# Patient Record
Sex: Female | Born: 1995 | Race: Black or African American | Hispanic: No | Marital: Single | Smoking: Former smoker
Health system: Southern US, Community
[De-identification: ages and names within clinical notes are randomized; demographics above are authoritative.]

## PROBLEM LIST (undated history)

## (undated) ENCOUNTER — Emergency Department (HOSPITAL_COMMUNITY): Admission: EM | Payer: Self-pay

## (undated) ENCOUNTER — Inpatient Hospital Stay (HOSPITAL_COMMUNITY): Payer: Self-pay

## (undated) DIAGNOSIS — D219 Benign neoplasm of connective and other soft tissue, unspecified: Secondary | ICD-10-CM

## (undated) DIAGNOSIS — Z8679 Personal history of other diseases of the circulatory system: Secondary | ICD-10-CM

## (undated) DIAGNOSIS — I4711 Inappropriate sinus tachycardia, so stated: Secondary | ICD-10-CM

## (undated) DIAGNOSIS — E282 Polycystic ovarian syndrome: Secondary | ICD-10-CM

## (undated) DIAGNOSIS — N390 Urinary tract infection, site not specified: Secondary | ICD-10-CM

## (undated) DIAGNOSIS — Z8759 Personal history of other complications of pregnancy, childbirth and the puerperium: Secondary | ICD-10-CM

## (undated) DIAGNOSIS — F32A Depression, unspecified: Secondary | ICD-10-CM

## (undated) DIAGNOSIS — R000 Tachycardia, unspecified: Secondary | ICD-10-CM

## (undated) HISTORY — DX: Depression, unspecified: F32.A

## (undated) HISTORY — DX: Benign neoplasm of connective and other soft tissue, unspecified: D21.9

## (undated) HISTORY — DX: Personal history of other diseases of the circulatory system: Z86.79

## (undated) HISTORY — DX: Personal history of other diseases of the circulatory system: Z87.59

## (undated) HISTORY — DX: Polycystic ovarian syndrome: E28.2

## (undated) HISTORY — PX: WISDOM TOOTH EXTRACTION: SHX21

## (undated) HISTORY — PX: CHOLECYSTECTOMY: SHX55

---
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
## 2015-09-17 DIAGNOSIS — A599 Trichomoniasis, unspecified: Secondary | ICD-10-CM

## 2015-09-17 HISTORY — DX: Trichomoniasis, unspecified: A59.9

## 2015-12-14 DIAGNOSIS — F4310 Post-traumatic stress disorder, unspecified: Secondary | ICD-10-CM | POA: Insufficient documentation

## 2015-12-14 HISTORY — DX: Post-traumatic stress disorder, unspecified: F43.10

## 2018-01-11 DIAGNOSIS — F339 Major depressive disorder, recurrent, unspecified: Secondary | ICD-10-CM | POA: Insufficient documentation

## 2020-07-05 DIAGNOSIS — R87612 Low grade squamous intraepithelial lesion on cytologic smear of cervix (LGSIL): Secondary | ICD-10-CM | POA: Insufficient documentation

## 2020-07-05 DIAGNOSIS — E282 Polycystic ovarian syndrome: Secondary | ICD-10-CM | POA: Insufficient documentation

## 2021-09-16 NOTE — L&D Delivery Note (Signed)
Delivery Note After a nearly 2 hour 2nd stage, at 10:01 PM a viable female was delivered via Vaginal, Spontaneous (Presentation: OA). The shoulders were not forthcoming, so the posterior (right) axilla was grasped with my index finger, and the baby was rotated clockwise into the oblique diameter.  At this point, the (now) anterior shoulder was released, and the baby delivered.  At no time was any traction placed on the baby's head.  APGAR: 2, 6, & 7.  weight 8 # 13 oz . After about 30 seconds, d/t the baby appearing to need extra stimulation, the cord was clamped and cut. 20 units of pitocin diluted in 500cc LR was infused rapidly IV.  The placenta separated spontaneously and delivered via CCT and maternal pushing effort.  It was inspected and appears to be intact with a 3 VC.  An in and out catheter was used to drain the bladder of ~ 100cc urine.   Cord pH: 7.11  Anesthesia: Epidural Episiotomy:  none Lacerations:  none Suture Repair: n/a Est. Blood Loss (mL):  308  Mom to postpartum.  Baby to NICU.  Scarlette Calico Cresenzo-Dishmon 08/26/2022, 10:19 PM

## 2022-01-10 ENCOUNTER — Inpatient Hospital Stay (HOSPITAL_COMMUNITY): Payer: Medicaid Other

## 2022-01-10 ENCOUNTER — Encounter (HOSPITAL_COMMUNITY): Payer: Self-pay | Admitting: Obstetrics and Gynecology

## 2022-01-10 ENCOUNTER — Inpatient Hospital Stay (HOSPITAL_COMMUNITY)
Admission: AD | Admit: 2022-01-10 | Discharge: 2022-01-10 | Disposition: A | Discharge status: Home / Self Care | Payer: Medicaid Other | Attending: Obstetrics and Gynecology | Admitting: Obstetrics and Gynecology

## 2022-01-10 DIAGNOSIS — O26891 Other specified pregnancy related conditions, first trimester: Secondary | ICD-10-CM | POA: Diagnosis present

## 2022-01-10 DIAGNOSIS — Z3A01 Less than 8 weeks gestation of pregnancy: Secondary | ICD-10-CM | POA: Insufficient documentation

## 2022-01-10 DIAGNOSIS — O2341 Unspecified infection of urinary tract in pregnancy, first trimester: Secondary | ICD-10-CM | POA: Insufficient documentation

## 2022-01-10 DIAGNOSIS — Z3490 Encounter for supervision of normal pregnancy, unspecified, unspecified trimester: Secondary | ICD-10-CM

## 2022-01-10 DIAGNOSIS — Z87440 Personal history of urinary (tract) infections: Secondary | ICD-10-CM | POA: Diagnosis not present

## 2022-01-10 LAB — CBC
HCT: 39.6 % (ref 36.0–46.0)
Hemoglobin: 13.9 g/dL (ref 12.0–15.0)
MCH: 30.1 pg (ref 26.0–34.0)
MCHC: 35.1 g/dL (ref 30.0–36.0)
MCV: 85.7 fL (ref 80.0–100.0)
Platelets: 349 10*3/uL (ref 150–400)
RBC: 4.62 MIL/uL (ref 3.87–5.11)
RDW: 12.3 % (ref 11.5–15.5)
WBC: 8.2 10*3/uL (ref 4.0–10.5)
nRBC: 0 % (ref 0.0–0.2)

## 2022-01-10 LAB — HCG, QUANTITATIVE, PREGNANCY: hCG, Beta Chain, Quant, S: 11266 m[IU]/mL — ABNORMAL HIGH (ref <5)

## 2022-01-10 LAB — ABO/RH: ABO/RH(D): B POS

## 2022-01-10 NOTE — MAU Note (Signed)
Kimberly Lewis is a 26 y.o. here in MAU reporting: went to UC on 4/25.  Found out she was preg.  Was told she UTI. Was told the medication might not work.  Was told to f/u to see if meds are working and an [place] to make sure not an ectopic preg.  Not really feeling the cramping now since on antibiotics, urinary symptoms have also improved.  Denies vag bleeding. ?LMP: 3/16 ?Onset of complaint: 2 wks ago cramping, frequency/urgency of preg.  ?Pain score: more of an irritant than pain ?Vitals:  ? 01/10/22 1212  ?BP: 124/61  ?Pulse: 60  ?Resp: 17  ?Temp: 99 ?F (37.2 ?C)  ?SpO2: 99%  ?   ?Lab orders placed from triage:  urine obtained, not ordered ?

## 2022-01-10 NOTE — Discharge Instructions (Signed)

## 2022-01-10 NOTE — MAU Provider Note (Signed)
?History  ?  ? ?CSN: [id number] ? ?Arrival date and time: 01/10/22 1129 ? ? Event Date/Time  ? First Provider Initiated Contact with Patient 01/10/22 1420   ?  ? ?Chief Complaint  ?Patient presents with  ? UTI f/u  ? ?HPI ?Kimberly Lewis is a 26 y.o. G1P0 at [redacted]w[redacted]d who presents to MAU with chief complaint of abdominal pain 2/2 recent UTI diagnosis. Patient endorses intermittent "sharp" abdominal pain. Most recent episode of pain was yesterday. She started the abx prescribed for her UTI yesterday. She denies vaginal bleeding, back pain, fever. ? ?OB History   ? ? Gravida  ?1  ? Para  ?   ? Term  ?   ? Preterm  ?   ? AB  ?   ? Living  ?   ?  ? ? SAB  ?   ? IAB  ?   ? Ectopic  ?   ? Multiple  ?   ? Live Births  ?   ?   ?  ?  ? ? ?No past medical history on file. ? ?No past surgical history on file. ? ?No family history on file. ? ?  ? ?Allergies: No Known Allergies ? ?Medications Prior to Admission  ?Medication Sig Dispense Refill Last Dose  ? albuterol (PROVENTIL,VENTOLIN) 90 MCG/ACT inhaler Inhale 2 puffs into the lungs 2 (two) times daily as needed. For shortness of breath ?      ? diphenhydrAMINE (BENADRYL) 25 MG tablet Take 25 mg by mouth 2 (two) times daily as needed. For allergic reaction      ? ? ?Review of Systems  ?Gastrointestinal:  Positive for abdominal pain.  ?All other systems reviewed and are negative. ?Physical Exam  ? ?Blood pressure 124/61, pulse 60, temperature 99 ?F (37.2 ?C), temperature source Oral, resp. rate 17, height 5\' 1"  (1.549 m), weight 87.5 kg, last menstrual period 11/29/2021, SpO2 99 %. ? ?Physical Exam ?Vitals and nursing note reviewed. Exam conducted with a chaperone present.  ?Constitutional:   ?   Appearance: Normal appearance.  ?Cardiovascular:  ?   Rate and Rhythm: Normal rate.  ?   Pulses: Normal pulses.  ?Pulmonary:  ?   Effort: Pulmonary effort is normal.  ?   Breath sounds: Normal breath sounds.  ?Abdominal:  ?   Tenderness: There is no abdominal tenderness. There is no left  CVA tenderness.  ?Skin: ?   Capillary Refill: Capillary refill takes less than 2 seconds.  ?Neurological:  ?   Mental Status: She is alert and oriented to person, place, and time.  ?Psychiatric:     ?   Mood and Affect: Mood normal.     ?   Behavior: Behavior normal.     ?   Thought Content: Thought content normal.     ?   Judgment: Judgment normal.  ? ? ?MAU Course  ?Procedures ? ?MDM ?Orders Placed This Encounter  ?Procedures  ? 12/01/2021 OB LESS THAN 14 WEEKS WITH OB TRANSVAGINAL  ? CBC  ? hCG, quantitative, pregnancy  ? ABO/Rh  ? Discharge patient  ? ?Patient Vitals for the past 24 hrs: ? BP Temp Temp src Pulse Resp SpO2 Height Weight  ?01/10/22 1212 124/61 99 ?F (37.2 ?C) Oral 60 17 99 % 5\' 1"  (1.549 m) 87.5 kg  ? ?Results for orders placed or performed during the hospital encounter of 01/10/22 (from the past 24 hour(s))  ?CBC     Status: None  ? Collection Time: 01/10/22  12:43 PM  ?Result Value Ref Range  ? WBC 8.2 4.0 - 10.5 K/uL  ? RBC 4.62 3.87 - 5.11 MIL/uL  ? Hemoglobin 13.9 12.0 - 15.0 g/dL  ? HCT 39.6 36.0 - 46.0 %  ? MCV 85.7 80.0 - 100.0 fL  ? MCH 30.1 26.0 - 34.0 pg  ? MCHC 35.1 30.0 - 36.0 g/dL  ? RDW 12.3 11.5 - 15.5 %  ? Platelets 349 150 - 400 K/uL  ? nRBC 0.0 0.0 - 0.2 %  ?hCG, quantitative, pregnancy     Status: Abnormal  ? Collection Time: 01/10/22 12:43 PM  ?Result Value Ref Range  ? hCG, Beta Chain, Quant, S 11,266 (H) <5 mIU/mL  ?ABO/Rh     Status: None  ? Collection Time: 01/10/22 12:43 PM  ?Result Value Ref Range  ? ABO/RH(D) B POS   ? No rh immune globuloin    ?  NOT A RH IMMUNE GLOBULIN CANDIDATE, PT RH POSITIVE ?Performed at [facility], [address], [place], [place] [address] ?  ? ?US OB LESS THAN 14 WEEKS WITH OB TRANSVAGINAL ? ?Result Date: 01/10/2022 ?CLINICAL DATA:  Pregnancy.  Abdominal cramping EXAM: OBSTETRIC <14 WK [place] AND TRANSVAGINAL OB US TECHNIQUE: Both transabdominal and transvaginal ultrasound examinations were performed for complete evaluation of the gestation as well  as the maternal uterus, adnexal regions, and pelvic cul-de-sac. Transvaginal technique was performed to assess early pregnancy. COMPARISON:  None. FINDINGS: Intrauterine gestational sac: Single Yolk sac:  Visualized. Embryo:  Not Visualized. Cardiac Activity: Not Visualized. MSD: 8.4 mm   5 w   4 d Subchorionic hemorrhage:  None visualized. Maternal uterus/adnexae: Bilateral ovaries within normal limits. No adnexal abnormality. No free fluid within the pelvis. IMPRESSION: Early intrauterine gestational sac containing yolk sac. No embryo is visualized at this time. By mean sac diameter, pregnancy measures 5 weeks 4 days. Recommend follow-up quantitative B-HCG levels and follow-up US in 14 days to assess viability. This recommendation follows SRU consensus guidelines: Diagnostic Criteria for Nonviable Pregnancy Early in the First Trimester. Malva Limes Med 2013; 224:8250-03. Electronically Signed   By: Duanne Guess D.O.   On: 01/10/2022 14:14   ? ?Assessment and Plan  ?--26 y.o. G1P0 with confirmed IUP (+ GS, + YS) ?--PNV prescribed by outside provider ?--Finish abx for UTI as previously prescribed ?--Discharge home in stable condition ? ?Calvert Cantor, CNM ?01/10/2022, 3:19 PM  ?

## 2022-01-13 ENCOUNTER — Inpatient Hospital Stay (HOSPITAL_COMMUNITY)
Admission: AD | Admit: 2022-01-13 | Discharge: 2022-01-13 | Disposition: A | Discharge status: Home / Self Care | Payer: Medicaid Other | Attending: Obstetrics & Gynecology | Admitting: Obstetrics & Gynecology

## 2022-01-13 DIAGNOSIS — Z3401 Encounter for supervision of normal first pregnancy, first trimester: Secondary | ICD-10-CM | POA: Diagnosis not present

## 2022-01-13 DIAGNOSIS — Z3491 Encounter for supervision of normal pregnancy, unspecified, first trimester: Secondary | ICD-10-CM | POA: Insufficient documentation

## 2022-01-13 DIAGNOSIS — Z711 Person with feared health complaint in whom no diagnosis is made: Secondary | ICD-10-CM

## 2022-01-13 DIAGNOSIS — Z3A01 Less than 8 weeks gestation of pregnancy: Secondary | ICD-10-CM | POA: Diagnosis not present

## 2022-01-13 NOTE — MAU Note (Signed)
Kimberly Lewis is a 26 y.o. at [redacted]w[redacted]d here in MAU reporting: went to ER yesterday in [place].  For stomach cramping and irritation.  Was told to come in have another [place] , because they weren't sure but they thought she was having a miscarriage. cramping has now stopped, no bleeding.  ?Currently on meds for UTI, also found out she had trich- was treated.  ?Onset of complaint: ? Follow up ?Pain score: no ?Vitals:  ? 01/13/22 1539  ?BP: 119/78  ?Pulse: 76  ?Resp: 18  ?Temp: 99 ?F (37.2 ?C)  ?SpO2: 99%  ?   ? ?Lab orders placed from triage:  none ?

## 2022-01-13 NOTE — MAU Provider Note (Signed)
Event Date/Time  ? First Provider Initiated Contact with Patient 01/13/22 1545   ?  ? ?S ?[name] is a 26 y.o. G1P0 patient who presents to MAU today with complaint requesting follow up from her emergency room visit. She states she was seen in the ER in [place] yesterday and told to follow up with OB due to a pregnancy of uncertain viability. She denies any pain or bleeding today.  ? ?O ?BP 119/78 (BP Location: Right Arm)   Pulse 76   Temp 99 ?F (37.2 ?C) (Oral)   Resp 18   LMP 11/29/2021   SpO2 99%  ?Physical Exam ?Vitals and nursing note reviewed.  ?Constitutional:   ?   General: She is not in acute distress. ?   Appearance: She is well-developed.  ?HENT:  ?   Head: Normocephalic.  ?Eyes:  ?   Pupils: Pupils are equal, round, and reactive to light.  ?Cardiovascular:  ?   Rate and Rhythm: Normal rate and regular rhythm.  ?   Heart sounds: Normal heart sounds.  ?Pulmonary:  ?   Effort: Pulmonary effort is normal. No respiratory distress.  ?   Breath sounds: Normal breath sounds.  ?Abdominal:  ?   General: Bowel sounds are normal. There is no distension.  ?   Palpations: Abdomen is soft.  ?   Tenderness: There is no abdominal tenderness.  ?Skin: ?   General: Skin is warm and dry.  ?Neurological:  ?   Mental Status: She is alert and oriented to person, place, and time.  ?Psychiatric:     ?   Mood and Affect: Mood normal.     ?   Behavior: Behavior normal.     ?   Thought Content: Thought content normal.     ?   Judgment: Judgment normal.  ? ? ?A ?Medical screening exam complete ?1. Normal intrauterine pregnancy on prenatal ultrasound in first trimester   ?2. [redacted] weeks gestation of pregnancy   ?3. Physically well but worried   ? ?Review of records showed appropriate growth in pregnancy between ultrasound on 4/27 and 4/29. Reassurance provided to patient and appropriate follow up reviewed. Patient agreeable to plan of care ? ?P ?Discharge from MAU in stable condition ?Order placed for outpatient  ultrasound in 10 days. ?Warning signs for worsening condition that would warrant emergency follow-up discussed ?Patient may return to MAU as needed  ? ?Rolm Bookbinder, CNM ?01/13/2022 3:45 PM   ?

## 2022-01-13 NOTE — Discharge Instructions (Signed)

## 2022-01-24 ENCOUNTER — Ambulatory Visit (HOSPITAL_COMMUNITY)
Admission: RE | Admit: 2022-01-24 | Discharge: 2022-01-24 | Disposition: A | Discharge status: Home / Self Care | Payer: Medicaid Other | Source: Ambulatory Visit

## 2022-01-24 DIAGNOSIS — O3481 Maternal care for other abnormalities of pelvic organs, first trimester: Secondary | ICD-10-CM | POA: Insufficient documentation

## 2022-01-24 DIAGNOSIS — N8311 Corpus luteum cyst of right ovary: Secondary | ICD-10-CM | POA: Diagnosis not present

## 2022-01-24 DIAGNOSIS — Z3689 Encounter for other specified antenatal screening: Secondary | ICD-10-CM | POA: Diagnosis present

## 2022-01-24 DIAGNOSIS — Z3A01 Less than 8 weeks gestation of pregnancy: Secondary | ICD-10-CM | POA: Insufficient documentation

## 2022-01-24 DIAGNOSIS — Z3491 Encounter for supervision of normal pregnancy, unspecified, first trimester: Secondary | ICD-10-CM | POA: Insufficient documentation

## 2022-01-26 ENCOUNTER — Telehealth: Payer: Self-pay

## 2022-01-26 NOTE — Telephone Encounter (Signed)
Attempted to call patient to review ultrasound results with no answer. HIPPA compliant message left and will attempt to call patient back at another time. ? ?Wende Mott, CNM ?01/26/22 ?8:58 AM ? ?

## 2022-01-27 ENCOUNTER — Encounter (HOSPITAL_COMMUNITY): Payer: Self-pay | Admitting: Obstetrics & Gynecology

## 2022-01-27 ENCOUNTER — Inpatient Hospital Stay (HOSPITAL_COMMUNITY)
Admission: AD | Admit: 2022-01-27 | Discharge: 2022-01-28 | Disposition: A | Discharge status: Home / Self Care | Payer: Medicaid Other | Attending: Obstetrics & Gynecology | Admitting: Obstetrics & Gynecology

## 2022-01-27 DIAGNOSIS — R9431 Abnormal electrocardiogram [ECG] [EKG]: Secondary | ICD-10-CM | POA: Insufficient documentation

## 2022-01-27 DIAGNOSIS — R42 Dizziness and giddiness: Secondary | ICD-10-CM | POA: Diagnosis not present

## 2022-01-27 DIAGNOSIS — O26891 Other specified pregnancy related conditions, first trimester: Secondary | ICD-10-CM | POA: Diagnosis present

## 2022-01-27 DIAGNOSIS — O219 Vomiting of pregnancy, unspecified: Secondary | ICD-10-CM | POA: Diagnosis not present

## 2022-01-27 DIAGNOSIS — Z3A08 8 weeks gestation of pregnancy: Secondary | ICD-10-CM | POA: Diagnosis not present

## 2022-01-27 DIAGNOSIS — R519 Headache, unspecified: Secondary | ICD-10-CM | POA: Diagnosis not present

## 2022-01-27 HISTORY — DX: Tachycardia, unspecified: R00.0

## 2022-01-27 LAB — URINALYSIS, ROUTINE W REFLEX MICROSCOPIC
Bilirubin Urine: NEGATIVE
Glucose, UA: NEGATIVE mg/dL
Hgb urine dipstick: NEGATIVE
Ketones, ur: 20 mg/dL — AB
Leukocytes,Ua: NEGATIVE
Nitrite: NEGATIVE
Protein, ur: NEGATIVE mg/dL
Specific Gravity, Urine: 1.015 (ref 1.005–1.030)
pH: 6 (ref 5.0–8.0)

## 2022-01-27 LAB — CBC WITH DIFFERENTIAL/PLATELET
Abs Immature Granulocytes: 0.03 10*3/uL (ref 0.00–0.07)
Basophils Absolute: 0 10*3/uL (ref 0.0–0.1)
Basophils Relative: 1 %
Eosinophils Absolute: 0.1 10*3/uL (ref 0.0–0.5)
Eosinophils Relative: 1 %
HCT: 42.9 % (ref 36.0–46.0)
Hemoglobin: 15.3 g/dL — ABNORMAL HIGH (ref 12.0–15.0)
Immature Granulocytes: 0 %
Lymphocytes Relative: 30 %
Lymphs Abs: 2.6 10*3/uL (ref 0.7–4.0)
MCH: 29.8 pg (ref 26.0–34.0)
MCHC: 35.7 g/dL (ref 30.0–36.0)
MCV: 83.6 fL (ref 80.0–100.0)
Monocytes Absolute: 0.7 10*3/uL (ref 0.1–1.0)
Monocytes Relative: 8 %
Neutro Abs: 5.2 10*3/uL (ref 1.7–7.7)
Neutrophils Relative %: 60 %
Platelets: 384 10*3/uL (ref 150–400)
RBC: 5.13 MIL/uL — ABNORMAL HIGH (ref 3.87–5.11)
RDW: 11.9 % (ref 11.5–15.5)
WBC: 8.7 10*3/uL (ref 4.0–10.5)
nRBC: 0 % (ref 0.0–0.2)

## 2022-01-27 LAB — COMPREHENSIVE METABOLIC PANEL
ALT: 25 U/L (ref 0–44)
AST: 23 U/L (ref 15–41)
Albumin: 4.3 g/dL (ref 3.5–5.0)
Alkaline Phosphatase: 57 U/L (ref 38–126)
Anion gap: 9 (ref 5–15)
BUN: 5 mg/dL — ABNORMAL LOW (ref 6–20)
CO2: 22 mmol/L (ref 22–32)
Calcium: 10 mg/dL (ref 8.9–10.3)
Chloride: 105 mmol/L (ref 98–111)
Creatinine, Ser: 0.8 mg/dL (ref 0.44–1.00)
GFR, Estimated: >60 mL/min (ref >60)
Glucose, Bld: 81 mg/dL (ref 70–99)
Potassium: 3.6 mmol/L (ref 3.5–5.1)
Sodium: 136 mmol/L (ref 135–145)
Total Bilirubin: 0.8 mg/dL (ref 0.3–1.2)
Total Protein: 8.4 g/dL — ABNORMAL HIGH (ref 6.5–8.1)

## 2022-01-27 LAB — MAGNESIUM: Magnesium: 2 mg/dL (ref 1.7–2.4)

## 2022-01-27 MED ORDER — FAMOTIDINE IN NACL 20-0.9 MG/50ML-% IV SOLN
20.0000 mg | Freq: Once | INTRAVENOUS | Status: AC
  Administered 2022-01-27: 20 mg via INTRAVENOUS
  Filled 2022-01-27: qty 50

## 2022-01-27 MED ORDER — LACTATED RINGERS IV BOLUS
1000.0000 mL | Freq: Once | INTRAVENOUS | Status: AC
  Administered 2022-01-27: 1000 mL via INTRAVENOUS

## 2022-01-27 MED ORDER — SODIUM CHLORIDE 0.9 % IV SOLN
12.5000 mg | Freq: Once | INTRAVENOUS | Status: AC
  Administered 2022-01-27: 12.5 mg via INTRAVENOUS
  Filled 2022-01-27: qty 0.5

## 2022-01-27 NOTE — MAU Note (Signed)
Kimberly Lewis is a 26 y.o. at [redacted]w[redacted]d here in MAU reporting: C/O nausea and mid to right sided abdominal pain. No bleeding. U/S showed possible cyst on ovary.  ?LMP: 11/29/21 ?Onset of complaint: 4 days ago ?Pain score: 6/10 ?Vitals:  ? 01/27/22 1944 01/27/22 1945  ?BP:  132/65  ?Pulse:  75  ?Resp:  18  ?Temp:  99.1 ?F (37.3 ?C)  ?SpO2: 100% 99%  ?   ?FHT: ?Lab orders placed from triage:  ?Urinalysis ? ?

## 2022-01-27 NOTE — MAU Provider Note (Signed)
?History  ?  ? ?CSN: [id number] ? ?Arrival date and time: 01/27/22 1917 ? ? Event Date/Time  ? First Provider Initiated Contact with Patient 01/27/22 2039   ?  ? ?Chief Complaint  ?Patient presents with  ? Abdominal Pain  ? Nausea  ? ?[name] is a 26 y.o. G2P1001 at [redacted]w[redacted]d who presents to MAU for nausea. Patient reports she has been nauseous for weeks and states that she has been nauseous since she found out she was pregnant. Patient reports the nausea has worsened recently and reports she last vomited yesterday and reports vomiting twice daily. Patient reports she has tried ginger ale and Sprite for treatment, but no medications, and these did not work for her. Patient reports she is drinking water and orange juice, but can "barely eat" and is able to eat fruit, including banana, pineapples, tortilla chips. ? ?Patient reports she is "always dizzy" and that has been going on since she found out she was pregnant as well. Patient reports she is dizzy constantly, even when lying down, but states she does feel better when lying down. Patient reports it is worse when standing up. Patient reports she has experienced dizziness like this before at the start of her heart problems and when she had a magnesium deficiency. 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 Patient reports the dizziness with her heart problem was "way worse" than it is now. Patient reports she has not taken any medications for the dizziness. Patient reports she feels like she is dizzy because she is dehydrated because she has not been eating and drinking as much as normal. Patient reports the dizziness got worse as the nausea got worse. Patient reports she is experiencing the dizziness like she is rocking on a boat. Patient also endorses a HA at this time, which started 4 days ago. Patient reports she does get headaches a lot, but states  that when she gets her usual headaches they are a lot worse then they are now, and states this headache feels more like tension. Patient rates HA as 5/10 at this time, and patient has not taken anything for the headache. ? ?Patient's partner present for entire visit. ? ?Pt denies VB, vaginal discharge/odor/itching. ?Pt denies abdominal pain, constipation, diarrhea, or urinary problems. ?Pt denies fever, chills, fatigue, sweating or changes in appetite. ?Pt denies SOB or chest pain. ?Pt denies light-headedness, weakness. ? ?Problems this pregnancy include: pt has not yet been seen. ?Allergies? Aspirin (causes vomiting) ?Current medications/supplements? PNV ?Prenatal care provider? [facility], appt 01/30/2022 ? ? ?OB History   ? ? Gravida  ?2  ? Para  ?1  ? Term  ?1  ? Preterm  ?0  ? AB  ?0  ? Living  ?1  ?  ? ? SAB  ?0  ? IAB  ?0  ? Ectopic  ?0  ? Multiple  ?0  ? Live Births  ?1  ?   ?  ?  ? ? ?Past Medical History:  ?Diagnosis Date  ? Tachycardia   ? was taken off medication due to pregnancy-naldolol  ? Trichomonas infection 2017  ? ? ?Past Surgical History:  ?Procedure Laterality Date  ? WISDOM TOOTH EXTRACTION Bilateral   ? ? ?Family History  ?Problem Relation Age of Onset  ? Hypertension Maternal Grandmother   ? ? ?Social History  ? ?Tobacco Use  ? Smoking status: Former  ?  Types: Cigarettes  ?  Passive exposure: Past  ?Vaping Use  ? Vaping Use: Never used  ?Substance Use Topics  ? Alcohol use: Not Currently  ? Drug use: Not Currently  ? ? ?Allergies:  ?Allergies  ?Allergen Reactions  ? Aspirin Nausea And Vomiting  ? ? ?Medications Prior to Admission  ?Medication Sig Dispense Refill Last Dose  ? Prenatal Vit-Fe Fumarate-FA (PRENATAL VITAMIN PO) Take 1 tablet by mouth daily.   01/27/2022 at 1500  ? albuterol (PROVENTIL,VENTOLIN) 90 MCG/ACT inhaler Inhale 2 puffs into the lungs 2 (two) times daily as needed. For shortness of breath ?  (Patient not taking: Reported on 01/27/2022)   Not Taking  ? diphenhydrAMINE  (BENADRYL) 25 MG tablet Take 25 mg by mouth 2 (two) times daily as needed. For allergic reaction  (Patient not taking: Reported on 01/27/2022)   Not Taking  ? ? ?Review of Systems  ?Constitutional:  Negative for chills, diaphoresis, fatigue and fever.  ?Eyes:  Negative for visual disturbance.  ?Respiratory:  Negative for shortness of breath.   ?Cardiovascular:  Negative for chest pain.  ?Gastrointestinal:  Positive for nausea and vomiting. Negative for abdominal pain, constipation and diarrhea.  ?Genitourinary:  Negative for dysuria, flank pain, frequency, pelvic pain, urgency, vaginal bleeding and vaginal discharge.  ?Neurological:  Positive for dizziness and headaches. Negative for weakness and light-headedness.  ? ?Physical Exam  ? ?Blood pressure 132/65, pulse 75, temperature 99.1 ?F (37.3 ?C), temperature source Oral, resp. rate 18, height 5\' 7"  (1.702 m), weight 83.6 kg, last menstrual period 11/29/2021, SpO2 99 %. ? ?Patient Vitals for the past 24 hrs: ? BP Temp Temp src Pulse Resp SpO2 Height Weight  ?01/27/22 1945 132/65 99.1 ?F (37.3 ?C) Oral 75 18 99 % 5\' 7"  (1.702 m) 83.6 kg  ?01/27/22 1944 -- -- -- -- -- 100 % -- --  ? ?Physical Exam ?Vitals and nursing note reviewed.  ?Constitutional:   ?   General: She is not in acute distress. ?   Appearance: Normal appearance. She is not ill-appearing, toxic-appearing or diaphoretic.  ?HENT:  ?   Head: Normocephalic and atraumatic.  ?Pulmonary:  ?   Effort: Pulmonary effort is normal.  ?Neurological:  ?   Mental Status: She is alert and oriented to person, place, and time.  ?Psychiatric:     ?   Mood and Affect: Mood normal.     ?   Behavior: Behavior normal.     ?   Thought Content: Thought content normal.     ?   Judgment: Judgment normal.  ? ?Results for orders placed or performed during the hospital encounter of 01/27/22 (from the past 24 hour(s))  ?Urinalysis, Routine w reflex microscopic Urine, Clean Catch     Status: Abnormal  ? Collection Time: 01/27/22  8:31  PM  ?Result Value Ref Range  ? Color, Urine AMBER (A) YELLOW  ? APPearance CLOUDY (A) CLEAR  ? Specific Gravity, Urine 1.015 1.005 - 1.030  ? pH 6.0 5.0 - 8.0  ? Glucose, UA NEGATIVE NEGATIVE mg/dL  ? Hgb urine dipstick NEGATIVE NEGATIVE  ? Bilirubin Urine NEGATIVE NEGATIVE  ? Ketones, ur 20 (A) NEGATIVE mg/dL  ? Protein, ur NEGATIVE NEGATIVE mg/dL  ? Nitrite NEGATIVE NEGATIVE  ? Leukocytes,Ua NEGATIVE NEGATIVE  ?CBC with Differential/Platelet     Status: Abnormal  ? Collection Time: 01/27/22  9:33 PM  ?Result Value Ref Range  ? WBC 8.7 4.0 - 10.5 K/uL  ? RBC 5.13 (H) 3.87 -  5.11 MIL/uL  ? Hemoglobin 15.3 (H) 12.0 - 15.0 g/dL  ? HCT 42.9 36.0 - 46.0 %  ? MCV 83.6 80.0 - 100.0 fL  ? MCH 29.8 26.0 - 34.0 pg  ? MCHC 35.7 30.0 - 36.0 g/dL  ? RDW 11.9 11.5 - 15.5 %  ? Platelets 384 150 - 400 K/uL  ? nRBC 0.0 0.0 - 0.2 %  ? Neutrophils Relative % 60 %  ? Neutro Abs 5.2 1.7 - 7.7 K/uL  ? Lymphocytes Relative 30 %  ? Lymphs Abs 2.6 0.7 - 4.0 K/uL  ? Monocytes Relative 8 %  ? Monocytes Absolute 0.7 0.1 - 1.0 K/uL  ? Eosinophils Relative 1 %  ? Eosinophils Absolute 0.1 0.0 - 0.5 K/uL  ? Basophils Relative 1 %  ? Basophils Absolute 0.0 0.0 - 0.1 K/uL  ? Immature Granulocytes 0 %  ? Abs Immature Granulocytes 0.03 0.00 - 0.07 K/uL  ?Comprehensive metabolic panel     Status: Abnormal  ? Collection Time: 01/27/22  9:33 PM  ?Result Value Ref Range  ? Sodium 136 135 - 145 mmol/L  ? Potassium 3.6 3.5 - 5.1 mmol/L  ? Chloride 105 98 - 111 mmol/L  ? CO2 22 22 - 32 mmol/L  ? Glucose, Bld 81 70 - 99 mg/dL  ? BUN 5 (L) 6 - 20 mg/dL  ? Creatinine, Ser 0.80 0.44 - 1.00 mg/dL  ? Calcium 10.0 8.9 - 10.3 mg/dL  ? Total Protein 8.4 (H) 6.5 - 8.1 g/dL  ? Albumin 4.3 3.5 - 5.0 g/dL  ? AST 23 15 - 41 U/L  ? ALT 25 0 - 44 U/L  ? Alkaline Phosphatase 57 38 - 126 U/L  ? Total Bilirubin 0.8 0.3 - 1.2 mg/dL  ? GFR, Estimated >60 >60 mL/min  ? Anion gap 9 5 - 15  ?Magnesium     Status: None  ? Collection Time: 01/27/22  9:33 PM  ?Result Value Ref Range   ? Magnesium 2.0 1.7 - 2.4 mg/dL  ? ?US OB Transvaginal ? ?Result Date: 01/26/2022 ?CLINICAL DATA:  Initial evaluation for early pregnancy, assess viability. EXAM: TRANSVAGINAL OB ULTRASOUND TECHNIQUE: Armond Hang

## 2022-01-28 DIAGNOSIS — O26891 Other specified pregnancy related conditions, first trimester: Secondary | ICD-10-CM | POA: Diagnosis not present

## 2022-01-28 DIAGNOSIS — R9431 Abnormal electrocardiogram [ECG] [EKG]: Secondary | ICD-10-CM | POA: Diagnosis not present

## 2022-01-28 DIAGNOSIS — O219 Vomiting of pregnancy, unspecified: Secondary | ICD-10-CM

## 2022-01-28 DIAGNOSIS — Z3A08 8 weeks gestation of pregnancy: Secondary | ICD-10-CM | POA: Diagnosis not present

## 2022-01-28 DIAGNOSIS — R42 Dizziness and giddiness: Secondary | ICD-10-CM | POA: Diagnosis not present

## 2022-01-28 MED ORDER — FAMOTIDINE 20 MG PO TABS: 20.0000 mg | ORAL_TABLET | Freq: Every day | ORAL | 1 refills | Status: DC

## 2022-01-28 MED ORDER — PROMETHAZINE HCL 12.5 MG PO TABS: 12.5000 mg | ORAL_TABLET | Freq: Four times a day (QID) | ORAL | 1 refills | Status: DC | PRN

## 2022-01-28 NOTE — MAU Note (Signed)
EKG completed

## 2022-01-28 NOTE — MAU Note (Signed)
Pt reports headache and nausea has resolved asking for additional crackers and gingerale. Has tolerated gingerale and crackers. ?

## 2022-01-28 NOTE — Discharge Instructions (Signed)
Prenatal Care Providers ? ?         ?[facility] - accepts patients without insurance ? Phone: [phone] ? ?[facility] @ [facility]  ? Phone: [phone] ? ?[facility]      ? Phone: [phone]   ?         ?[facility] @ [place]    ? Phone: [phone] ?         ?[facility] @ [place]  ? Phone: [phone] ? ?[facility] @ [facility] - accepts patients without insurance ? Phone: [phone] ? ?[facility] @ [facility] ?Phone: [phone] ?    ?[facility] - accepts patients without insurance ?Phone: [phone] ? ?[facility]  ?Phone: [phone] ? ?[facility] ?Phone: [phone] ? ?[facility] ?Phone: [phone] ? ?[facility] ?Phone: [phone] ? ?[facility] ?Phone: [phone] ? ?[facility]  ?Phone: [phone] ? ? ? ? ? ? ?                 Safe Medications in Pregnancy  ? ? ?Acne: ?Benzoyl Peroxide ?Salicylic Acid ? ?Backache/Headache: ?Tylenol: 2 regular strength every 4 hours OR ?             2 Extra strength every 6 hours ? ?Colds/Coughs/Allergies: ?Benadryl (alcohol free) 25 mg every 6 hours as needed ?Breath right strips ?Claritin ?Cepacol throat lozenges ?Chloraseptic throat spray ?Cold-Eeze- up to three times per day ?Cough drops, alcohol free ?Flonase (by prescription only) ?Guaifenesin ?Mucinex ?Robitussin DM (plain only, alcohol free) ?Saline nasal spray/drops ?Sudafed (pseudoephedrine) & Actifed ** use only after [redacted] weeks gestation and if you do not have high blood pressure ?Tylenol ?Vicks Vaporub ?Zinc lozenges ?Zyrtec  ? ?Constipation: ?Colace ?Ducolax suppositories ?Fleet enema ?Glycerin suppositories ?Metamucil ?Milk of magnesia ?Miralax ?Senokot ?Smooth move tea ? ?Diarrhea: ?Kaopectate ?Imodium A-D ? ?*NO pepto  Bismol ? ?Hemorrhoids: ?Anusol ?Anusol HC ?Preparation H ?Tucks ? ?Indigestion: ?Tums ?Maalox ?Mylanta ?Zantac  ?Pepcid ? ?Insomnia: ?Benadryl (alcohol free) 25mg every 6 hours as needed ?Tylenol PM ?Unisom, no Gelcaps ? ?Leg Cramps: ?Tums ?MagGel ? ?Nausea/Vomiting:  ?Bonine ?Dramamine ?Emetrol ?Ginger extract ?Sea bands ?Meclizine  ?Nausea medication to take during pregnancy:  ?Unisom (doxylamine succinate 25 mg tablets) Take one tablet daily at bedtime. If symptoms are not adequately controlled, the dose can be increased to a maximum recommended dose of two tablets daily (1/2 tablet in the morning, 1/2 tablet mid-afternoon and one at bedtime). ?Vitamin B6 100mg tablets. Take one tablet twice a day (up to 200 mg per day). ? ?Skin Rashes: ?Aveeno products ?Benadryl cream or 25mg every 6 hours as needed ?Calamine Lotion ?1% cortisone cream ? ?Yeast infection: ?Gyne-lotrimin 7 ?Monistat 7 ? ? ?**If taking multiple medications, please check labels to avoid duplicating the same active ingredients ?**take medication as directed on the label ?** Do not exceed 4000 mg of tylenol in 24 hours ?**Do not take medications that contain aspirin or ibuprofen ? ? ? ? ? ? ? ? ? ?

## 2022-01-31 ENCOUNTER — Ambulatory Visit (INDEPENDENT_AMBULATORY_CARE_PROVIDER_SITE_OTHER): Payer: Medicaid Other | Admitting: Cardiology

## 2022-01-31 ENCOUNTER — Encounter: Payer: Self-pay | Admitting: Cardiology

## 2022-01-31 VITALS — BP 127/79 | HR 81 | Ht 61.0 in | Wt 185.2 lb

## 2022-01-31 DIAGNOSIS — Z3A00 Weeks of gestation of pregnancy not specified: Secondary | ICD-10-CM

## 2022-01-31 DIAGNOSIS — R000 Tachycardia, unspecified: Secondary | ICD-10-CM

## 2022-01-31 DIAGNOSIS — O99419 Diseases of the circulatory system complicating pregnancy, unspecified trimester: Secondary | ICD-10-CM

## 2022-01-31 DIAGNOSIS — Z3A08 8 weeks gestation of pregnancy: Secondary | ICD-10-CM

## 2022-01-31 MED ORDER — METOPROLOL SUCCINATE ER 25 MG PO TB24: 12.5000 mg | ORAL_TABLET | Freq: Every day | ORAL | 3 refills | Status: DC

## 2022-01-31 NOTE — Patient Instructions (Signed)
Medication Instructions:  Your physician has recommended you make the following change in your medication:  START: Toprol-XL 12.5 mg at night *If you need a refill on your cardiac medications before your next appointment, please call your pharmacy*   Lab Work: None If you have labs (blood work) drawn today and your tests are completely normal, you will receive your results only by: [place] (if you have MyChart) OR A paper copy in the mail If you have any lab test that is abnormal or we need to change your treatment, we will call you to review the results.   Testing/Procedures: None   Follow-Up: At [facility], you and your health needs are our priority.  As part of our continuing mission to provide you with exceptional heart care, we have created designated Provider Care Teams.  These Care Teams include your primary Cardiologist (physician) and Advanced Practice Providers (APPs -  Physician Assistants and Nurse Practitioners) who all work together to provide you with the care you need, when you need it.  We recommend signing up for the patient portal called "MyChart".  Sign up information is provided on this After Visit Summary.  MyChart is used to connect with patients for Virtual Visits (Telemedicine).  Patients are able to view lab/test results, encounter notes, upcoming appointments, etc.  Non-urgent messages can be sent to your provider as well.   To learn more about what you can do with MyChart, go to [email or web address].    Your next appointment:   8 week(s)  The format for your next appointment:   In Person  Provider:   Berniece Salines, DO    Other Instructions   Important Information About Sugar

## 2022-01-31 NOTE — Progress Notes (Signed)
Cardio-Obstetrics Clinic  New Evaluation  Date:  02/01/2022   ID:  [name], DOB [birth year], MRN [id number]  PCP:  Pcp, No   [facility] Providers Cardiologist:  None  Electrophysiologist:  None       Referring MD: Marylen Ponto, NP   Chief Complaint: " I am having lots of palpitations"  History of Present Illness:    Kimberly Lewis is a 26 y.o. female [G2P1001] who is being seen today for the evaluation of palpitations at the request of Nugent, Odie Sera, NP.   She has a history of inappropriate sinus tachycardia for evaluation of palpitations.  The patient tells me that she was diagnosed many S1 [place]ner [place] a sinus tachycardia and she had been managed on nadolol.  Since getting pregnant her nadolol had been stopped.  Since that recently she has been having increasing palpitations which has been very uncomfortable for the patient .  She has had several dizziness with the palpitations.  No syncope episode.   Prior CV Studies Reviewed: The following studies were reviewed today:   Past Medical History:  Diagnosis Date   Tachycardia    was taken off medication due to pregnancy-naldolol   Trichomonas infection 2017    Past Surgical History:  Procedure Laterality Date   WISDOM TOOTH EXTRACTION Bilateral       OB History     Gravida  2   Para  1   Term  1   Preterm  0   AB  0   Living  1      SAB  0   IAB  0   Ectopic  0   Multiple  0   Live Births  1               Current Medications: Current Meds  Medication Sig   famotidine (PEPCID) 20 MG tablet Take 1 tablet (20 mg total) by mouth daily.   metoprolol succinate (TOPROL XL) 25 MG 24 hr tablet Take 0.5 tablets (12.5 mg total) by mouth daily.   Prenatal Vit-Fe Fumarate-FA (PRENATAL VITAMIN PO) Take 1 tablet by mouth daily.   promethazine (PHENERGAN) 12.5 MG tablet Take 1 tablet (12.5 mg total) by mouth every 6 (six) hours as needed for nausea or vomiting.     Allergies:    Aspirin   Social History   Socioeconomic History   Marital status: Single    Spouse name: Not on file   Number of children: Not on file   Years of education: Not on file   Highest education level: Not on file  Occupational History   Not on file  Tobacco Use   Smoking status: Former    Types: Cigarettes    Passive exposure: Past   Smokeless tobacco: Not on file  Vaping Use   Vaping Use: Never used  Substance and Sexual Activity   Alcohol use: Not Currently   Drug use: Not Currently   Sexual activity: Yes    Birth control/protection: Other-see comments  Other Topics Concern   Not on file  Social History Narrative   Not on file   Social Determinants of Health   Financial Resource Strain: Not on file  Food Insecurity: Not on file  Transportation Needs: No Transportation Needs   Lack of Transportation (Medical): No   Lack of Transportation (Non-Medical): No  Physical Activity: Not on file  Stress: Not on file  Social Connections: Not on file      Family History  Problem  Relation Age of Onset   Hypertension Maternal Grandmother       ROS:   Review of Systems  Constitution: Negative for decreased appetite, fever and weight gain.  HENT: Negative for congestion, ear discharge, hoarse voice and sore throat.   Eyes: Negative for discharge, redness, vision loss in right eye and visual halos.  Cardiovascular: Reports palpitations.  Negative for chest pain, dyspnea on exertion, leg swelling, orthopnea. Respiratory: Negative for cough, hemoptysis, shortness of breath and snoring.   Endocrine: Negative for heat intolerance and polyphagia.  Hematologic/Lymphatic: Negative for bleeding problem. Does not bruise/bleed easily.  Skin: Negative for flushing, nail changes, rash and suspicious lesions.  Musculoskeletal: Negative for arthritis, joint pain, muscle cramps, myalgias, neck pain and stiffness.  Gastrointestinal: Negative for abdominal pain, bowel incontinence, diarrhea and  excessive appetite.  Genitourinary: Negative for decreased libido, genital sores and incomplete emptying.  Neurological: Negative for brief paralysis, focal weakness, headaches and loss of balance.  Psychiatric/Behavioral: Negative for altered mental status, depression and suicidal ideas.  Allergic/Immunologic: Negative for HIV exposure and persistent infections.     Labs/EKG Reviewed:    EKG:   EKG is was ordered today.  The ekg ordered today demonstrates sinus rhythm, heart rate 81 bpm.  Recent Labs: 01/27/2022: ALT 25; BUN 5; Creatinine, Ser 0.80; Hemoglobin 15.3; Magnesium 2.0; Platelets 384; Potassium 3.6; Sodium 136   Recent Lipid Panel No results found for: CHOL, TRIG, HDL, CHOLHDL, LDLCALC, LDLDIRECT  Physical Exam:    VS:  BP 127/79   Pulse 81   Ht 5\' 1"  (1.549 m)   Wt 185 lb 3.2 oz (84 kg)   LMP 11/29/2021   SpO2 99%   BMI 34.99 kg/m     Wt Readings from Last 3 Encounters:  01/31/22 185 lb 3.2 oz (84 kg)  01/27/22 184 lb 4.8 oz (83.6 kg)  01/10/22 192 lb 12.8 oz (87.5 kg)     GEN:  Well nourished, well developed in no acute distress HEENT: Normal NECK: No JVD; No carotid bruits LYMPHATICS: No lymphadenopathy CARDIAC: RRR, no murmurs, rubs, gallops RESPIRATORY:  Clear to auscultation without rales, wheezing or rhonchi  ABDOMEN: Soft, non-tender, non-distended MUSCULOSKELETAL:  No edema; No deformity  SKIN: Warm and dry NEUROLOGIC:  Alert and oriented x 3 PSYCHIATRIC:  Normal affect    Risk Assessment/Risk Calculators:     CARPREG II Risk Prediction Index Score:  1.  The patient's risk for a primary cardiac event is 5%.            ASSESSMENT & PLAN:    Inappropriate sinus tachycardia-I think her symptoms are related to the fact that she had been taking her beta-blocker.  Also started the patient on low-dose metoprolol succinate 12.5 mg daily.  Advised the patient to limit weight gain to 11 to 12 pounds.    Patient Instructions  Medication  Instructions:  Your physician has recommended you make the following change in your medication:  START: Toprol-XL 12.5 mg at night *If you need a refill on your cardiac medications before your next appointment, please call your pharmacy*   Lab Work: None If you have labs (blood work) drawn today and your tests are completely normal, you will receive your results only by: MyChart Message (if you have MyChart) OR A paper copy in the mail If you have any lab test that is abnormal or we need to change your treatment, we will call you to review the results.   Testing/Procedures: None   Follow-Up: At  [facility], you and your health needs are our priority.  As part of our continuing mission to provide you with exceptional heart care, we have created designated Provider Care Teams.  These Care Teams include your primary Cardiologist (physician) and Advanced Practice Providers (APPs -  Physician Assistants and Nurse Practitioners) who all work together to provide you with the care you need, when you need it.  We recommend signing up for the patient portal called "MyChart".  Sign up information is provided on this After Visit Summary.  MyChart is used to connect with patients for Virtual Visits (Telemedicine).  Patients are able to view lab/test results, encounter notes, upcoming appointments, etc.  Non-urgent messages can be sent to your provider as well.   To learn more about what you can do with MyChart, go to [email or web address].    Your next appointment:   8 week(s)  The format for your next appointment:   In Person  Provider:   Thomasene RippleKardie Marvin Grabill, DO    Other Instructions   Important Information About Sugar         Dispo:  Return in about 8 weeks (around 03/28/2022).   Medication Adjustments/Labs and Tests Ordered: Current medicines are reviewed at length with the patient today.  Concerns regarding medicines are outlined above.  Tests Ordered: Orders Placed This  Encounter  Procedures   Ambulatory referral to Obstetrics / Gynecology   EKG 12-Lead   Medication Changes: Meds ordered this encounter  Medications   metoprolol succinate (TOPROL XL) 25 MG 24 hr tablet    Sig: Take 0.5 tablets (12.5 mg total) by mouth daily.    Dispense:  45 tablet    Refill:  3

## 2022-02-04 ENCOUNTER — Encounter: Payer: Self-pay | Admitting: Women's Health

## 2022-02-04 DIAGNOSIS — R000 Tachycardia, unspecified: Secondary | ICD-10-CM | POA: Insufficient documentation

## 2022-02-04 DIAGNOSIS — I4711 Inappropriate sinus tachycardia, so stated: Secondary | ICD-10-CM | POA: Insufficient documentation

## 2022-02-09 ENCOUNTER — Inpatient Hospital Stay (HOSPITAL_COMMUNITY)
Admission: AD | Admit: 2022-02-09 | Discharge: 2022-02-09 | Disposition: A | Discharge status: Home / Self Care | Payer: Medicaid Other | Attending: Family Medicine | Admitting: Family Medicine

## 2022-02-09 ENCOUNTER — Encounter (HOSPITAL_COMMUNITY): Payer: Self-pay | Admitting: Family Medicine

## 2022-02-09 ENCOUNTER — Other Ambulatory Visit: Payer: Self-pay

## 2022-02-09 DIAGNOSIS — O26851 Spotting complicating pregnancy, first trimester: Secondary | ICD-10-CM | POA: Insufficient documentation

## 2022-02-09 DIAGNOSIS — N939 Abnormal uterine and vaginal bleeding, unspecified: Secondary | ICD-10-CM | POA: Diagnosis not present

## 2022-02-09 DIAGNOSIS — O23591 Infection of other part of genital tract in pregnancy, first trimester: Secondary | ICD-10-CM | POA: Diagnosis not present

## 2022-02-09 DIAGNOSIS — B3731 Acute candidiasis of vulva and vagina: Secondary | ICD-10-CM | POA: Diagnosis not present

## 2022-02-09 DIAGNOSIS — Z3491 Encounter for supervision of normal pregnancy, unspecified, first trimester: Secondary | ICD-10-CM

## 2022-02-09 DIAGNOSIS — Z3A10 10 weeks gestation of pregnancy: Secondary | ICD-10-CM | POA: Insufficient documentation

## 2022-02-09 HISTORY — DX: Inappropriate sinus tachycardia, so stated: I47.11

## 2022-02-09 HISTORY — DX: Tachycardia, unspecified: R00.0

## 2022-02-09 MED ORDER — TERCONAZOLE 0.4 % VA CREA
1.0000 | TOPICAL_CREAM | Freq: Every day | VAGINAL | 0 refills | Status: DC
Start: 2022-02-09 — End: 2022-02-14

## 2022-02-09 MED ORDER — PRENATAL VITAMIN 27-0.8 MG PO TABS: 1.0000 | ORAL_TABLET | Freq: Every day | ORAL | 4 refills | Status: DC

## 2022-02-09 NOTE — MAU Provider Note (Signed)
Chief Complaint: Vaginal Bleeding   Event Date/Time   First Provider Initiated Contact with Patient 02/09/22 1721      SUBJECTIVE HPI: Kimberly Lewis is a 26 y.o. G2P1001 at [redacted]w[redacted]d by early ultrasound who presents to maternity admissions reporting vaginal spotting when wiping today. She reports recent constipation, resolved with Miralax and did have a bowel movement before the bleeding started.  She also had symptoms of a yeast infection with vaginal itching and white discharge and started treatment with some Terazol she had left at home from previous treatments.  She reports mild itching today.    HPI  Past Medical History:  Diagnosis Date   Inappropriate sinus tachycardia    Tachycardia    was taken off medication due to pregnancy-naldolol   Trichomonas infection 2017   Past Surgical History:  Procedure Laterality Date   WISDOM TOOTH EXTRACTION Bilateral    Social History   Socioeconomic History   Marital status: Single    Spouse name: Not on file   Number of children: Not on file   Years of education: Not on file   Highest education level: Not on file  Occupational History   Not on file  Tobacco Use   Smoking status: Former    Types: Cigarettes    Passive exposure: Past   Smokeless tobacco: Not on file  Vaping Use   Vaping Use: Never used  Substance and Sexual Activity   Alcohol use: Not Currently   Drug use: Not Currently   Sexual activity: Yes  Other Topics Concern   Not on file  Social History Narrative   Not on file   Social Determinants of Health   Financial Resource Strain: Not on file  Food Insecurity: Not on file  Transportation Needs: No Transportation Needs   Lack of Transportation (Medical): No   Lack of Transportation (Non-Medical): No  Physical Activity: Not on file  Stress: Not on file  Social Connections: Not on file  Intimate Partner Violence: Not on file   No current facility-administered medications on file prior to encounter.    Current Outpatient Medications on File Prior to Encounter  Medication Sig Dispense Refill   famotidine (PEPCID) 20 MG tablet Take 1 tablet (20 mg total) by mouth daily. 30 tablet 1   promethazine (PHENERGAN) 12.5 MG tablet Take 1 tablet (12.5 mg total) by mouth every 6 (six) hours as needed for nausea or vomiting. 30 tablet 1   albuterol (PROVENTIL,VENTOLIN) 90 MCG/ACT inhaler Inhale 2 puffs into the lungs 2 (two) times daily as needed. For shortness of breath   (Patient not taking: Reported on 01/27/2022)     diphenhydrAMINE (BENADRYL) 25 MG tablet Take 25 mg by mouth 2 (two) times daily as needed. For allergic reaction  (Patient not taking: Reported on 01/27/2022)     metoprolol succinate (TOPROL XL) 25 MG 24 hr tablet Take 0.5 tablets (12.5 mg total) by mouth daily. 45 tablet 3   Prenatal Vit-Fe Fumarate-FA (PRENATAL VITAMIN PO) Take 1 tablet by mouth daily.     Allergies  Allergen Reactions   Aspirin Nausea And Vomiting    abd pain and vomiting abd pain and vomiting     ROS:  Review of Systems  Constitutional:  Negative for chills and fever.  Respiratory:  Negative for cough and shortness of breath.   Cardiovascular:  Negative for chest pain.  Gastrointestinal:  Negative for nausea and vomiting.  Genitourinary:  Positive for vaginal bleeding. Negative for dysuria, frequency and urgency.  Musculoskeletal:  Negative.   Neurological:  Negative for dizziness and headaches.    I have reviewed patient's Past Medical Hx, Surgical Hx, Family Hx, Social Hx, medications and allergies.   Physical Exam  Patient Vitals for the past 24 hrs:  BP Temp Temp src Pulse Resp SpO2 Height Weight  02/09/22 1806 125/65 -- -- 78 -- -- -- --  02/09/22 1434 120/66 98.1 F (36.7 C) Oral 67 16 98 % -- --  02/09/22 1430 -- -- -- -- -- -- 5\' 1"  (1.549 m) 82.6 kg   Constitutional: Well-developed, well-nourished female in no acute distress.  Cardiovascular: normal rate Respiratory: normal effort GI:  Abd soft, non-tender. Pos BS x 4 MS: Extremities nontender, no edema, normal ROM Neurologic: Alert and oriented x 4.  GU: Neg CVAT.  PELVIC EXAM: Deferred   LAB RESULTS No results found for this or any previous visit (from the past 24 hour(s)).  --/--/B POS (04/27 1243)  IMAGING  Bedside US with active fetus at ~ [redacted] weeks ga with normal FHR, subjectively normal amniotic fluid. No abnormalities noted.  US OB Transvaginal  Result Date: 01/26/2022 CLINICAL DATA:  Initial evaluation for early pregnancy, assess viability. EXAM: TRANSVAGINAL OB ULTRASOUND TECHNIQUE: Transvaginal ultrasound was performed for complete evaluation of the gestation as well as the maternal uterus, adnexal regions, and pelvic cul-de-sac. COMPARISON:  Prior ultrasound from 01/10/2022. FINDINGS: Intrauterine gestational sac: Single Yolk sac:  Present Embryo:  Present Cardiac Activity: Present Heart Rate: 160 bpm CRL:   12.6 mm   7 w 3 d                  [place] EDC: 09/09/2022 Subchorionic hemorrhage:  None visualized. Maternal uterus/adnexae: Left ovary within normal limits. 1.6 x 0.8 x 1.1 cm hemorrhagic corpus luteal cyst noted within the right ovary. No other adnexal mass or free fluid. IMPRESSION: 1. Single viable IUP, estimated gestational age [redacted] weeks and 3 days by crown-rump length, with ultrasound EDC of 09/09/2022. No complication. 2. 1.6 cm hemorrhagic right ovarian corpus luteal cyst. Electronically Signed   By: Jeannine Boga M.D.   On: 01/26/2022 04:51    MAU Management/MDM: Orders Placed This Encounter  Procedures   Discharge patient    Meds ordered this encounter  Medications   terconazole (TERAZOL 7) 0.4 % vaginal cream    Sig: Place 1 applicator vaginally at bedtime.    Dispense:  45 g    Refill:  0    Order Specific Question:   Supervising Provider    Answer:   Donnamae Jude [id number]    Discussed US findings with pt, reassurance provided.  Pt to continue treatment for yeast, and will send Rx for  Terazol 7 so pt can complete 7 days of treatment.  Continue Miralax as prescribed.  F/U with NOB visit as scheduled, return to MAU as needed for emergencies.   ASSESSMENT 1. Vaginal spotting   2. Normal IUP (intrauterine pregnancy) on prenatal ultrasound, first trimester   3. Vaginal candidiasis     PLAN Discharge home Allergies as of 02/09/2022       Reactions   Aspirin Nausea And Vomiting   abd pain and vomiting abd pain and vomiting        Medication List     TAKE these medications    albuterol 90 MCG/ACT inhaler Commonly known as: PROVENTIL,VENTOLIN Inhale 2 puffs into the lungs 2 (two) times daily as needed. For shortness of breath   diphenhydrAMINE 25 MG tablet Commonly  known as: BENADRYL Take 25 mg by mouth 2 (two) times daily as needed. For allergic reaction   famotidine 20 MG tablet Commonly known as: Pepcid Take 1 tablet (20 mg total) by mouth daily.   metoprolol succinate 25 MG 24 hr tablet Commonly known as: Toprol XL Take 0.5 tablets (12.5 mg total) by mouth daily.   PRENATAL VITAMIN PO Take 1 tablet by mouth daily.   promethazine 12.5 MG tablet Commonly known as: PHENERGAN Take 1 tablet (12.5 mg total) by mouth every 6 (six) hours as needed for nausea or vomiting.   terconazole 0.4 % vaginal cream Commonly known as: TERAZOL 7 Place 1 applicator vaginally at bedtime.        Follow-up [place] for [facility] [place] Follow up.   Specialty: Obstetrics and Gynecology Why: As scheduled Contact information: [place] [facility] Follow up.   Specialty: Obstetrics and Gynecology Why: As needed for emergencies Contact information: [address] [id number] [place] Certified Nurse-Midwife 02/09/2022  6:06 PM

## 2022-02-09 NOTE — MAU Note (Signed)
Kimberly Lewis is a 26 y.o. at [redacted]w[redacted]d here in MAU reporting: today when she wiped after using the bathroom she saw some pink. Also has been constipated but states the spotting was after she had a bowel movement. Has been using monistat for yeast- states she's feeling better.   Onset of complaint: today  Pain score: 0/10  Vitals:   02/09/22 1434  BP: 120/66  Pulse: 67  Resp: 16  Temp: 98.1 F (36.7 C)  SpO2: 98%     Lab orders placed from triage: none

## 2022-02-14 DIAGNOSIS — E669 Obesity, unspecified: Secondary | ICD-10-CM | POA: Insufficient documentation

## 2022-02-18 ENCOUNTER — Ambulatory Visit (INDEPENDENT_AMBULATORY_CARE_PROVIDER_SITE_OTHER): Payer: Medicaid Other | Admitting: Obstetrics and Gynecology

## 2022-02-18 ENCOUNTER — Encounter: Payer: Self-pay | Admitting: Obstetrics and Gynecology

## 2022-02-18 ENCOUNTER — Other Ambulatory Visit (HOSPITAL_COMMUNITY)
Admission: RE | Admit: 2022-02-18 | Discharge: 2022-02-18 | Disposition: A | Discharge status: Home / Self Care | Payer: Medicaid Other | Source: Ambulatory Visit | Attending: Obstetrics and Gynecology | Admitting: Obstetrics and Gynecology

## 2022-02-18 VITALS — BP 121/78 | HR 72 | Wt 180.0 lb

## 2022-02-18 DIAGNOSIS — N898 Other specified noninflammatory disorders of vagina: Secondary | ICD-10-CM

## 2022-02-18 DIAGNOSIS — Z3A11 11 weeks gestation of pregnancy: Secondary | ICD-10-CM | POA: Diagnosis not present

## 2022-02-18 DIAGNOSIS — Z3491 Encounter for supervision of normal pregnancy, unspecified, first trimester: Secondary | ICD-10-CM | POA: Insufficient documentation

## 2022-02-18 DIAGNOSIS — O2341 Unspecified infection of urinary tract in pregnancy, first trimester: Secondary | ICD-10-CM

## 2022-02-18 DIAGNOSIS — O26891 Other specified pregnancy related conditions, first trimester: Secondary | ICD-10-CM | POA: Insufficient documentation

## 2022-02-18 DIAGNOSIS — Z3480 Encounter for supervision of other normal pregnancy, unspecified trimester: Secondary | ICD-10-CM | POA: Insufficient documentation

## 2022-02-18 DIAGNOSIS — B3731 Acute candidiasis of vulva and vagina: Secondary | ICD-10-CM | POA: Diagnosis not present

## 2022-02-18 DIAGNOSIS — O26899 Other specified pregnancy related conditions, unspecified trimester: Secondary | ICD-10-CM | POA: Insufficient documentation

## 2022-02-18 DIAGNOSIS — Z3401 Encounter for supervision of normal first pregnancy, first trimester: Secondary | ICD-10-CM

## 2022-02-18 NOTE — Progress Notes (Addendum)
History:   Kimberly Lewis is a 26 y.o. G2P1001 at [redacted]w[redacted]d by LMP being seen today for her first obstetrical visit.   Patient does intend to breast feed.   Pregnancy history fully reviewed. Obstetrical history is significant for normal FT SVD  Patient reports vaginal irritation.      HISTORY: OB History  Gravida Para Term Preterm AB Living  2 1 1  0 0 1  SAB IAB Ectopic Multiple Live Births  0 0 0 0 1    # Outcome Date GA Lbr Len/2nd Weight Sex Delivery Anes PTL Lv  2 Current           1 Term 03/26/12   7 lb 1 oz (3.204 kg) M Vag-Spont   LIV     Last pap smear was about one year ago and was LSIL. Recheck pap with HPV today.    Past Medical History:  Diagnosis Date   Inappropriate sinus tachycardia    Tachycardia    was taken off medication due to pregnancy-naldolol   Trichomonas infection 2017   Past Surgical History:  Procedure Laterality Date   WISDOM TOOTH EXTRACTION Bilateral    Family History  Problem Relation Age of Onset   Hypertension Maternal Grandmother    Social History   Tobacco Use   Smoking status: Former    Types: Cigarettes    Passive exposure: Past  Vaping Use   Vaping Use: Never used  Substance Use Topics   Alcohol use: Not Currently   Drug use: Not Currently   Allergies  Allergen Reactions   Aspirin Nausea And Vomiting    abd pain and vomiting abd pain and vomiting    Current Outpatient Medications on File Prior to Visit  Medication Sig Dispense Refill   Prenatal Vit-Fe Fumarate-FA (PRENATAL VITAMIN) 27-0.8 MG TABS Take 1 tablet by mouth daily. 90 tablet 4   famotidine (PEPCID) 20 MG tablet Take 1 tablet (20 mg total) by mouth daily. (Patient not taking: Reported on 02/18/2022) 30 tablet 1   metoprolol succinate (TOPROL XL) 25 MG 24 hr tablet Take 0.5 tablets (12.5 mg total) by mouth daily. (Patient not taking: Reported on 02/18/2022) 45 tablet 3   promethazine (PHENERGAN) 12.5 MG tablet Take 1 tablet (12.5 mg total) by mouth every 6  (six) hours as needed for nausea or vomiting. (Patient not taking: Reported on 02/18/2022) 30 tablet 1   No current facility-administered medications on file prior to visit.    Review of Systems Pertinent items noted in HPI and remainder of comprehensive ROS otherwise negative.  Physical Exam:   Vitals:   02/18/22 0813  BP: 121/78  Pulse: 72  Weight: 180 lb (81.6 kg)   Fetal Heart Rate (bpm): 167 Bedside Ultrasound for FHR check: Viable intrauterine pregnancy with positive cardiac activity noted, fetal heart rate 167 bpm  Patient informed that the ultrasound is considered a limited obstetric ultrasound and is not intended to be a complete ultrasound exam.  Patient also informed that the ultrasound is not being completed with the intent of assessing for fetal or placental anomalies or any pelvic abnormalities.  Explained that the purpose of today's ultrasound is to assess for fetal heart rate.  Patient acknowledges the purpose of the exam and the limitations of the study.  General: well-developed, well-nourished female in no acute distress  Breasts:  normal appearance, no masses or tenderness bilaterally  Skin: normal coloration and turgor, no rashes  Neurologic: oriented, normal, negative, normal mood  Extremities: normal strength,  tone, and muscle mass, ROM of all joints is normal  HEENT PERRLA, extraocular movement intact and sclera clear, anicteric  Neck supple and no masses  Cardiovascular: regular rate and rhythm  Respiratory:  no respiratory distress, normal breath sounds  Abdomen: soft, non-tender; bowel sounds normal; no masses,  no organomegaly  Pelvic: normal external genitalia, no lesions, normal vaginal mucosa, normal vaginal discharge, normal cervix, pap smear done. Uterine size:  aga    Assessment:    Pregnancy: G2P1001 Patient Active Problem List   Diagnosis Date Noted   Obesity (BMI 35.0-39.9 without comorbidity) 02/14/2022   Inappropriate sinus tachycardia  02/04/2022   Papanicolaou smear of cervix with low grade squamous intraepithelial lesion (LGSIL) 07/05/2020   PCOS (polycystic ovarian syndrome) 07/05/2020   MDD (major depressive disorder), recurrent episode (HCC) 01/11/2018   Posttraumatic stress disorder 12/14/2015     Plan:    1. Normal IUP (intrauterine pregnancy) on prenatal ultrasound, first trimester Initial labs drawn. Pap/HPV and Cx done.  Continue prenatal vitamins. Problem list reviewed and updated. Genetic Screening discussed, First trimester screen, Quad screen, and NIPS: ordered NIPS and Horizon. Ultrasound discussed; fetal anatomic survey: will be scheduled. Anticipatory guidance about prenatal visits given including labs, ultrasounds, and testing. Discussed usage of Babyscripts and virtual visits Information given on WB.   - Cytology - PAP( [place]) - Obstetric panel - HIV antibody (with reflex) - Hepatitis C Antibody - [place] bedside; Future - Urine Culture  2. Vaginal discharge during pregnancy, antepartum - Cervicovaginal ancillary only( [place])  Depression score was elevated - clarified with patient. She denies she feels depressed and reports she didn't actually read the questions. She denies SI/HI. Acknowledges she has irritibility while pregnant but otherwise feels well and happy. Has h/o underlying depression, but declines IBH referral at this time.   The nature of [facility] with multiple MDs and Advanced Practice Providers was explained to patient; also emphasized that residents, students are part of our team. Routine obstetric precautions reviewed. Encouraged to seek out care at office or emergency room [facility] MAU preferred) for urgent and/or emergent concerns. Return in about 4 weeks (around 03/18/2022) for OB visit with CNM please.    Milas Hock, MD, FACOG Obstetrician & Gynecologist, [facility] for [facility], [facility]

## 2022-02-18 NOTE — Progress Notes (Signed)
Bedside U/S shows single IUP with FHT of 167 BPM .  CRL measuring 46.30mm  Consistent with LMP and EDC [redacted]w[redacted]d

## 2022-02-18 NOTE — Patient Instructions (Signed)

## 2022-02-18 NOTE — Progress Notes (Signed)
PHQ 9: Score 25 GAD 7 Score: 20

## 2022-02-19 LAB — CERVICOVAGINAL ANCILLARY ONLY
Bacterial Vaginitis (gardnerella): NEGATIVE
Candida Glabrata: NEGATIVE
Candida Vaginitis: POSITIVE — AB
Comment: NEGATIVE
Comment: NEGATIVE
Comment: NEGATIVE

## 2022-02-19 LAB — CYTOLOGY - PAP
Chlamydia: NEGATIVE
Comment: NEGATIVE
Comment: NORMAL
Diagnosis: NEGATIVE
Neisseria Gonorrhea: NEGATIVE

## 2022-02-20 LAB — OBSTETRIC PANEL
Absolute Monocytes: 476 cells/uL (ref 200–950)
Antibody Screen: NOT DETECTED
Basophils Absolute: 20 cells/uL (ref 0–200)
Basophils Relative: 0.3 %
Eosinophils Absolute: 82 cells/uL (ref 15–500)
Eosinophils Relative: 1.2 %
HCT: 40.2 % (ref 35.0–45.0)
Hemoglobin: 13.5 g/dL (ref 11.7–15.5)
Hepatitis B Surface Ag: NONREACTIVE
Lymphs Abs: 1523 cells/uL (ref 850–3900)
MCH: 29.7 pg (ref 27.0–33.0)
MCHC: 33.6 g/dL (ref 32.0–36.0)
MCV: 88.5 fL (ref 80.0–100.0)
MPV: 9.7 fL (ref 7.5–12.5)
Monocytes Relative: 7 %
Neutro Abs: 4699 cells/uL (ref 1500–7800)
Neutrophils Relative %: 69.1 %
Platelets: 356 10*3/uL (ref 140–400)
RBC: 4.54 10*6/uL (ref 3.80–5.10)
RDW: 13 % (ref 11.0–15.0)
RPR Ser Ql: NONREACTIVE
Rubella: 8.14 Index
Total Lymphocyte: 22.4 %
WBC: 6.8 10*3/uL (ref 3.8–10.8)

## 2022-02-20 LAB — URINE CULTURE
MICRO NUMBER:: 13484669
SPECIMEN QUALITY:: ADEQUATE

## 2022-02-20 LAB — HIV ANTIBODY (ROUTINE TESTING W REFLEX): HIV 1&2 Ab, 4th Generation: NONREACTIVE

## 2022-02-20 LAB — HEPATITIS C ANTIBODY
Hepatitis C Ab: NONREACTIVE
SIGNAL TO CUT-OFF: 0.13 (ref <1.00)

## 2022-02-25 MED ORDER — CEFADROXIL 500 MG PO CAPS: 500.0000 mg | ORAL_CAPSULE | Freq: Two times a day (BID) | ORAL | 0 refills | Status: DC

## 2022-02-25 NOTE — Addendum Note (Signed)
Addended by: Milas Hock A on: 02/25/2022 10:06 AM   Modules accepted: Orders

## 2022-03-18 ENCOUNTER — Encounter (HOSPITAL_COMMUNITY): Payer: Self-pay | Admitting: Obstetrics and Gynecology

## 2022-03-18 ENCOUNTER — Inpatient Hospital Stay (HOSPITAL_COMMUNITY)
Admission: AD | Admit: 2022-03-18 | Discharge: 2022-03-19 | Disposition: A | Discharge status: Home / Self Care | Payer: Medicaid Other | Attending: Obstetrics and Gynecology | Admitting: Obstetrics and Gynecology

## 2022-03-18 DIAGNOSIS — Z3A15 15 weeks gestation of pregnancy: Secondary | ICD-10-CM | POA: Insufficient documentation

## 2022-03-18 DIAGNOSIS — O26899 Other specified pregnancy related conditions, unspecified trimester: Secondary | ICD-10-CM | POA: Diagnosis not present

## 2022-03-18 DIAGNOSIS — O98819 Other maternal infectious and parasitic diseases complicating pregnancy, unspecified trimester: Secondary | ICD-10-CM | POA: Insufficient documentation

## 2022-03-18 DIAGNOSIS — N760 Acute vaginitis: Secondary | ICD-10-CM

## 2022-03-18 DIAGNOSIS — B3731 Acute candidiasis of vulva and vagina: Secondary | ICD-10-CM

## 2022-03-18 DIAGNOSIS — Z792 Long term (current) use of antibiotics: Secondary | ICD-10-CM | POA: Insufficient documentation

## 2022-03-18 DIAGNOSIS — R1030 Lower abdominal pain, unspecified: Secondary | ICD-10-CM | POA: Diagnosis not present

## 2022-03-18 DIAGNOSIS — B9689 Other specified bacterial agents as the cause of diseases classified elsewhere: Secondary | ICD-10-CM | POA: Insufficient documentation

## 2022-03-18 NOTE — MAU Note (Signed)
Kimberly Lewis is a 26 y.o. at [redacted]w[redacted]d here in MAU reporting: Lower abdominal pain that started this morning. At first felt like possible bladder infection but now feeling like cramping more on the left. No bleeding. No LOF LMP: 11/29/21 Onset of complaint: today Pain score: 8/10 Vitals:   03/18/22 2332  BP: 121/61  Pulse: 81  Resp: 18  Temp: 98.7 F (37.1 C)  SpO2: 100%     FHT:166 Lab orders placed from triage: UA

## 2022-03-19 ENCOUNTER — Inpatient Hospital Stay (HOSPITAL_BASED_OUTPATIENT_CLINIC_OR_DEPARTMENT_OTHER): Payer: Medicaid Other

## 2022-03-19 DIAGNOSIS — O26899 Other specified pregnancy related conditions, unspecified trimester: Secondary | ICD-10-CM

## 2022-03-19 DIAGNOSIS — R102 Pelvic and perineal pain: Secondary | ICD-10-CM | POA: Diagnosis not present

## 2022-03-19 DIAGNOSIS — R1030 Lower abdominal pain, unspecified: Secondary | ICD-10-CM

## 2022-03-19 DIAGNOSIS — Z3A15 15 weeks gestation of pregnancy: Secondary | ICD-10-CM

## 2022-03-19 DIAGNOSIS — O26892 Other specified pregnancy related conditions, second trimester: Secondary | ICD-10-CM

## 2022-03-19 LAB — URINALYSIS, ROUTINE W REFLEX MICROSCOPIC
Bilirubin Urine: NEGATIVE
Glucose, UA: NEGATIVE mg/dL
Ketones, ur: NEGATIVE mg/dL
Nitrite: NEGATIVE
Protein, ur: 100 mg/dL — AB
RBC / HPF: >50 RBC/hpf — ABNORMAL HIGH (ref 0–5)
Specific Gravity, Urine: 1.012 (ref 1.005–1.030)
WBC, UA: >50 WBC/hpf — ABNORMAL HIGH (ref 0–5)
pH: 6 (ref 5.0–8.0)

## 2022-03-19 LAB — WET PREP, GENITAL
Sperm: NONE SEEN
Trich, Wet Prep: NONE SEEN
WBC, Wet Prep HPF POC: <10 (ref <10)

## 2022-03-19 MED ORDER — METRONIDAZOLE 500 MG PO TABS: 500.0000 mg | ORAL_TABLET | Freq: Two times a day (BID) | ORAL | 0 refills | Status: DC

## 2022-03-19 MED ORDER — CYCLOBENZAPRINE HCL 5 MG PO TABS
10.0000 mg | ORAL_TABLET | Freq: Once | ORAL | Status: AC
  Administered 2022-03-19: 10 mg via ORAL
  Filled 2022-03-19: qty 2

## 2022-03-19 MED ORDER — TERCONAZOLE 0.4 % VA CREA: 1.0000 | TOPICAL_CREAM | Freq: Every day | VAGINAL | 0 refills | Status: DC

## 2022-03-19 NOTE — MAU Provider Note (Signed)
History     CSN: [id number]  Arrival date and time: 03/18/22 2321   Event Date/Time   First Provider Initiated Contact with Patient 03/19/22 0045      Chief Complaint  Patient presents with   Abdominal Pain   Kimberly Lewis is a 26 y.o. G2P1001 at [redacted]w[redacted]d who receives care at [facility].  She presents today for Abdominal Pain.  She states the pain started today at around 4pm and has gotten progressively worse.  She describes the pain as "cramping" and feels it is constant.  She states the cramping is located on her lower left side.  She denies aggravating or relieving factors for the pain. She rates the pain a 8/10. No recent sexual activity and no pain or discomfort with urination.     OB History     Gravida  2   Para  1   Term  1   Preterm  0   AB  0   Living  1      SAB  0   IAB  0   Ectopic  0   Multiple  0   Live Births  1           Past Medical History:  Diagnosis Date   Inappropriate sinus tachycardia    Tachycardia    was taken off medication due to pregnancy-naldolol   Trichomonas infection 2017    Past Surgical History:  Procedure Laterality Date   WISDOM TOOTH EXTRACTION Bilateral     Family History  Problem Relation Age of Onset   Hypertension Maternal Grandmother     Social History   Tobacco Use   Smoking status: Former    Types: Cigarettes    Passive exposure: Past  Vaping Use   Vaping Use: Never used  Substance Use Topics   Alcohol use: Not Currently   Drug use: Not Currently    Allergies:  Allergies  Allergen Reactions   Aspirin Nausea And Vomiting    abd pain and vomiting abd pain and vomiting     Medications Prior to Admission  Medication Sig Dispense Refill Last Dose   cefadroxil (DURICEF) 500 MG capsule Take 1 capsule (500 mg total) by mouth 2 (two) times daily. 14 capsule 0 Past Month   Prenatal Vit-Fe Fumarate-FA (PRENATAL VITAMIN) 27-0.8 MG TABS Take 1 tablet by mouth daily. 90 tablet 4 03/18/2022    famotidine (PEPCID) 20 MG tablet Take 1 tablet (20 mg total) by mouth daily. (Patient not taking: Reported on 02/18/2022) 30 tablet 1    metoprolol succinate (TOPROL XL) 25 MG 24 hr tablet Take 0.5 tablets (12.5 mg total) by mouth daily. (Patient not taking: Reported on 02/18/2022) 45 tablet 3    promethazine (PHENERGAN) 12.5 MG tablet Take 1 tablet (12.5 mg total) by mouth every 6 (six) hours as needed for nausea or vomiting. (Patient not taking: Reported on 02/18/2022) 30 tablet 1     Review of Systems  Gastrointestinal:  Positive for abdominal pain (Cramping). Negative for constipation, diarrhea, nausea and vomiting.  Genitourinary:  Negative for difficulty urinating, dysuria, vaginal bleeding and vaginal discharge.  Neurological:  Negative for dizziness, light-headedness and headaches.   Physical Exam   Blood pressure 125/60, pulse 86, temperature 98.7 F (37.1 C), temperature source Oral, resp. rate 17, height 5\' 1"  (1.549 m), weight 84.9 kg, last menstrual period 11/29/2021, SpO2 100 %.  Physical Exam Vitals reviewed. Exam conducted with a chaperone present.  Constitutional:      General: She  is not in acute distress.    Appearance: She is well-developed.  HENT:     Head: Normocephalic and atraumatic.  Eyes:     Conjunctiva/sclera: Conjunctivae normal.  Cardiovascular:     Rate and Rhythm: Normal rate.  Pulmonary:     Effort: Pulmonary effort is normal. No respiratory distress.  Abdominal:     General: Abdomen is flat.     Palpations: Abdomen is soft.     Tenderness: There is no abdominal tenderness.     Comments: Reports pressure during palpation, but no apparent or reported tenderness.  Genitourinary:    Comments: Swabs collected blindly Cervix closed No tenderness in cul-de-sac Musculoskeletal:     Cervical back: Normal range of motion.  Skin:    General: Skin is warm and dry.  Neurological:     Mental Status: She is alert and oriented to person, place, and time.   Psychiatric:        Mood and Affect: Mood normal.        Behavior: Behavior normal.        Thought Content: Thought content normal.     MAU Course  Procedures Results for orders placed or performed during the hospital encounter of 03/18/22 (from the past 24 hour(s))  Urinalysis, Routine w reflex microscopic Urine, Clean Catch     Status: Abnormal   Collection Time: 03/18/22 11:57 PM  Result Value Ref Range   Color, Urine YELLOW YELLOW   APPearance CLOUDY (A) CLEAR   Specific Gravity, Urine 1.012 1.005 - 1.030   pH 6.0 5.0 - 8.0   Glucose, UA NEGATIVE NEGATIVE mg/dL   Hgb urine dipstick MODERATE (A) NEGATIVE   Bilirubin Urine NEGATIVE NEGATIVE   Ketones, ur NEGATIVE NEGATIVE mg/dL   Protein, ur 737 (A) NEGATIVE mg/dL   Nitrite NEGATIVE NEGATIVE   Leukocytes,Ua LARGE (A) NEGATIVE   RBC / HPF >50 (H) 0 - 5 RBC/hpf   WBC, UA >50 (H) 0 - 5 WBC/hpf   Bacteria, UA FEW (A) NONE SEEN   Squamous Epithelial / LPF 0-5 0 - 5   Mucus PRESENT   Wet prep, genital     Status: Abnormal   Collection Time: 03/19/22 12:55 AM   Specimen: PATH Cytology Cervicovaginal Ancillary Only  Result Value Ref Range   Yeast Wet Prep HPF POC PRESENT (A) NONE SEEN   Trich, Wet Prep NONE SEEN NONE SEEN   Clue Cells Wet Prep HPF POC PRESENT (A) NONE SEEN   WBC, Wet Prep HPF POC <10 <10   Sperm NONE SEEN       MDM Labs: UA Pain Medication Prescriptions Assessment and Plan  26 year old, G2P1001  SIUP at 15.5 weeks Left Lower Quadrant Pain  -Reviewed POC with patient. -Exam performed and findings discussed.  -Cultures collected and pending.  -Patient offered and accepts medication. Declines tylenol. -Flexeril ordered. -Discussed obtaining US to view ovaries considering h/o hemorrhagic cysts.  Patient agreeable. -Await results.    Cherre Robins 03/19/2022, 12:45 AM   Reassessment (1:56 AM)  -Wet prep returns if again for BV and yeast. -Preliminary ultrasound shows normal right and left  ovaries. -Provider to bedside to review results and treatment. -Patient agreeable to yeast vaginal cream and metronidazole tablets.  Instructed to take both concurrently.  -Verbalizes understanding has no questions or concerns. -Encouraged to call primary office or return to MAU if symptoms worsen or with the onset of new symptoms. -Discharged to home in stable condition.  Cherre Robins  MSN, CNM Systems developer, Center for [employer]

## 2022-03-20 ENCOUNTER — Encounter: Payer: Self-pay | Admitting: *Deleted

## 2022-03-20 DIAGNOSIS — Z3401 Encounter for supervision of normal first pregnancy, first trimester: Secondary | ICD-10-CM

## 2022-03-20 LAB — CULTURE, OB URINE

## 2022-03-20 LAB — GC/CHLAMYDIA PROBE AMP (~~LOC~~) NOT AT ARMC
Chlamydia: NEGATIVE
Comment: NEGATIVE
Comment: NORMAL
Neisseria Gonorrhea: NEGATIVE

## 2022-03-22 ENCOUNTER — Telehealth: Payer: Self-pay | Admitting: *Deleted

## 2022-03-22 ENCOUNTER — Ambulatory Visit (INDEPENDENT_AMBULATORY_CARE_PROVIDER_SITE_OTHER): Payer: Medicaid Other

## 2022-03-22 VITALS — BP 109/68 | HR 92 | Wt 188.0 lb

## 2022-03-22 DIAGNOSIS — Z3402 Encounter for supervision of normal first pregnancy, second trimester: Secondary | ICD-10-CM | POA: Diagnosis not present

## 2022-03-22 DIAGNOSIS — Z3A16 16 weeks gestation of pregnancy: Secondary | ICD-10-CM

## 2022-03-22 DIAGNOSIS — Z3401 Encounter for supervision of normal first pregnancy, first trimester: Secondary | ICD-10-CM

## 2022-03-22 LAB — GLUCOSE, POCT (MANUAL RESULT ENTRY): POC Glucose: 131 mg/dl — AB (ref 70–99)

## 2022-03-22 NOTE — Progress Notes (Signed)
   PRENATAL VISIT NOTE  Subjective:  Kimberly Lewis is a 26 y.o. G2P1001 at [redacted]w[redacted]d being seen today for ongoing prenatal care.  She is currently monitored for the following issues for this low-risk pregnancy and has Inappropriate sinus tachycardia; Papanicolaou smear of cervix with low grade squamous intraepithelial lesion (LGSIL); Obesity (BMI 35.0-39.9 without comorbidity); MDD (major depressive disorder), recurrent episode (HCC); PCOS (polycystic ovarian syndrome); Posttraumatic stress disorder; and Encounter for supervision of normal first pregnancy in first trimester on their problem list.  Patient reports headaches, fatigue, feeling shaky. She reports a history of migraines, but head aches do not feel like previous migraines. She does not take anything for headaches as they resolve without intervention. Reports she eats 5-6 meals per day with snacks and drinks 5 bottles of water per day. Reports she often feels shaky especially when she does not eat. Contractions: Not present. Vag. Bleeding: None.  Movement: Absent. Denies leaking of fluid.   The following portions of the patient's history were reviewed and updated as appropriate: allergies, current medications, past family history, past medical history, past social history, past surgical history and problem list.   Objective:   Vitals:   03/22/22 0953  BP: 109/68  Pulse: 92  Weight: 188 lb (85.3 kg)    Fetal Status: Fetal Heart Rate (bpm): 156   Movement: Absent     General:  Alert, oriented and cooperative. Patient is in no acute distress.  Skin: Skin is warm and dry. No rash noted.   Cardiovascular: Normal heart rate noted  Respiratory: Normal respiratory effort, no problems with respiration noted  Abdomen: Soft, gravid, appropriate for gestational age.  Pain/Pressure: Absent     Pelvic: Cervical exam deferred        Extremities: Normal range of motion.  Edema: None  Mental Status: Normal mood and affect. Normal behavior. Normal  judgment and thought content.   Assessment and Plan:  Pregnancy: G2P1001 at [redacted]w[redacted]d 1. Encounter for supervision of normal first pregnancy in first trimester - Routine OB. - Patient did not eat breakfast today. CBG 131. Reports she drank fruit punch on way to appt and feels better. Patient to go eat breakfast after appointment. Shakiness likely related to hypoglycemia.  - Increase water intake to help with headaches. Tylenol prn.  - AFP today - Anatomy ultrasound scheduled on 7/27  - Alpha fetoprotein, maternal - POCT Glucose (CBG)  2. [redacted] weeks gestation of pregnancy   Preterm labor symptoms and general obstetric precautions including but not limited to vaginal bleeding, contractions, leaking of fluid and fetal movement were reviewed in detail with the patient. Please refer to After Visit Summary for other counseling recommendations.   Return in about 4 weeks (around 04/19/2022).  Future Appointments  Date Time Provider Department Center  04/10/2022  8:00 AM Thomasene Ripple, DO [facility]  04/11/2022 10:15 AM [facility] NURSE [facility]  04/11/2022 10:30 AM [facility] US3 [facility]  04/18/2022  4:10 PM Milas Hock, MD [facility]    Brand Males, CNM

## 2022-03-22 NOTE — Telephone Encounter (Signed)
Left patient a message that next appointment is with Dr. Para March due to not having any midwife in the office the week of 04/15/22.

## 2022-03-25 LAB — ALPHA FETOPROTEIN, MATERNAL
AFP MoM: 1.51
AFP, Serum: 50.8 ng/mL
Calc'd Gestational Age: 16.1 weeks
Maternal Wt: 188 [lb_av]
Risk for ONTD: <1
Twins-AFP: 1

## 2022-04-10 ENCOUNTER — Encounter: Payer: Self-pay | Admitting: Cardiology

## 2022-04-10 ENCOUNTER — Ambulatory Visit (INDEPENDENT_AMBULATORY_CARE_PROVIDER_SITE_OTHER): Payer: Medicaid Other | Admitting: Cardiology

## 2022-04-10 VITALS — BP 140/74 | HR 73 | Ht 61.0 in | Wt 197.0 lb

## 2022-04-10 DIAGNOSIS — R000 Tachycardia, unspecified: Secondary | ICD-10-CM | POA: Diagnosis not present

## 2022-04-10 NOTE — Progress Notes (Signed)
Cardiology Office Note:    Date:  04/10/2022   ID:  Kimberly Lewis, DOB 07/13/1996, MRN [id number]  PCP:  Pcp, No  Cardiologist:  Thomasene Ripple, DO  Electrophysiologist:  None   Referring MD: No ref. provider found   " I am doing well"   History of Present Illness:    Kimberly Lewis is a 25 y.o. female with a hx of inappropriate sinus tachycardia here today for follow-up. I saw the patient on May 18,2023 at that time she was experiencing palpitations.  When she first got pregnant her nadolol was stopped.  But she started to experience significant palpitations.  She presented at that time.  Given the symptoms I started patient on low-dose metoprolol succinate.  Interim she tells me that her family nurse practitioner told her not to take this medication due to concern for low birthweight.  She tells me she rather not take the medicine today.  She said the symptoms are not persistent as prior.  She is here today with her significant other.   Past Medical History:  Diagnosis Date   Inappropriate sinus tachycardia    Tachycardia    was taken off medication due to pregnancy-naldolol   Trichomonas infection 2017    Past Surgical History:  Procedure Laterality Date   WISDOM TOOTH EXTRACTION Bilateral     Current Medications: Current Meds  Medication Sig   Prenatal Vit-Fe Fumarate-FA (PRENATAL VITAMIN) 27-0.8 MG TABS Take 1 tablet by mouth daily.   [DISCONTINUED] cefadroxil (DURICEF) 500 MG capsule Take 1 capsule (500 mg total) by mouth 2 (two) times daily.   [DISCONTINUED] famotidine (PEPCID) 20 MG tablet Take 1 tablet (20 mg total) by mouth daily.   [DISCONTINUED] metoprolol succinate (TOPROL XL) 25 MG 24 hr tablet Take 0.5 tablets (12.5 mg total) by mouth daily.   [DISCONTINUED] metroNIDAZOLE (FLAGYL) 500 MG tablet Take 1 tablet (500 mg total) by mouth 2 (two) times daily.   [DISCONTINUED] promethazine (PHENERGAN) 12.5 MG tablet Take 1 tablet (12.5 mg total) by mouth every 6  (six) hours as needed for nausea or vomiting.   [DISCONTINUED] terconazole (TERAZOL 7) 0.4 % vaginal cream Place 1 applicator vaginally at bedtime. Use for seven days     Allergies:   Aspirin   Social History   Socioeconomic History   Marital status: Single    Spouse name: Not on file   Number of children: Not on file   Years of education: Not on file   Highest education level: Not on file  Occupational History   Not on file  Tobacco Use   Smoking status: Former    Types: Cigarettes    Passive exposure: Past   Smokeless tobacco: Not on file  Vaping Use   Vaping Use: Never used  Substance and Sexual Activity   Alcohol use: Not Currently   Drug use: Not Currently   Sexual activity: Yes  Other Topics Concern   Not on file  Social History Narrative   Not on file   Social Determinants of Health   Financial Resource Strain: Not on file  Food Insecurity: Not on file  Transportation Needs: No Transportation Needs (02/01/2022)   PRAPARE - Administrator, Civil Service (Medical): No    Lack of Transportation (Non-Medical): No  Physical Activity: Not on file  Stress: Not on file  Social Connections: Not on file     Family History: The patient's family history includes Hypertension in her maternal grandmother.  ROS:   Review  of Systems  Constitution: Negative for decreased appetite, fever and weight gain.  HENT: Negative for congestion, ear discharge, hoarse voice and sore throat.   Eyes: Negative for discharge, redness, vision loss in right eye and visual halos.  Cardiovascular: Negative for chest pain, dyspnea on exertion, leg swelling, orthopnea and palpitations.  Respiratory: Negative for cough, hemoptysis, shortness of breath and snoring.   Endocrine: Negative for heat intolerance and polyphagia.  Hematologic/Lymphatic: Negative for bleeding problem. Does not bruise/bleed easily.  Skin: Negative for flushing, nail changes, rash and suspicious lesions.   Musculoskeletal: Negative for arthritis, joint pain, muscle cramps, myalgias, neck pain and stiffness.  Gastrointestinal: Negative for abdominal pain, bowel incontinence, diarrhea and excessive appetite.  Genitourinary: Negative for decreased libido, genital sores and incomplete emptying.  Neurological: Negative for brief paralysis, focal weakness, headaches and loss of balance.  Psychiatric/Behavioral: Negative for altered mental status, depression and suicidal ideas.  Allergic/Immunologic: Negative for HIV exposure and persistent infections.    EKGs/Labs/Other Studies Reviewed:    The following studies were reviewed today:   EKG:  None today   Recent Labs: 01/27/2022: ALT 25; BUN 5; Creatinine, Ser 0.80; Magnesium 2.0; Potassium 3.6; Sodium 136 02/18/2022: Hemoglobin 13.5; Platelets 356  Recent Lipid Panel No results found for: "CHOL", "TRIG", "HDL", "CHOLHDL", "VLDL", "LDLCALC", "LDLDIRECT"  Physical Exam:    VS:  BP 140/74   Pulse 73   Ht 5\' 1"  (1.549 m)   Wt 197 lb (89.4 kg)   LMP 11/29/2021   SpO2 99%   BMI 37.22 kg/m     Wt Readings from Last 3 Encounters:  04/10/22 197 lb (89.4 kg)  03/22/22 188 lb (85.3 kg)  03/18/22 187 lb 1.6 oz (84.9 kg)     GEN: Well nourished, well developed in no acute distress HEENT: Normal NECK: No JVD; No carotid bruits LYMPHATICS: No lymphadenopathy CARDIAC: S1S2 noted,RRR, no murmurs, rubs, gallops RESPIRATORY:  Clear to auscultation without rales, wheezing or rhonchi  ABDOMEN: Soft, non-tender, non-distended, +bowel sounds, no guarding. EXTREMITIES: No edema, No cyanosis, no clubbing MUSCULOSKELETAL:  No deformity  SKIN: Warm and dry NEUROLOGIC:  Alert and oriented x 3, non-focal PSYCHIATRIC:  Normal affect, good insight  ASSESSMENT:    1. Inappropriate sinus tachycardia    PLAN:    She has had she is not having significant symptoms.  She prefers not to take the beta-blocker at this time.  We talked about self efficacy and  discussing with the patient to try to understand from prescribing provider use of medications with risk and benefits.  She is planned for vaginal delivery and is unsure if she wants to have her baby at [facility] or in [place] [facility].  The patient is in agreement with the above plan. The patient left the office in stable condition.  The patient will follow up in 16 weeks.   Medication Adjustments/Labs and Tests Ordered: Current medicines are reviewed at length with the patient today.  Concerns regarding medicines are outlined above.  No orders of the defined types were placed in this encounter.  No orders of the defined types were placed in this encounter.   Patient Instructions  Medication Instructions:  Your physician recommends that you continue on your current medications as directed. Please refer to the Current Medication list given to you today.  *If you need a refill on your cardiac medications before your next appointment, please call your pharmacy*   Lab Work: NONE If you have labs (blood work) drawn today  and your tests are completely normal, you will receive your results only by: MyChart Message (if you have MyChart) OR A paper copy in the mail If you have any lab test that is abnormal or we need to change your treatment, we will call you to review the results.   Testing/Procedures: NONE   Follow-Up: At [facility], you and your health needs are our priority.  As part of our continuing mission to provide you with exceptional heart care, we have created designated Provider Care Teams.  These Care Teams include your primary Cardiologist (physician) and Advanced Practice Providers (APPs -  Physician Assistants and Nurse Practitioners) who all work together to provide you with the care you need, when you need it.  We recommend signing up for the patient portal called "MyChart".  Sign up information is provided on this After Visit Summary.   MyChart is used to connect with patients for Virtual Visits (Telemedicine).  Patients are able to view lab/test results, encounter notes, upcoming appointments, etc.  Non-urgent messages can be sent to your provider as well.   To learn more about what you can do with MyChart, go to [email or web address].    Your next appointment:   16 week(s)  The format for your next appointment:   In Person  Provider:   Thomasene Ripple, DO     Adopting a Healthy Lifestyle.  Know what a healthy weight is for you (roughly BMI <25) and aim to maintain this   Aim for 7+ servings of fruits and vegetables daily   65-80+ fluid ounces of water or unsweet tea for healthy kidneys   Limit to max 1 drink of alcohol per day; avoid smoking/tobacco   Limit animal fats in diet for cholesterol and heart health - choose grass fed whenever available   Avoid highly processed foods, and foods high in saturated/trans fats   Aim for low stress - take time to unwind and care for your mental health   Aim for 150 min of moderate intensity exercise weekly for heart health, and weights twice weekly for bone health   Aim for 7-9 hours of sleep daily   When it comes to diets, agreement about the perfect plan isnt easy to find, even among the experts. Experts at the [facility] [employer] developed an idea known as the Healthy Eating Plate. Just imagine a plate divided into logical, healthy portions.   The emphasis is on diet quality:   Load up on vegetables and fruits - one-half of your plate: Aim for color and variety, and remember that potatoes dont count.   Go for whole grains - one-quarter of your plate: Whole wheat, barley, wheat berries, quinoa, oats, brown rice, and foods made with them. If you want pasta, go with whole wheat pasta.   Protein power - one-quarter of your plate: Fish, chicken, beans, and nuts are all healthy, versatile protein sources. Limit red meat.   The diet, however, does go beyond  the plate, offering a few other suggestions.   Use healthy plant oils, such as olive, canola, soy, corn, sunflower and peanut. Check the labels, and avoid partially hydrogenated oil, which have unhealthy trans fats.   If youre thirsty, drink water. Coffee and tea are good in moderation, but skip sugary drinks and limit milk and dairy products to one or two daily servings.   The type of carbohydrate in the diet is more important than the amount. Some sources of carbohydrates, such as vegetables, fruits, whole  grains, and beans-are healthier than others.   Finally, stay active  Signed, Thomasene Ripple, DO  04/10/2022 8:14 AM     [facility]

## 2022-04-10 NOTE — Patient Instructions (Signed)
Medication Instructions:  Your physician recommends that you continue on your current medications as directed. Please refer to the Current Medication list given to you today.  *If you need a refill on your cardiac medications before your next appointment, please call your pharmacy*   Lab Work: NONE If you have labs (blood work) drawn today and your tests are completely normal, you will receive your results only by: MyChart Message (if you have MyChart) OR A paper copy in the mail If you have any lab test that is abnormal or we need to change your treatment, we will call you to review the results.   Testing/Procedures: NONE   Follow-Up: At [facility], you and your health needs are our priority.  As part of our continuing mission to provide you with exceptional heart care, we have created designated Provider Care Teams.  These Care Teams include your primary Cardiologist (physician) and Advanced Practice Providers (APPs -  Physician Assistants and Nurse Practitioners) who all work together to provide you with the care you need, when you need it.  We recommend signing up for the patient portal called "MyChart".  Sign up information is provided on this After Visit Summary.  MyChart is used to connect with patients for Virtual Visits (Telemedicine).  Patients are able to view lab/test results, encounter notes, upcoming appointments, etc.  Non-urgent messages can be sent to your provider as well.   To learn more about what you can do with MyChart, go to [email or web address].    Your next appointment:   16 week(s)  The format for your next appointment:   In Person  Provider:   Thomasene Ripple, DO

## 2022-04-11 ENCOUNTER — Other Ambulatory Visit: Payer: Self-pay | Admitting: *Deleted

## 2022-04-11 ENCOUNTER — Ambulatory Visit: Payer: Medicaid Other | Attending: Obstetrics and Gynecology

## 2022-04-11 ENCOUNTER — Ambulatory Visit: Payer: Medicaid Other | Admitting: *Deleted

## 2022-04-11 ENCOUNTER — Encounter: Payer: Self-pay | Admitting: *Deleted

## 2022-04-11 VITALS — BP 126/64 | HR 99

## 2022-04-11 DIAGNOSIS — O99212 Obesity complicating pregnancy, second trimester: Secondary | ICD-10-CM

## 2022-04-11 DIAGNOSIS — Z3A18 18 weeks gestation of pregnancy: Secondary | ICD-10-CM | POA: Insufficient documentation

## 2022-04-11 DIAGNOSIS — Z3491 Encounter for supervision of normal pregnancy, unspecified, first trimester: Secondary | ICD-10-CM | POA: Insufficient documentation

## 2022-04-11 DIAGNOSIS — Z362 Encounter for other antenatal screening follow-up: Secondary | ICD-10-CM

## 2022-04-11 DIAGNOSIS — O4442 Low lying placenta NOS or without hemorrhage, second trimester: Secondary | ICD-10-CM | POA: Insufficient documentation

## 2022-04-11 DIAGNOSIS — Z3689 Encounter for other specified antenatal screening: Secondary | ICD-10-CM

## 2022-04-11 DIAGNOSIS — O321XX0 Maternal care for breech presentation, not applicable or unspecified: Secondary | ICD-10-CM | POA: Diagnosis not present

## 2022-04-12 DIAGNOSIS — O4440 Low lying placenta NOS or without hemorrhage, unspecified trimester: Secondary | ICD-10-CM | POA: Insufficient documentation

## 2022-04-12 DIAGNOSIS — O2340 Unspecified infection of urinary tract in pregnancy, unspecified trimester: Secondary | ICD-10-CM | POA: Insufficient documentation

## 2022-04-12 NOTE — Progress Notes (Unsigned)
   PRENATAL VISIT NOTE  Subjective:  Kimberly Lewis is a 26 y.o. G2P1001 at [redacted]w[redacted]d being seen today for ongoing prenatal care.  She is currently monitored for the following issues for this low-risk pregnancy and has Inappropriate sinus tachycardia; Papanicolaou smear of cervix with low grade squamous intraepithelial lesion (LGSIL); Obesity (BMI 35.0-39.9 without comorbidity); MDD (major depressive disorder), recurrent episode (HCC); PCOS (polycystic ovarian syndrome); Posttraumatic stress disorder; Encounter for supervision of normal first pregnancy in first trimester; UTI in pregnancy; and Low-lying placenta on their problem list.  Patient reports no complaints.  Contractions: Not present. Vag. Bleeding: None.  Movement: Present. Denies leaking of fluid.   The following portions of the patient's history were reviewed and updated as appropriate: allergies, current medications, past family history, past medical history, past social history, past surgical history and problem list.   Objective:   Vitals:   04/18/22 1551  BP: 124/72  Pulse: 80  Weight: 200 lb (90.7 kg)    Fetal Status: Fetal Heart Rate (bpm): 153 Fundal Height: 20 cm Movement: Present     General:  Alert, oriented and cooperative. Patient is in no acute distress.  Skin: Skin is warm and dry. No rash noted.   Cardiovascular: Normal heart rate noted  Respiratory: Normal respiratory effort, no problems with respiration noted  Abdomen: Soft, gravid, appropriate for gestational age.  Pain/Pressure: Absent     Pelvic: Cervical exam deferred        Extremities: Normal range of motion.  Edema: None  Mental Status: Normal mood and affect. Normal behavior. Normal judgment and thought content.   Assessment and Plan:  Pregnancy: G2P1001 at [redacted]w[redacted]d 1. Encounter for supervision of normal first pregnancy in first trimester [facility] wnl Interested in waterbirth - she will do the class and we will have her meet with CNM.   2. Urinary  tract infection in mother during second trimester of pregnancy TOC today   3. Low-lying placenta Resolved - f/u anatomy on 8/24.   Preterm labor symptoms and general obstetric precautions including but not limited to vaginal bleeding, contractions, leaking of fluid and fetal movement were reviewed in detail with the patient. Please refer to After Visit Summary for other counseling recommendations.   Return in about 4 weeks (around 05/16/2022) for with CNM.  Future Appointments  Date Time Provider Department Center  05/09/2022  2:45 PM [facility] NURSE [facility]  05/09/2022  3:00 PM [facility] US1 [facility]  07/31/2022  9:00 AM Tobb, Lavona Mound, DO [facility]    Milas Hock, MD

## 2022-04-18 ENCOUNTER — Other Ambulatory Visit: Payer: Self-pay | Admitting: Obstetrics and Gynecology

## 2022-04-18 ENCOUNTER — Ambulatory Visit (INDEPENDENT_AMBULATORY_CARE_PROVIDER_SITE_OTHER): Payer: Medicaid Other | Admitting: Obstetrics and Gynecology

## 2022-04-18 VITALS — BP 124/72 | HR 80 | Wt 200.0 lb

## 2022-04-18 DIAGNOSIS — Z3402 Encounter for supervision of normal first pregnancy, second trimester: Secondary | ICD-10-CM

## 2022-04-18 DIAGNOSIS — Z3A20 20 weeks gestation of pregnancy: Secondary | ICD-10-CM

## 2022-04-18 DIAGNOSIS — Z3401 Encounter for supervision of normal first pregnancy, first trimester: Secondary | ICD-10-CM

## 2022-04-18 DIAGNOSIS — O2342 Unspecified infection of urinary tract in pregnancy, second trimester: Secondary | ICD-10-CM

## 2022-04-18 DIAGNOSIS — O4440 Low lying placenta NOS or without hemorrhage, unspecified trimester: Secondary | ICD-10-CM

## 2022-04-18 DIAGNOSIS — O4442 Low lying placenta NOS or without hemorrhage, second trimester: Secondary | ICD-10-CM

## 2022-04-18 NOTE — Patient Instructions (Signed)

## 2022-04-18 NOTE — Addendum Note (Signed)
Addended by: Leola Brazil on: 04/18/2022 04:24 PM   Modules accepted: Orders

## 2022-04-19 ENCOUNTER — Encounter: Payer: Self-pay | Admitting: Obstetrics and Gynecology

## 2022-05-01 ENCOUNTER — Ambulatory Visit (INDEPENDENT_AMBULATORY_CARE_PROVIDER_SITE_OTHER): Payer: Medicaid Other

## 2022-05-01 ENCOUNTER — Other Ambulatory Visit (HOSPITAL_COMMUNITY)
Admission: RE | Admit: 2022-05-01 | Discharge: 2022-05-01 | Disposition: A | Discharge status: Home / Self Care | Payer: Medicaid Other | Source: Ambulatory Visit | Attending: Obstetrics and Gynecology | Admitting: Obstetrics and Gynecology

## 2022-05-01 DIAGNOSIS — Z3401 Encounter for supervision of normal first pregnancy, first trimester: Secondary | ICD-10-CM

## 2022-05-01 DIAGNOSIS — N3000 Acute cystitis without hematuria: Secondary | ICD-10-CM

## 2022-05-01 DIAGNOSIS — B3731 Acute candidiasis of vulva and vagina: Secondary | ICD-10-CM

## 2022-05-01 NOTE — Progress Notes (Signed)
Pt here for self swab and urine culture. Urine culture needed collection from 8/3 appt. Pt requested Aptima self swab. Pt declines STI testing. Pt is aware she will be contacted with results.

## 2022-05-03 LAB — CERVICOVAGINAL ANCILLARY ONLY
Bacterial Vaginitis (gardnerella): NEGATIVE
Candida Glabrata: NEGATIVE
Candida Vaginitis: POSITIVE — AB
Comment: NEGATIVE
Comment: NEGATIVE
Comment: NEGATIVE

## 2022-05-04 LAB — URINE CULTURE, OB REFLEX

## 2022-05-04 LAB — CULTURE, OB URINE

## 2022-05-05 ENCOUNTER — Inpatient Hospital Stay (HOSPITAL_COMMUNITY)
Admission: AD | Admit: 2022-05-05 | Discharge: 2022-05-05 | Disposition: A | Discharge status: Home / Self Care | Payer: Medicaid Other | Attending: Family Medicine | Admitting: Family Medicine

## 2022-05-05 DIAGNOSIS — B3731 Acute candidiasis of vulva and vagina: Secondary | ICD-10-CM

## 2022-05-05 DIAGNOSIS — O2342 Unspecified infection of urinary tract in pregnancy, second trimester: Secondary | ICD-10-CM | POA: Diagnosis present

## 2022-05-05 DIAGNOSIS — O98812 Other maternal infectious and parasitic diseases complicating pregnancy, second trimester: Secondary | ICD-10-CM | POA: Diagnosis not present

## 2022-05-05 DIAGNOSIS — Z3A22 22 weeks gestation of pregnancy: Secondary | ICD-10-CM | POA: Diagnosis not present

## 2022-05-05 DIAGNOSIS — B379 Candidiasis, unspecified: Secondary | ICD-10-CM | POA: Insufficient documentation

## 2022-05-05 DIAGNOSIS — N390 Urinary tract infection, site not specified: Secondary | ICD-10-CM | POA: Insufficient documentation

## 2022-05-05 MED ORDER — CEFADROXIL 500 MG PO CAPS: 500.0000 mg | ORAL_CAPSULE | Freq: Two times a day (BID) | ORAL | 0 refills | Status: AC

## 2022-05-05 MED ORDER — TERCONAZOLE 0.4 % VA CREA: 1.0000 | TOPICAL_CREAM | Freq: Every day | VAGINAL | 0 refills | Status: DC

## 2022-05-05 NOTE — MAU Note (Signed)
Kimberly Lewis is a 26 y.o. at [redacted]w[redacted]d here in MAU reporting: got test results back on her 'My chart", looked it up, saw it can cause septic shock. Wasn't called, no meds were called on.  Called the after hour line and was told to come up here. Has been having back pain, lower back esp left side. No fever, hasn't checked her temp. Sometimes crampy in pelvis and feels heavy- is peeing more than usual.  No bleeding or LOF.  Baby is moving more than usual  Onset of complaint: wk ago Pain score: back: 5 Vitals:   05/05/22 1330  BP: 121/66  Pulse: 98  Resp: 20  Temp: 98.5 F (36.9 C)  SpO2: 99%     FHT:158 Lab orders placed from triage:  none  No CVA tenderness

## 2022-05-05 NOTE — MAU Provider Note (Signed)
None    S [name] is a 26 y.o. G2P1001 patient who presents to MAU today regarding urine culture results. She reports she saw urine culture results and called an on-call nurse line and was told to come in as she was concerned about septic shock. Patient reports increase in frequency with urination, but denies burning/pain with urination. She also has some low back pain x1 week that is intermittent and aggravated by sitting in certain positions, movement and walking. She has not taken anything to relieve back pain. She denies vaginal bleeding, leaking fluid, fever, or chills. No flank pain. Endorses active fetal movement.  Patient receives [facility], next appointment scheduled on 9/5.  O BP 121/66 (BP Location: Right Arm)   Pulse 98   Temp 98.5 F (36.9 C) (Oral)   Resp 20   Ht 5\' 1"  (1.549 m)   Wt 92.4 kg   LMP 11/29/2021   SpO2 99%   BMI 38.51 kg/m  Physical Exam Vitals and nursing note reviewed.  Constitutional:      General: She is not in acute distress.    Appearance: She is obese.  Eyes:     Extraocular Movements: Extraocular movements intact.  Cardiovascular:     Rate and Rhythm: Normal rate.  Pulmonary:     Effort: Pulmonary effort is normal.  Abdominal:     Palpations: Abdomen is soft.     Tenderness: There is no abdominal tenderness. There is no right CVA tenderness or left CVA tenderness.  Musculoskeletal:        General: Normal range of motion.     Cervical back: Normal range of motion.  Skin:    General: Skin is warm and dry.  Neurological:     General: No focal deficit present.     Mental Status: She is alert and oriented to person, place, and time.  Psychiatric:        Mood and Affect: Mood normal.        Behavior: Behavior normal.        Judgment: Judgment normal.    FHR: 158 bpm via doppler  A Medical screening exam complete UTI in pregnancy Yeast infection   P Patient offered Tylenol for back pain- declines Discharge from MAU  in stable condition Rx for UTI and yeast infection sent to pharmacy Recommend support belt for back pain, encouraged hydration, may use heat/ice Patient may return to MAU as needed for new/worsening symptoms   12/01/2021, CNM 05/05/2022 2:02 PM

## 2022-05-06 MED ORDER — SULFAMETHOXAZOLE-TRIMETHOPRIM 800-160 MG PO TABS: 1.0000 | ORAL_TABLET | Freq: Two times a day (BID) | ORAL | 1 refills | Status: DC

## 2022-05-06 NOTE — Addendum Note (Signed)
Addended by: Milas Hock A on: 05/06/2022 09:59 AM   Modules accepted: Orders

## 2022-05-09 ENCOUNTER — Ambulatory Visit: Payer: Medicaid Other | Attending: Obstetrics and Gynecology

## 2022-05-09 ENCOUNTER — Other Ambulatory Visit: Payer: Self-pay | Admitting: *Deleted

## 2022-05-09 ENCOUNTER — Encounter: Payer: Self-pay | Admitting: *Deleted

## 2022-05-09 ENCOUNTER — Ambulatory Visit: Payer: Medicaid Other | Admitting: *Deleted

## 2022-05-09 VITALS — BP 112/59 | HR 98

## 2022-05-09 DIAGNOSIS — O99212 Obesity complicating pregnancy, second trimester: Secondary | ICD-10-CM | POA: Diagnosis present

## 2022-05-09 DIAGNOSIS — Z362 Encounter for other antenatal screening follow-up: Secondary | ICD-10-CM | POA: Diagnosis not present

## 2022-05-09 DIAGNOSIS — O99210 Obesity complicating pregnancy, unspecified trimester: Secondary | ICD-10-CM

## 2022-05-21 ENCOUNTER — Ambulatory Visit (INDEPENDENT_AMBULATORY_CARE_PROVIDER_SITE_OTHER): Payer: Medicaid Other

## 2022-05-21 VITALS — BP 122/75 | HR 96 | Wt 214.0 lb

## 2022-05-21 DIAGNOSIS — E669 Obesity, unspecified: Secondary | ICD-10-CM

## 2022-05-21 DIAGNOSIS — O2342 Unspecified infection of urinary tract in pregnancy, second trimester: Secondary | ICD-10-CM

## 2022-05-21 DIAGNOSIS — Z3480 Encounter for supervision of other normal pregnancy, unspecified trimester: Secondary | ICD-10-CM

## 2022-05-21 DIAGNOSIS — Z3A24 24 weeks gestation of pregnancy: Secondary | ICD-10-CM

## 2022-05-21 NOTE — Progress Notes (Signed)
   PRENATAL VISIT NOTE  Subjective:  Kimberly Lewis is a 26 y.o. G2P1001 at [redacted]w[redacted]d being seen today for ongoing prenatal care.  She is currently monitored for the following issues for this low-risk pregnancy and has Inappropriate sinus tachycardia; Papanicolaou smear of cervix with low grade squamous intraepithelial lesion (LGSIL); Obesity (BMI 35.0-39.9 without comorbidity); MDD (major depressive disorder), recurrent episode (HCC); PCOS (polycystic ovarian syndrome); Posttraumatic stress disorder; Encounter for supervision of normal first pregnancy in first trimester; UTI in pregnancy; and Low-lying placenta on their problem list.  Patient reports no complaints.  Contractions: Not present. Vag. Bleeding: None.  Movement: Present. Denies leaking of fluid.   The following portions of the patient's history were reviewed and updated as appropriate: allergies, current medications, past family history, past medical history, past social history, past surgical history and problem list.   Objective:   Vitals:   05/21/22 1046  BP: 122/75  Pulse: 96  Weight: 214 lb (97.1 kg)    Fetal Status: Fetal Heart Rate (bpm): 150   Movement: Present     General:  Alert, oriented and cooperative. Patient is in no acute distress.  Skin: Skin is warm and dry. No rash noted.   Cardiovascular: Normal heart rate noted  Respiratory: Normal respiratory effort, no problems with respiration noted  Abdomen: Soft, gravid, appropriate for gestational age.  Pain/Pressure: Absent     Pelvic: Cervical exam deferred        Extremities: Normal range of motion.  Edema: None  Mental Status: Normal mood and affect. Normal behavior. Normal judgment and thought content.   Assessment and Plan:  Pregnancy: G2P1001 at [redacted]w[redacted]d 1. Supervision of other normal pregnancy, antepartum - Routine OB. Doing well, no concerns - Interested in waterbirth, questions answered. Pt to enroll in class, see midwives for most visits. Discussed  waterbirth as option for low risk pregnancy. Pregnancy is hard to predict and things may arise that prevent immersion in water but labor can be supported with freedom of movement, birthing ball, shower and birth can be supported in position of pt choosing, etc, even if waterbirth becomes unavailable. Pt states understanding. - GTT and 28 week labs next visit  2. [redacted] weeks gestation of pregnancy - Endorses normal fetal movement  3. Urinary tract infection in mother during second trimester of pregnancy - Patient seen in MAU and given antibiotic, which she finished last week. Reports she had another antibiotic rx that was sent to her house. She did not take that antibiotic. Tried to call office but never got an answer.  - Asymptomatic - TOC at next appointment as it is too soon to retest today  4. Obesity (BMI 35.0-39.9 without comorbidity) - MFM follow up on 9/21  Preterm labor symptoms and general obstetric precautions including but not limited to vaginal bleeding, contractions, leaking of fluid and fetal movement were reviewed in detail with the patient. Please refer to After Visit Summary for other counseling recommendations.   Return in about 4 weeks (around 06/18/2022) for LOB and GTT/labs.  Future Appointments  Date Time Provider Department Center  06/06/2022  3:15 PM [facility] NURSE [facility]  06/06/2022  3:30 PM [facility] US2 [facility]  06/18/2022  8:30 AM Brand Males, CNM [facility]  07/31/2022  9:00 AM Thomasene Ripple, DO [facility] None    Brand Males, CNM

## 2022-05-21 NOTE — Patient Instructions (Signed)
Considering Waterbirth? Guide for patients at [facility] ([facility]) Why consider waterbirth? Gentle birth for babies  Less pain medicine used in labor  May allow for passive descent/less pushing  May reduce perineal tears  More mobility and instinctive maternal position changes  Increased maternal relaxation   Is waterbirth safe? What are the risks of infection, drowning or other complications? Infection:  Very low risk (3.7 % for tub vs 4.8% for bed)  7 in 8000 waterbirths with documented infection  Poorly cleaned equipment most common cause  Slightly lower group B strep transmission rate  Drowning  Maternal:  Very low risk  Related to seizures or fainting  Newborn:  Very low risk. No evidence of increased risk of respiratory problems in multiple large studies  Physiological protection from breathing under water  Avoid underwater birth if there are any fetal complications  Once baby's head is out of the water, keep it out.  Birth complication  Some reports of cord trauma, but risk decreased by bringing baby to surface gradually  No evidence of increased risk of shoulder dystocia. Mothers can usually change positions faster in water than in a bed, possibly aiding the maneuvers to free the shoulder.   There are 2 things you MUST do to have a waterbirth with [facility]: Attend a waterbirth class at [facility]   3rd Wednesday of every month from 7-9 pm (virtual during COVID) Free Register online at [email or web address] or [email or web address] or by calling [phone] Bring us the certificate from the class to your prenatal appointment or send via MyChart Meet with a midwife at 36 weeks* to see if you can still plan a waterbirth and to sign the consent.   *We also recommend that you schedule as many of your prenatal visits with a midwife as possible.    Helpful information: You may want to bring a bathing suit top to the hospital  to wear during labor but this is optional.  All other supplies are provided by the hospital. Please arrive at the hospital with signs of active labor, and do not wait at home until late in labor. It takes 45 min- 1 hour for fetal monitoring, and check in to your room to take place, plus transport and filling of the waterbirth tub.    Things that would prevent you from having a waterbirth: Premature, <37wks  Previous cesarean birth  Presence of thick meconium-stained fluid  Multiple gestation (Twins, triplets, etc.)  Uncontrolled diabetes or gestational diabetes requiring medication  Hypertension diagnosed in pregnancy or preexisting hypertension (gestational hypertension, preeclampsia, or chronic hypertension) Fetal growth restriction (your baby measures less than 10th percentile on ultrasound) Heavy vaginal bleeding  Non-reassuring fetal heart rate  Active infection (MRSA, etc.). Group B Strep is NOT a contraindication for waterbirth.  If your labor has to be induced and induction method requires continuous monitoring of the baby's heart rate  Other risks/issues identified by your obstetrical provider   Please remember that birth is unpredictable. Under certain unforeseeable circumstances your provider may advise against giving birth in the tub. These decisions will be made on a case-by-case basis and with the safety of you and your baby as our highest priority.    Updated 12/19/21   Safe Medications in Pregnancy   Acne:  Benzoyl Peroxide  Salicylic Acid   Backache/Headache:  Tylenol: 2 regular strength every 4 hours OR               2 Extra   strength every 6 hours   Colds/Coughs/Allergies:  Benadryl (alcohol free) 25 mg every 6 hours as needed  Breath right strips  Claritin  Cepacol throat lozenges  Chloraseptic throat spray  Cold-Eeze- up to three times per day  Cough drops, alcohol free  Flonase (by prescription only)  Guaifenesin  Mucinex  Robitussin DM (plain only,  alcohol free)  Saline nasal spray/drops  Sudafed (pseudoephedrine) & Actifed * use only after [redacted] weeks gestation and if you do not have high blood pressure  Tylenol  Vicks Vaporub  Zinc lozenges  Zyrtec   Constipation:  Colace  Ducolax suppositories  Fleet enema  Glycerin suppositories  Metamucil  Milk of magnesia  Miralax  Senokot  Smooth move tea   Diarrhea:  Kaopectate  Imodium A-D   *NO pepto Bismol   Hemorrhoids:  Anusol  Anusol HC  Preparation H  Tucks   Indigestion:  Tums  Maalox  Mylanta  Zantac  Pepcid   Insomnia:  Benadryl (alcohol free) 25mg every 6 hours as needed  Tylenol PM  Unisom, no Gelcaps   Leg Cramps:  Tums  MagGel   Nausea/Vomiting:  Bonine  Dramamine  Emetrol  Ginger extract  Sea bands  Meclizine  Nausea medication to take during pregnancy:  Unisom (doxylamine succinate 25 mg tablets) Take one tablet daily at bedtime. If symptoms are not adequately controlled, the dose can be increased to a maximum recommended dose of two tablets daily (1/2 tablet in the morning, 1/2 tablet mid-afternoon and one at bedtime).  Vitamin B6 100mg tablets. Take one tablet twice a day (up to 200 mg per day).   Skin Rashes:  Aveeno products  Benadryl cream or 25mg every 6 hours as needed  Calamine Lotion  1% cortisone cream   Yeast infection:  Gyne-lotrimin 7  Monistat 7    **If taking multiple medications, please check labels to avoid duplicating the same active ingredients  **take medication as directed on the label  ** Do not exceed 4000 mg of tylenol in 24 hours  **Do not take medications that contain aspirin or ibuprofen           

## 2022-06-04 LAB — CULTURE, OB URINE

## 2022-06-04 LAB — URINE CULTURE, OB REFLEX

## 2022-06-06 ENCOUNTER — Ambulatory Visit: Payer: Medicaid Other | Admitting: *Deleted

## 2022-06-06 ENCOUNTER — Ambulatory Visit: Payer: Medicaid Other | Attending: Maternal & Fetal Medicine

## 2022-06-06 VITALS — BP 116/69 | HR 91

## 2022-06-06 DIAGNOSIS — Z362 Encounter for other antenatal screening follow-up: Secondary | ICD-10-CM | POA: Diagnosis not present

## 2022-06-06 DIAGNOSIS — O99210 Obesity complicating pregnancy, unspecified trimester: Secondary | ICD-10-CM | POA: Diagnosis present

## 2022-06-06 DIAGNOSIS — O4442 Low lying placenta NOS or without hemorrhage, second trimester: Secondary | ICD-10-CM

## 2022-06-06 DIAGNOSIS — O99212 Obesity complicating pregnancy, second trimester: Secondary | ICD-10-CM | POA: Diagnosis not present

## 2022-06-06 DIAGNOSIS — Z3A26 26 weeks gestation of pregnancy: Secondary | ICD-10-CM

## 2022-06-06 DIAGNOSIS — E669 Obesity, unspecified: Secondary | ICD-10-CM

## 2022-06-07 ENCOUNTER — Other Ambulatory Visit: Payer: Self-pay | Admitting: *Deleted

## 2022-06-07 DIAGNOSIS — O99212 Obesity complicating pregnancy, second trimester: Secondary | ICD-10-CM

## 2022-06-11 ENCOUNTER — Encounter (HOSPITAL_COMMUNITY): Payer: Self-pay | Admitting: Obstetrics and Gynecology

## 2022-06-11 ENCOUNTER — Inpatient Hospital Stay (HOSPITAL_COMMUNITY)
Admission: AD | Admit: 2022-06-11 | Discharge: 2022-06-11 | Disposition: A | Discharge status: Home / Self Care | Payer: Medicaid Other | Attending: Obstetrics and Gynecology | Admitting: Obstetrics and Gynecology

## 2022-06-11 ENCOUNTER — Other Ambulatory Visit: Payer: Self-pay

## 2022-06-11 DIAGNOSIS — J069 Acute upper respiratory infection, unspecified: Secondary | ICD-10-CM

## 2022-06-11 DIAGNOSIS — Z3A27 27 weeks gestation of pregnancy: Secondary | ICD-10-CM

## 2022-06-11 DIAGNOSIS — O98512 Other viral diseases complicating pregnancy, second trimester: Secondary | ICD-10-CM | POA: Diagnosis not present

## 2022-06-11 DIAGNOSIS — J029 Acute pharyngitis, unspecified: Secondary | ICD-10-CM | POA: Insufficient documentation

## 2022-06-11 DIAGNOSIS — Z20822 Contact with and (suspected) exposure to covid-19: Secondary | ICD-10-CM | POA: Insufficient documentation

## 2022-06-11 LAB — URINALYSIS, ROUTINE W REFLEX MICROSCOPIC
Bilirubin Urine: NEGATIVE
Glucose, UA: NEGATIVE mg/dL
Hgb urine dipstick: NEGATIVE
Ketones, ur: NEGATIVE mg/dL
Nitrite: NEGATIVE
Protein, ur: NEGATIVE mg/dL
Specific Gravity, Urine: 1.015 (ref 1.005–1.030)
pH: 6 (ref 5.0–8.0)

## 2022-06-11 LAB — URINALYSIS, MICROSCOPIC (REFLEX)

## 2022-06-11 LAB — RESP PANEL BY RT-PCR (FLU A&B, COVID) ARPGX2
Influenza A by PCR: NEGATIVE
Influenza B by PCR: NEGATIVE
SARS Coronavirus 2 by RT PCR: NEGATIVE

## 2022-06-11 LAB — GROUP A STREP BY PCR: Group A Strep by PCR: NOT DETECTED

## 2022-06-11 NOTE — MAU Provider Note (Signed)
History     CSN: [id number]  Arrival date and time: 06/11/22 1627   Event Date/Time   First Provider Initiated Contact with Patient 06/11/22 1741      Chief Complaint  Patient presents with   Sore Throat   Cough   Congestion   HPI [name] is a 26 y.o. year old G33P1001 female at [redacted]w[redacted]d weeks gestation who presents to MAU reporting cough and congestion for 4 weeks and sore throat x 1 week. Her cough is productive with thick, sticky, green and white sputum. She reports her sx's have progressively gotten worse; pain rated 7/10. She reports the only OTC medications she has taken for her sx's is Vick's Vaporub and garlic tea. She reports everyone at home (a total of 7 people) are sick at home; no one has been tested for COVID or be seen for their sx's. She reports (+) FM. She receives [facility] with [facility]; next appt is 06/18/2022.    OB History     Gravida  2   Para  1   Term  1   Preterm  0   AB  0   Living  1      SAB  0   IAB  0   Ectopic  0   Multiple  0   Live Births  1           Past Medical History:  Diagnosis Date   Inappropriate sinus tachycardia    Tachycardia    was taken off medication due to pregnancy-naldolol   Trichomonas infection 2017    Past Surgical History:  Procedure Laterality Date   WISDOM TOOTH EXTRACTION Bilateral     Family History  Problem Relation Age of Onset   Hypertension Maternal Grandmother    Cancer Maternal Grandfather    Cancer Paternal Grandmother    Diabetes Other    Asthma Neg Hx    Heart disease Neg Hx    Stroke Neg Hx     Social History   Tobacco Use   Smoking status: Former    Types: Cigarettes    Passive exposure: Past  Vaping Use   Vaping Use: Never used  Substance Use Topics   Alcohol use: Not Currently   Drug use: Not Currently    Allergies:  Allergies  Allergen Reactions   Aspirin Nausea And Vomiting    abd pain and vomiting abd pain and vomiting     Medications  Prior to Admission  Medication Sig Dispense Refill Last Dose   Prenatal Vit-Fe Fumarate-FA (PRENATAL VITAMIN) 27-0.8 MG TABS Take 1 tablet by mouth daily. 90 tablet 4    Probiotic Product (PROBIOTIC DAILY PO) Take by mouth.       Review of Systems  Constitutional: Negative.   HENT:  Positive for congestion.   Eyes: Negative.   Respiratory:  Positive for cough (thick, sticky sputum) and shortness of breath.   Cardiovascular: Negative.   Gastrointestinal: Negative.   Endocrine: Negative.   Genitourinary: Negative.   Musculoskeletal: Negative.   Skin: Negative.   Allergic/Immunologic: Negative.   Neurological: Negative.   Hematological: Negative.   Psychiatric/Behavioral: Negative.     Physical Exam   Blood pressure 124/66, pulse (!) 103, temperature 98.1 F (36.7 C), temperature source Oral, height 5\' 1"  (1.549 m), weight 101.4 kg, last menstrual period 11/29/2021, SpO2 100 %.  Physical Exam Vitals and nursing note reviewed.  Constitutional:      Appearance: Normal appearance. She is obese.  HENT:  Nose: Congestion present.     Mouth/Throat:     Mouth: Mucous membranes are moist.     Pharynx: Posterior oropharyngeal erythema present.  Cardiovascular:     Rate and Rhythm: Normal rate.  Pulmonary:     Effort: Pulmonary effort is normal.     Breath sounds: Normal breath sounds.  Abdominal:     Palpations: Abdomen is soft.  Genitourinary:    Comments: Not indicated Musculoskeletal:        General: Normal range of motion.  Skin:    General: Skin is warm and dry.  Neurological:     Mental Status: She is alert and oriented to person, place, and time.  Psychiatric:        Mood and Affect: Mood normal.        Behavior: Behavior normal.        Thought Content: Thought content normal.        Judgment: Judgment normal.    REACTIVE NST - FHR: 150 bpm / moderate variability / accels present / decels absent / TOCO: none MAU Course  Procedures  MDM [facility] Respiratory  swab Group A Strep  Results for orders placed or performed during the hospital encounter of 06/11/22 (from the past 24 hour(s))  Group A Strep by PCR     Status: None   Collection Time: 06/11/22  5:20 PM   Specimen: Anterior Nasal Swab; Sterile Swab  Result Value Ref Range   Group A Strep by PCR NOT DETECTED NOT DETECTED  Resp Panel by RT-PCR (Flu A&B, Covid) Anterior Nasal Swab     Status: None   Collection Time: 06/11/22  5:23 PM   Specimen: Anterior Nasal Swab  Result Value Ref Range   SARS Coronavirus 2 by RT PCR NEGATIVE NEGATIVE   Influenza A by PCR NEGATIVE NEGATIVE   Influenza B by PCR NEGATIVE NEGATIVE  Urinalysis, Routine w reflex microscopic Urine, Clean Catch     Status: Abnormal   Collection Time: 06/11/22  5:37 PM  Result Value Ref Range   Color, Urine YELLOW YELLOW   APPearance CLEAR CLEAR   Specific Gravity, Urine 1.015 1.005 - 1.030   pH 6.0 5.0 - 8.0   Glucose, UA NEGATIVE NEGATIVE mg/dL   Hgb urine dipstick NEGATIVE NEGATIVE   Bilirubin Urine NEGATIVE NEGATIVE   Ketones, ur NEGATIVE NEGATIVE mg/dL   Protein, ur NEGATIVE NEGATIVE mg/dL   Nitrite NEGATIVE NEGATIVE   Leukocytes,Ua TRACE (A) NEGATIVE  Urinalysis, Microscopic (reflex)     Status: Abnormal   Collection Time: 06/11/22  5:37 PM  Result Value Ref Range   RBC / HPF 0-5 0 - 5 RBC/hpf   WBC, UA 6-10 0 - 5 WBC/hpf   Bacteria, UA FEW (A) NONE SEEN   Squamous Epithelial / LPF 0-5 0 - 5   Mucus PRESENT    Amorphous Crystal PRESENT     Assessment and Plan  1. Upper respiratory tract infection, unspecified type - Information provided on safe medications in pregnancy list given - Advised to increase water intake, rest and try to isolate at home to lessen the chances of continuing to spread the virus she may have.   2. [redacted] weeks gestation of pregnancy  - Discharge patient - Keep scheduled appt with [facility] on 06/18/2022 - Patient verbalized an understanding of the plan of care and agrees.   Raelyn Mora, CNM 06/11/2022, 5:41 PM

## 2022-06-11 NOTE — MAU Note (Signed)
Kimberly Lewis is a 26 y.o. at [redacted]w[redacted]d here in MAU reporting: she's had congestion and cough for the past 4 weeks and a sore throat for 1 week.  Reports symptoms having progressively gotten worse.  States hasn't taken any OTC meds to treat symptoms. Denies VB or LOF.  Endorses +FM. LMP: N/A Onset of complaint: 1-4 weeks Pain score: 7 Vitals:   06/11/22 1716  BP: 124/66  Pulse: (!) 103  Temp: 98.1 F (36.7 C)  SpO2: 100%     JOI:TGPQDIYM d/t maternal apparel Lab orders placed from triage:   UA

## 2022-06-11 NOTE — Discharge Instructions (Signed)

## 2022-06-11 NOTE — MAU Provider Note (Signed)
History     CSN: [id number]  Arrival date and time: 06/11/22 1627   Event Date/Time   First Provider Initiated Contact with Patient 06/11/22 1807      Chief Complaint  Patient presents with   Sore Throat   Cough   Congestion   HPI  Shantae Vantol is a pleasant 26yo female G2P1001 at [redacted]w[redacted]d who presents to MAU with cough and congestion x3 weeks and ST for x1 week. Patient states she had 3 days of symptoms in the beginning that began to resolve over a couple of days. She states that the past two weeks her symptoms have been worsening. Everyone in her house (6 people) has been sick with similar symptoms. No one in the house has been tested or treated. She has not tried anything OTC except Vick's and garlic tea which helped only temporarily.   Past Medical History:  Diagnosis Date   Inappropriate sinus tachycardia    Tachycardia    was taken off medication due to pregnancy-naldolol   Trichomonas infection 2017    Past Surgical History:  Procedure Laterality Date   WISDOM TOOTH EXTRACTION Bilateral     Family History  Problem Relation Age of Onset   Hypertension Maternal Grandmother    Cancer Maternal Grandfather    Cancer Paternal Grandmother    Diabetes Other    Asthma Neg Hx    Heart disease Neg Hx    Stroke Neg Hx     Social History   Tobacco Use   Smoking status: Former    Types: Cigarettes    Passive exposure: Past  Vaping Use   Vaping Use: Never used  Substance Use Topics   Alcohol use: Not Currently   Drug use: Not Currently    Allergies:  Allergies  Allergen Reactions   Aspirin Nausea And Vomiting    abd pain and vomiting abd pain and vomiting     Medications Prior to Admission  Medication Sig Dispense Refill Last Dose   Prenatal Vit-Fe Fumarate-FA (PRENATAL VITAMIN) 27-0.8 MG TABS Take 1 tablet by mouth daily. 90 tablet 4 06/11/2022   Probiotic Product (PROBIOTIC DAILY PO) Take by mouth.   06/10/2022    Review of Systems  Constitutional:   Negative for fatigue and fever.  HENT:  Positive for congestion, ear pain, postnasal drip and sore throat. Negative for rhinorrhea, sinus pressure, sinus pain, sneezing and trouble swallowing.   Respiratory:  Positive for cough and shortness of breath. Negative for wheezing.   Cardiovascular:  Negative for chest pain.  Gastrointestinal:  Negative for abdominal pain, diarrhea, nausea and vomiting.   Physical Exam   Blood pressure 132/71, pulse 99, temperature 98.1 F (36.7 C), temperature source Oral, height 5\' 1"  (1.549 m), weight 101.4 kg, last menstrual period 11/29/2021, SpO2 100 %.  Physical Exam Constitutional:      Appearance: Normal appearance.  HENT:     Head: Normocephalic and atraumatic.     Mouth/Throat:     Mouth: Mucous membranes are moist.     Pharynx: Posterior oropharyngeal erythema present.  Eyes:     Extraocular Movements: Extraocular movements intact.  Cardiovascular:     Rate and Rhythm: Normal rate and regular rhythm.  Pulmonary:     Effort: Pulmonary effort is normal. No respiratory distress.     Breath sounds: Normal breath sounds. No wheezing, rhonchi or rales.  Musculoskeletal:     Cervical back: Neck supple.  Skin:    General: Skin is warm and dry.  Neurological:  General: No focal deficit present.     Mental Status: She is alert and oriented to person, place, and time.  Psychiatric:        Mood and Affect: Mood normal.        Behavior: Behavior normal.     MAU Course  Procedures  Results for orders placed or performed during the hospital encounter of 06/11/22 (from the past 24 hour(s))  Group A Strep by PCR     Status: None   Collection Time: 06/11/22  5:20 PM   Specimen: Anterior Nasal Swab; Sterile Swab  Result Value Ref Range   Group A Strep by PCR NOT DETECTED NOT DETECTED  Resp Panel by RT-PCR (Flu A&B, Covid) Anterior Nasal Swab     Status: None   Collection Time: 06/11/22  5:23 PM   Specimen: Anterior Nasal Swab  Result Value Ref  Range   SARS Coronavirus 2 by RT PCR NEGATIVE NEGATIVE   Influenza A by PCR NEGATIVE NEGATIVE   Influenza B by PCR NEGATIVE NEGATIVE  Urinalysis, Routine w reflex microscopic Urine, Clean Catch     Status: Abnormal   Collection Time: 06/11/22  5:37 PM  Result Value Ref Range   Color, Urine YELLOW YELLOW   APPearance CLEAR CLEAR   Specific Gravity, Urine 1.015 1.005 - 1.030   pH 6.0 5.0 - 8.0   Glucose, UA NEGATIVE NEGATIVE mg/dL   Hgb urine dipstick NEGATIVE NEGATIVE   Bilirubin Urine NEGATIVE NEGATIVE   Ketones, ur NEGATIVE NEGATIVE mg/dL   Protein, ur NEGATIVE NEGATIVE mg/dL   Nitrite NEGATIVE NEGATIVE   Leukocytes,Ua TRACE (A) NEGATIVE  Urinalysis, Microscopic (reflex)     Status: Abnormal   Collection Time: 06/11/22  5:37 PM  Result Value Ref Range   RBC / HPF 0-5 0 - 5 RBC/hpf   WBC, UA 6-10 0 - 5 WBC/hpf   Bacteria, UA FEW (A) NONE SEEN   Squamous Epithelial / LPF 0-5 0 - 5   Mucus PRESENT    Amorphous Crystal PRESENT      MDM COVID, Flu, and Strep tests were negative. Discussed results with patient. Explained to patient that expectant management includes: pushing fluids, bed rest, and symptom management. Patient was given list of medications okay to take during pregnancy.  Assessment and Plan  Assessment: Viral URI  Plan: - Stay home, push fluids, rest, take pregnancy-approved medications as needed for symptomatic relief - RTC if symptoms worsen or persist - Follow up with prenatal care in 1 week - Disposition: discharge home in stable condition  Aurea Graff 06/11/2022, 6:20 PM

## 2022-06-18 ENCOUNTER — Ambulatory Visit (INDEPENDENT_AMBULATORY_CARE_PROVIDER_SITE_OTHER): Payer: Medicaid Other

## 2022-06-18 VITALS — BP 115/73 | HR 74 | Wt 220.0 lb

## 2022-06-18 DIAGNOSIS — E669 Obesity, unspecified: Secondary | ICD-10-CM

## 2022-06-18 DIAGNOSIS — Z3A28 28 weeks gestation of pregnancy: Secondary | ICD-10-CM | POA: Diagnosis not present

## 2022-06-18 DIAGNOSIS — Z3402 Encounter for supervision of normal first pregnancy, second trimester: Secondary | ICD-10-CM

## 2022-06-18 DIAGNOSIS — Z3401 Encounter for supervision of normal first pregnancy, first trimester: Secondary | ICD-10-CM

## 2022-06-18 DIAGNOSIS — O2342 Unspecified infection of urinary tract in pregnancy, second trimester: Secondary | ICD-10-CM

## 2022-06-18 NOTE — Progress Notes (Signed)
   PRENATAL VISIT NOTE  Subjective:  Kimberly Lewis is a 26 y.o. G2P1001 at [redacted]w[redacted]d being seen today for ongoing prenatal care.  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  Patient reports no complaints.  Contractions: Not present. Vag. Bleeding: None.  Movement: Present. Denies leaking of fluid.   The following portions of the patient's history were reviewed and updated as appropriate: allergies, current medications, past family history, past medical history, past social history, past surgical history and problem list.   Objective:   Vitals:   06/18/22 0824  BP: 115/73  Pulse: 74  Weight: 220 lb (99.8 kg)    Fetal Status: Fetal Heart Rate (bpm): 144   Movement: Present     General:  Alert, oriented and cooperative. Patient is in no acute distress.  Skin: Skin is warm and dry. No rash noted.   Cardiovascular: Normal heart rate noted  Respiratory: Normal respiratory effort, no problems with respiration noted  Abdomen: Soft, gravid, appropriate for gestational age.  Pain/Pressure: Present     Pelvic: Cervical exam deferred        Extremities: Normal range of motion.  Edema: None  Mental Status: Normal mood and affect. Normal behavior. Normal judgment and thought content.   Assessment and Plan:  Pregnancy: G2P1001 at [redacted]w[redacted]d 1. Encounter for supervision of normal first pregnancy in first trimester - Routine OB. Doing well, no concerns - GTT and labs today - Anticipatory guidance for upcoming appointments provided  - 2Hr GTT  w/ 1 Hr Carpenter 75 g - CBC - HIV antibody (with reflex) - RPR  2. [redacted] weeks gestation of pregnancy - Endorses active fetal movement   3. UTI (urinary tract infection) during pregnancy, second trimester - Asymptomatic - TOC today  4. Obesity (BMI 35.0-39.9 without comorbidity)   Preterm labor symptoms and general obstetric precautions including but not limited to vaginal bleeding, contractions, leaking of fluid and fetal movement were reviewed in detail with the patient. Please refer to After Visit Summary for other counseling recommendations.   Return in about 2 weeks (around 07/02/2022) for LOB.  Future Appointments  Date Time Provider [place]  07/02/2022  8:30 AM Amelie Caracci, [place] [facility]  07/05/2022  9:15 AM [facility] NURSE [facility]  07/05/2022  9:30 AM [facility] US2 [facility]  07/31/2022  9:00 AM Tobb, Godfrey Pick, DO [facility] None    Renee Harder, CNM

## 2022-06-19 LAB — CBC
HCT: 36.2 % (ref 35.0–45.0)
Hemoglobin: 12.2 g/dL (ref 11.7–15.5)
MCH: 29.4 pg (ref 27.0–33.0)
MCHC: 33.7 g/dL (ref 32.0–36.0)
MCV: 87.2 fL (ref 80.0–100.0)
MPV: 9.5 fL (ref 7.5–12.5)
Platelets: 258 10*3/uL (ref 140–400)
RBC: 4.15 10*6/uL (ref 3.80–5.10)
RDW: 12.5 % (ref 11.0–15.0)
WBC: 10.7 10*3/uL (ref 3.8–10.8)

## 2022-06-19 LAB — 2HR GTT W 1 HR, CARPENTER, 75 G
Glucose, 1 Hr, Gest: 162 mg/dL (ref 65–179)
Glucose, 2 Hr, Gest: 129 mg/dL (ref 65–152)
Glucose, Fasting, Gest: 77 mg/dL (ref 65–91)

## 2022-06-19 LAB — HIV ANTIBODY (ROUTINE TESTING W REFLEX): HIV 1&2 Ab, 4th Generation: NONREACTIVE

## 2022-06-19 LAB — RPR: RPR Ser Ql: NONREACTIVE

## 2022-06-22 LAB — URINE CULTURE, OB REFLEX

## 2022-06-22 LAB — CULTURE, OB URINE

## 2022-07-02 ENCOUNTER — Ambulatory Visit (INDEPENDENT_AMBULATORY_CARE_PROVIDER_SITE_OTHER): Payer: Medicaid Other

## 2022-07-02 VITALS — BP 102/68 | HR 102 | Wt 230.0 lb

## 2022-07-02 DIAGNOSIS — Z3480 Encounter for supervision of other normal pregnancy, unspecified trimester: Secondary | ICD-10-CM

## 2022-07-02 DIAGNOSIS — Z3A30 30 weeks gestation of pregnancy: Secondary | ICD-10-CM

## 2022-07-02 DIAGNOSIS — O2342 Unspecified infection of urinary tract in pregnancy, second trimester: Secondary | ICD-10-CM

## 2022-07-02 DIAGNOSIS — I4711 Inappropriate sinus tachycardia, so stated: Secondary | ICD-10-CM

## 2022-07-02 DIAGNOSIS — E669 Obesity, unspecified: Secondary | ICD-10-CM

## 2022-07-02 NOTE — Progress Notes (Signed)
   PRENATAL VISIT NOTE  Subjective:  Kimberly Lewis is a 26 y.o. G2P1001 at [redacted]w[redacted]d being seen today for ongoing prenatal care.  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  Patient reports no complaints.  Contractions: Not present. Vag. Bleeding: None.  Movement: Present. Denies leaking of fluid.   The following portions of the patient's history were reviewed and updated as appropriate: allergies, current medications, past family history, past medical history, past social history, past surgical history and problem list.   Objective:   Vitals:   07/02/22 0833  BP: 102/68  Pulse: (!) 102  Weight: 230 lb (104.3 kg)    Fetal Status: Fetal Heart Rate (bpm): 162   Movement: Present     General:  Alert, oriented and cooperative. Patient is in no acute distress.  Skin: Skin is warm and dry. No rash noted.   Cardiovascular: Normal heart rate noted  Respiratory: Normal respiratory effort, no problems with respiration noted  Abdomen: Soft, gravid, appropriate for gestational age.  Pain/Pressure: Present     Pelvic: Cervical exam deferred        Extremities: Normal range of motion.  Edema: None  Mental Status: Normal mood and affect. Normal behavior. Normal judgment and thought content.   Assessment and Plan:  Pregnancy: G2P1001 at [redacted]w[redacted]d 1. Supervision of other normal pregnancy, antepartum - Routine OB. Doing well, no concerns - Reviewed birth plan. Patient desires low intervention. Previously desired water birth but reviewed with  patient that given history of sinus tach requiring medications, not an appropriate candidate. She is okay with not having water birth. She will continue to work on birth plan - Anticipatory guidance for upcoming appointments provided  2. [redacted] weeks gestation of pregnancy - Endorses active fetal movement  3. Urinary tract infection in mother during second trimester of pregnancy - TOC next appointment. If culture still positive, may need suppression therapy  4. Inappropriate sinus tachycardia - Not taking medications as prescribed - Asymptomatic - Has cardiology appt next month  5. Obesity (BMI 35.0-39.9 without comorbidity) - Growth [place] on 10/20  Preterm labor symptoms and general obstetric precautions including but not limited to vaginal bleeding, contractions, leaking of fluid and fetal movement were reviewed in detail with the patient. Please refer to After Visit Summary for other counseling recommendations.   Return in about 2 weeks (around 07/16/2022) for LOB.  Future Appointments  Date Time Provider [place]  07/05/2022  9:15 AM [facility] NURSE [facility]  07/05/2022  9:30 AM [facility] US2 [facility]  07/16/2022 11:10 AM Rasch, Artist Pais, NP [facility]  07/30/2022  9:30 AM Rasch, Artist Pais, NP [facility]  07/31/2022  9:00 AM Berniece Salines, DO [facility] None  08/13/2022  9:30 AM Rasch, Artist Pais, NP [facility]    Renee Harder, CNM

## 2022-07-05 ENCOUNTER — Other Ambulatory Visit: Payer: Self-pay | Admitting: *Deleted

## 2022-07-05 ENCOUNTER — Ambulatory Visit: Payer: Medicaid Other | Attending: Maternal & Fetal Medicine

## 2022-07-05 ENCOUNTER — Other Ambulatory Visit: Payer: Self-pay | Admitting: Maternal & Fetal Medicine

## 2022-07-05 ENCOUNTER — Ambulatory Visit: Payer: Medicaid Other | Admitting: *Deleted

## 2022-07-05 VITALS — BP 118/59 | HR 97

## 2022-07-05 DIAGNOSIS — Z3A30 30 weeks gestation of pregnancy: Secondary | ICD-10-CM | POA: Diagnosis not present

## 2022-07-05 DIAGNOSIS — O99213 Obesity complicating pregnancy, third trimester: Secondary | ICD-10-CM | POA: Diagnosis not present

## 2022-07-05 DIAGNOSIS — O4443 Low lying placenta NOS or without hemorrhage, third trimester: Secondary | ICD-10-CM | POA: Diagnosis not present

## 2022-07-05 DIAGNOSIS — O99212 Obesity complicating pregnancy, second trimester: Secondary | ICD-10-CM

## 2022-07-05 DIAGNOSIS — Z3689 Encounter for other specified antenatal screening: Secondary | ICD-10-CM | POA: Diagnosis present

## 2022-07-05 DIAGNOSIS — E669 Obesity, unspecified: Secondary | ICD-10-CM | POA: Diagnosis not present

## 2022-07-16 ENCOUNTER — Ambulatory Visit (INDEPENDENT_AMBULATORY_CARE_PROVIDER_SITE_OTHER): Payer: Medicaid Other | Admitting: Obstetrics and Gynecology

## 2022-07-16 VITALS — BP 121/75 | HR 101 | Wt 233.0 lb

## 2022-07-16 DIAGNOSIS — O4440 Low lying placenta NOS or without hemorrhage, unspecified trimester: Secondary | ICD-10-CM

## 2022-07-16 DIAGNOSIS — O4443 Low lying placenta NOS or without hemorrhage, third trimester: Secondary | ICD-10-CM

## 2022-07-16 DIAGNOSIS — I4711 Inappropriate sinus tachycardia, so stated: Secondary | ICD-10-CM

## 2022-07-16 DIAGNOSIS — Z3401 Encounter for supervision of normal first pregnancy, first trimester: Secondary | ICD-10-CM

## 2022-07-16 DIAGNOSIS — Z3403 Encounter for supervision of normal first pregnancy, third trimester: Secondary | ICD-10-CM

## 2022-07-16 NOTE — Progress Notes (Signed)
   PRENATAL VISIT NOTE  Subjective:  Kimberly Lewis is a 26 y.o. G2P1001 at [redacted]w[redacted]d being seen today for ongoing prenatal care.  She is currently monitored for the following issues for this low-risk pregnancy and has Inappropriate sinus tachycardia; Papanicolaou smear of cervix with low grade squamous intraepithelial lesion (LGSIL); Obesity (BMI 35.0-39.9 without comorbidity); MDD (major depressive disorder), recurrent episode ([place]); PCOS (polycystic ovarian syndrome); Posttraumatic stress disorder; Encounter for supervision of normal first pregnancy in first trimester; UTI in pregnancy; and Low-lying placenta on their problem list.  Patient reports no complaints.  Contractions: Not present. Vag. Bleeding: None.  Movement: Present. Denies leaking of fluid.   The following portions of the patient's history were reviewed and updated as appropriate: allergies, current medications, past family history, past medical history, past social history, past surgical history and problem list.   Objective:   Vitals:   07/16/22 1106  BP: 121/75  Pulse: (!) 101  Weight: 233 lb (105.7 kg)    Fetal Status: Fetal Heart Rate (bpm): 140 Fundal Height: 33 cm Movement: Present     General:  Alert, oriented and cooperative. Patient is in no acute distress.  Skin: Skin is warm and dry. No rash noted.   Cardiovascular: Normal heart rate noted  Respiratory: Normal respiratory effort, no problems with respiration noted  Abdomen: Soft, gravid, appropriate for gestational age.  Pain/Pressure: Present     Pelvic: Cervical exam deferred        Extremities: Normal range of motion.  Edema: Trace  Mental Status: Normal mood and affect. Normal behavior. Normal judgment and thought content.   Assessment and Plan:  Pregnancy: G2P1001 at [redacted]w[redacted]d   1. Encounter for supervision of normal first pregnancy in first trimester  Doing well Would like to think about TDAP Allergic to BASA   2. Inappropriate sinus  tachycardia  Not interested in Seeing Dr. Harriet Masson for f/u.  Hr is stable here Plans to call [facility] cards for appointment   3. Low-lying placenta  3.27 cm away from the internal os. Has f/u MFM appt.  Preterm labor symptoms and general obstetric precautions including but not limited to vaginal bleeding, contractions, leaking of fluid and fetal movement were reviewed in detail with the patient. Please refer to After Visit Summary for other counseling recommendations.   No follow-ups on file.  Future Appointments  Date Time Provider [place]  07/30/2022  9:30 AM Deisi Salonga, Artist Pais, NP [facility]  07/31/2022  9:00 AM Berniece Salines, DO [facility] None  07/31/2022  3:30 PM [facility] NURSE [facility]  07/31/2022  3:45 PM [facility] US6 [facility]  08/13/2022  9:30 AM Hailley Byers, Artist Pais, NP [facility]  08/30/2022  9:15 AM [facility] NURSE [facility]  08/30/2022  9:30 AM [facility] US3 [facility] [place], NP

## 2022-07-29 ENCOUNTER — Encounter (HOSPITAL_COMMUNITY): Payer: Self-pay | Admitting: Obstetrics and Gynecology

## 2022-07-29 ENCOUNTER — Inpatient Hospital Stay (HOSPITAL_COMMUNITY)
Admission: AD | Admit: 2022-07-29 | Discharge: 2022-07-29 | Disposition: A | Discharge status: Home / Self Care | Payer: Medicaid Other | Attending: Family Medicine | Admitting: Family Medicine

## 2022-07-29 DIAGNOSIS — N3000 Acute cystitis without hematuria: Secondary | ICD-10-CM | POA: Diagnosis not present

## 2022-07-29 DIAGNOSIS — R102 Pelvic and perineal pain: Secondary | ICD-10-CM | POA: Insufficient documentation

## 2022-07-29 DIAGNOSIS — Z3A34 34 weeks gestation of pregnancy: Secondary | ICD-10-CM | POA: Diagnosis not present

## 2022-07-29 DIAGNOSIS — B3731 Acute candidiasis of vulva and vagina: Secondary | ICD-10-CM | POA: Insufficient documentation

## 2022-07-29 DIAGNOSIS — O26893 Other specified pregnancy related conditions, third trimester: Secondary | ICD-10-CM | POA: Diagnosis not present

## 2022-07-29 DIAGNOSIS — O2313 Infections of bladder in pregnancy, third trimester: Secondary | ICD-10-CM | POA: Diagnosis not present

## 2022-07-29 DIAGNOSIS — O98813 Other maternal infectious and parasitic diseases complicating pregnancy, third trimester: Secondary | ICD-10-CM | POA: Insufficient documentation

## 2022-07-29 DIAGNOSIS — O23593 Infection of other part of genital tract in pregnancy, third trimester: Secondary | ICD-10-CM | POA: Insufficient documentation

## 2022-07-29 LAB — URINALYSIS, ROUTINE W REFLEX MICROSCOPIC
Bilirubin Urine: NEGATIVE
Glucose, UA: NEGATIVE mg/dL
Hgb urine dipstick: NEGATIVE
Ketones, ur: NEGATIVE mg/dL
Leukocytes,Ua: NEGATIVE
Nitrite: POSITIVE — AB
Protein, ur: 30 mg/dL — AB
Specific Gravity, Urine: 1.012 (ref 1.005–1.030)
pH: 6 (ref 5.0–8.0)

## 2022-07-29 LAB — WET PREP, GENITAL
Clue Cells Wet Prep HPF POC: NONE SEEN
Sperm: NONE SEEN
Trich, Wet Prep: NONE SEEN
WBC, Wet Prep HPF POC: >=10 — AB (ref <10)

## 2022-07-29 MED ORDER — FLUCONAZOLE 150 MG PO TABS: 150.0000 mg | ORAL_TABLET | Freq: Once | ORAL | 0 refills | Status: AC

## 2022-07-29 MED ORDER — FOSFOMYCIN TROMETHAMINE 3 G PO PACK: 3.0000 g | PACK | Freq: Once | ORAL | 0 refills | Status: AC

## 2022-07-29 MED ORDER — CEFADROXIL 500 MG PO CAPS: 500.0000 mg | ORAL_CAPSULE | Freq: Two times a day (BID) | ORAL | 0 refills | Status: DC

## 2022-07-29 NOTE — MAU Provider Note (Signed)
History     CSN: [id number]  Arrival date and time: 07/29/22 1506   None     Chief Complaint  Patient presents with   Pelvic Pain   Pelvic Pain The patient's primary symptoms include pelvic pain. The patient's pertinent negatives include no vaginal discharge. Associated symptoms include nausea. Pertinent negatives include no abdominal pain, chills, dysuria, fever or vomiting. There has been no bleeding.   Patient is a female G2P1001 at [redacted]w[redacted]d presenting to MAU with pelvic pain and pressure. Patient reports constant dull pressure localized to the pelvis for the last 3 days. Pain worsens with movement. She denies experiencing this type of pressure with her past pregnancy. She has not tried any remedies to relieve her pain.  OB History     Gravida  2   Para  1   Term  1   Preterm  0   AB  0   Living  1      SAB  0   IAB  0   Ectopic  0   Multiple  0   Live Births  1           Past Medical History:  Diagnosis Date   Inappropriate sinus tachycardia    Tachycardia    was taken off medication due to pregnancy-naldolol   Trichomonas infection 2017    Past Surgical History:  Procedure Laterality Date   WISDOM TOOTH EXTRACTION Bilateral     Family History  Problem Relation Age of Onset   Hypertension Maternal Grandmother    Cancer Maternal Grandfather    Cancer Paternal Grandmother    Diabetes Other    Asthma Neg Hx    Heart disease Neg Hx    Stroke Neg Hx     Social History   Tobacco Use   Smoking status: Former    Types: Cigarettes    Passive exposure: Past  Vaping Use   Vaping Use: Never used  Substance Use Topics   Alcohol use: Not Currently   Drug use: Not Currently    Allergies:  Allergies  Allergen Reactions   Aspirin Nausea And Vomiting    abd pain and vomiting abd pain and vomiting     Medications Prior to Admission  Medication Sig Dispense Refill Last Dose   Prenatal Vit-Fe Fumarate-FA (PRENATAL VITAMIN) 27-0.8 MG TABS  Take 1 tablet by mouth daily. 90 tablet 4 07/28/2022   Probiotic Product (PROBIOTIC DAILY PO) Take by mouth.       Review of Systems  Constitutional:  Negative for chills and fever.  Respiratory:  Negative for cough and shortness of breath.   Cardiovascular:  Negative for chest pain and palpitations.  Gastrointestinal:  Positive for nausea. Negative for abdominal pain and vomiting.  Genitourinary:  Positive for pelvic pain. Negative for dysuria, vaginal bleeding, vaginal discharge and vaginal pain.   Physical Exam   Blood pressure 121/66, pulse 94, temperature 98.3 F (36.8 C), resp. rate 18, height 5\' 1"  (1.549 m), weight 107 kg, last menstrual period 11/29/2021, SpO2 99 %.  Physical Exam Vitals reviewed.  HENT:     Head: Normocephalic and atraumatic.  Pulmonary:     Effort: Pulmonary effort is normal. No respiratory distress.  Abdominal:     Tenderness: There is abdominal tenderness in the suprapubic area.  Skin:    General: Skin is warm and dry.  Neurological:     General: No focal deficit present.     Mental Status: She is alert.  Psychiatric:  Attention and Perception: Attention normal.        Speech: Speech normal.        Behavior: Behavior is cooperative.     MAU Course  Procedures  Results for orders placed or performed during the hospital encounter of 07/29/22 (from the past 24 hour(s))  Urinalysis, Routine w reflex microscopic Urine, Clean Catch     Status: Abnormal   Collection Time: 07/29/22  3:51 PM  Result Value Ref Range   Color, Urine YELLOW YELLOW   APPearance HAZY (A) CLEAR   Specific Gravity, Urine 1.012 1.005 - 1.030   pH 6.0 5.0 - 8.0   Glucose, UA NEGATIVE NEGATIVE mg/dL   Hgb urine dipstick NEGATIVE NEGATIVE   Bilirubin Urine NEGATIVE NEGATIVE   Ketones, ur NEGATIVE NEGATIVE mg/dL   Protein, ur 30 (A) NEGATIVE mg/dL   Nitrite POSITIVE (A) NEGATIVE   Leukocytes,Ua NEGATIVE NEGATIVE   RBC / HPF 6-10 0 - 5 RBC/hpf   WBC, UA 11-20 0 - 5  WBC/hpf   Bacteria, UA MANY (A) NONE SEEN   Squamous Epithelial / LPF 0-5 0 - 5   Mucus PRESENT   Wet prep, genital     Status: Abnormal   Collection Time: 07/29/22  4:14 PM  Result Value Ref Range   Yeast Wet Prep HPF POC PRESENT (A) NONE SEEN   Trich, Wet Prep NONE SEEN NONE SEEN   Clue Cells Wet Prep HPF POC NONE SEEN NONE SEEN   WBC, Wet Prep HPF POC >=10 (A) <10   Sperm NONE SEEN      MDM  FHT stable at 145bpm with moderate variability. No contractions at this time. UA shows evidence of UTI. Yeast present on wet prep. Results were discussed with patient. Patient is agreeable to medical treatment with cephadroxil and fluconazole.     Assessment and Plan  [redacted] weeks gestation  - maintain scheduled prenatal appointment on 07/29/2022   2. Acute cystitis  - start cephadroxil  500mg  BID PO x 7 days  - maintain adequate hydration with 60-80 oz water daily.  - return to clinic should symptoms worsen or patient develops flank pain  3. Candidiasis vaginitis  - start fluconazole 150 mg PO one time.   Disposition: discharge home in stable condition.   07/29/2022, 5:32 PM

## 2022-07-29 NOTE — MAU Note (Signed)
.  Kimberly Lewis is a 26 y.o. at [redacted]w[redacted]d here in MAU reporting: pelvic pain and pressure x 3 days. Thinks it might be a UTI.  Reports decreased fetal movement today. Deneis any vag bleeding or discharge.   Onset of complaint: 3 days Pain score: 8 Vitals:   07/29/22 1522  BP: 129/67  Pulse: 96  Resp: 18  Temp: 98.3 F (36.8 C)     FHT:148 Lab orders placed from triage:  u/a

## 2022-07-30 ENCOUNTER — Ambulatory Visit (INDEPENDENT_AMBULATORY_CARE_PROVIDER_SITE_OTHER): Payer: Medicaid Other | Admitting: Obstetrics and Gynecology

## 2022-07-30 VITALS — BP 113/74 | HR 96 | Wt 235.0 lb

## 2022-07-30 DIAGNOSIS — Z3A34 34 weeks gestation of pregnancy: Secondary | ICD-10-CM | POA: Diagnosis not present

## 2022-07-30 DIAGNOSIS — Z3401 Encounter for supervision of normal first pregnancy, first trimester: Secondary | ICD-10-CM

## 2022-07-30 DIAGNOSIS — Z23 Encounter for immunization: Secondary | ICD-10-CM

## 2022-07-30 DIAGNOSIS — I4711 Inappropriate sinus tachycardia, so stated: Secondary | ICD-10-CM

## 2022-07-30 DIAGNOSIS — Z3403 Encounter for supervision of normal first pregnancy, third trimester: Secondary | ICD-10-CM

## 2022-07-30 DIAGNOSIS — N390 Urinary tract infection, site not specified: Secondary | ICD-10-CM | POA: Insufficient documentation

## 2022-07-30 LAB — GC/CHLAMYDIA PROBE AMP (~~LOC~~) NOT AT ARMC
Chlamydia: NEGATIVE
Comment: NEGATIVE
Comment: NORMAL
Neisseria Gonorrhea: NEGATIVE

## 2022-07-30 MED ORDER — CEPHALEXIN 500 MG PO CAPS: 500.0000 mg | ORAL_CAPSULE | Freq: Once | ORAL | 0 refills | Status: AC

## 2022-07-30 NOTE — Progress Notes (Signed)
   PRENATAL VISIT NOTE  Subjective:  Kimberly Lewis is a 26 y.o. G2P1001 at [redacted]w[redacted]d being seen today for ongoing prenatal care.  She is currently monitored for the following issues for this low-risk pregnancy and has Inappropriate sinus tachycardia; Papanicolaou smear of cervix with low grade squamous intraepithelial lesion (LGSIL); Obesity (BMI 35.0-39.9 without comorbidity); MDD (major depressive disorder), recurrent episode ([facility]); PCOS (polycystic ovarian syndrome); Posttraumatic stress disorder; Encounter for supervision of normal first pregnancy in first trimester; UTI in pregnancy; Low-lying placenta; and Recurrent UTI on their problem list.  Patient reports  pelvic pain/round ligament pain .  Contractions: Not present. Vag. Bleeding: None.  Movement: Present. Denies leaking of fluid.   The following portions of the patient's history were reviewed and updated as appropriate: allergies, current medications, past family history, past medical history, past social history, past surgical history and problem list.   Objective:   Vitals:   07/30/22 0943  BP: 113/74  Pulse: 96  Weight: 235 lb (106.6 kg)    Fetal Status: Fetal Heart Rate (bpm): 140 Fundal Height: 36 cm Movement: Present     General:  Alert, oriented and cooperative. Patient is in no acute distress.  Skin: Skin is warm and dry. No rash noted.   Cardiovascular: Normal heart rate noted  Respiratory: Normal respiratory effort, no problems with respiration noted  Abdomen: Soft, gravid, appropriate for gestational age.  Pain/Pressure: Present     Pelvic: Cervical exam deferred        Extremities: Normal range of motion.  Edema: None  Mental Status: Normal mood and affect. Normal behavior. Normal judgment and thought content.   Assessment and Plan:  Pregnancy: G2P1001 at [redacted]w[redacted]d  1. Encounter for supervision of normal first pregnancy in first trimester  Doing well!  2. Recurrent UTI  She was seen in the MAU recently and  was given fosfomycin. Culture is pending. UA was positive for nitrites. She has some pelvic pain/suprapubic  She was also treated for a yeast infection. She reports this is 3/4 UTI this pregnancy.  Rx: Keflex to remain on for remainder of pregnancy.   3. Inappropriate sinus tachycardia  She has an appointment with Dr. Servando Salina tomorrow. She is considering canceling this.  She is asymptomatic.    Preterm labor symptoms and general obstetric precautions including but not limited to vaginal bleeding, contractions, leaking of fluid and fetal movement were reviewed in detail with the patient. Please refer to After Visit Summary for other counseling recommendations.   No follow-ups on file.  Future Appointments  Date Time Provider Department Center  07/31/2022  9:00 AM Thomasene Ripple, DO [facility] None  07/31/2022  3:30 PM [facility] NURSE [facility]  07/31/2022  3:45 PM [facility] US6 [facility]  08/13/2022  9:30 AM Haeleigh Streiff, Harolyn Rutherford, NP [facility]  08/30/2022  9:15 AM [facility] NURSE [facility]  08/30/2022  9:30 AM [facility] US3 [facility]    Venia Carbon, NP

## 2022-07-31 ENCOUNTER — Ambulatory Visit: Payer: Medicaid Other | Admitting: *Deleted

## 2022-07-31 ENCOUNTER — Ambulatory Visit: Payer: Medicaid Other | Admitting: Cardiology

## 2022-07-31 ENCOUNTER — Ambulatory Visit: Payer: Medicaid Other | Attending: Cardiology

## 2022-07-31 ENCOUNTER — Telehealth: Payer: Self-pay | Admitting: Cardiology

## 2022-07-31 VITALS — BP 112/62 | HR 101

## 2022-07-31 DIAGNOSIS — O99213 Obesity complicating pregnancy, third trimester: Secondary | ICD-10-CM

## 2022-07-31 DIAGNOSIS — O4443 Low lying placenta NOS or without hemorrhage, third trimester: Secondary | ICD-10-CM | POA: Diagnosis not present

## 2022-07-31 DIAGNOSIS — Z3A34 34 weeks gestation of pregnancy: Secondary | ICD-10-CM

## 2022-07-31 DIAGNOSIS — E669 Obesity, unspecified: Secondary | ICD-10-CM | POA: Diagnosis not present

## 2022-07-31 LAB — CULTURE, OB URINE: Culture: >=100000 — AB

## 2022-07-31 NOTE — Telephone Encounter (Signed)
Patient is requesting to switch from Dr. Servando Salina to Dr. Shari Prows if possible.   Please advise when able, Thank you both

## 2022-08-01 ENCOUNTER — Other Ambulatory Visit: Payer: Self-pay | Admitting: Pharmacist

## 2022-08-01 ENCOUNTER — Encounter (HOSPITAL_COMMUNITY): Payer: Self-pay | Admitting: Obstetrics and Gynecology

## 2022-08-01 ENCOUNTER — Telehealth: Payer: Self-pay | Admitting: Family Medicine

## 2022-08-01 ENCOUNTER — Inpatient Hospital Stay (HOSPITAL_COMMUNITY)
Admission: AD | Admit: 2022-08-01 | Discharge: 2022-08-01 | Disposition: A | Discharge status: Home / Self Care | Payer: Medicaid Other | Attending: Obstetrics and Gynecology | Admitting: Obstetrics and Gynecology

## 2022-08-01 DIAGNOSIS — M549 Dorsalgia, unspecified: Secondary | ICD-10-CM | POA: Diagnosis not present

## 2022-08-01 DIAGNOSIS — R109 Unspecified abdominal pain: Secondary | ICD-10-CM

## 2022-08-01 DIAGNOSIS — O26893 Other specified pregnancy related conditions, third trimester: Secondary | ICD-10-CM | POA: Diagnosis present

## 2022-08-01 DIAGNOSIS — Z3A35 35 weeks gestation of pregnancy: Secondary | ICD-10-CM

## 2022-08-01 DIAGNOSIS — O09213 Supervision of pregnancy with history of pre-term labor, third trimester: Secondary | ICD-10-CM | POA: Diagnosis not present

## 2022-08-01 DIAGNOSIS — O09893 Supervision of other high risk pregnancies, third trimester: Secondary | ICD-10-CM

## 2022-08-01 DIAGNOSIS — O99891 Other specified diseases and conditions complicating pregnancy: Secondary | ICD-10-CM | POA: Diagnosis not present

## 2022-08-01 DIAGNOSIS — O2343 Unspecified infection of urinary tract in pregnancy, third trimester: Secondary | ICD-10-CM

## 2022-08-01 LAB — URINALYSIS, ROUTINE W REFLEX MICROSCOPIC
Bilirubin Urine: NEGATIVE
Glucose, UA: >=500 mg/dL — AB
Hgb urine dipstick: NEGATIVE
Ketones, ur: NEGATIVE mg/dL
Leukocytes,Ua: NEGATIVE
Nitrite: NEGATIVE
Protein, ur: NEGATIVE mg/dL
Specific Gravity, Urine: 1.021 (ref 1.005–1.030)
pH: 6 (ref 5.0–8.0)

## 2022-08-01 MED ORDER — CYCLOBENZAPRINE HCL 5 MG PO TABS
10.0000 mg | ORAL_TABLET | Freq: Once | ORAL | Status: AC
  Administered 2022-08-01: 10 mg via ORAL
  Filled 2022-08-01: qty 2

## 2022-08-01 MED ORDER — CEFADROXIL 500 MG PO CAPS: 500.0000 mg | ORAL_CAPSULE | Freq: Two times a day (BID) | ORAL | 0 refills | Status: DC

## 2022-08-01 NOTE — MAU Note (Signed)
...  Kimberly Lewis is a 26 y.o. at [redacted]w[redacted]d here in MAU reporting: She reports she woke up this morning around 0600 and states her pain had intensified. She reports she was diagnosed with a UTI on 11/13 and was prescribed one dose of Fosfomycin. She reports since then her pain has worsened and her pain is now on her left flank area and right lower back. She reports she is still experiencing the lower abdominal cramping. Denies VB or LOF. +FM.   Frequent UTI's in this pregnancy. Has not picked up her newly prescribed Duricef that was ordered today.  Pain score:  9/10 lower abdomen 9/10 left flank 5/10 right lower back - dull    FHT: 132 doppler Lab orders placed from triage:  UA

## 2022-08-01 NOTE — MAU Provider Note (Signed)
History     [id number]  Arrival date and time: 08/01/22 1229    Chief Complaint  Patient presents with   Abdominal Pain   Back Pain   Flank Pain   Recurrent UTI     HPI Kimberly Lewis is a 26 y.o. at [redacted]w[redacted]d by 7 wk [place] with PMHx notable for prior preterm delivery at 37 weeks, who presents for abdominal pain.   Patient seen recently in MAU on 07/29/2022 with similar complaints, treated for yeast and UTI and discharged home  I also spoke with patient earlier today because of her positive urine culture and sent her a new rx for duricef  Patient reports that starting early this morning she has been worsening cramping in her lower abdomen and back Comes and goes No bleeding or leaking fluid Endorses good fetal movement No burning or pain with urination No vaginal discharge No fevers   B/RH(D) POSITIVE/-- (06/05 0948)  OB History     Gravida  2   Para  1   Term  1   Preterm  0   AB  0   Living  1      SAB  0   IAB  0   Ectopic  0   Multiple  0   Live Births  1           Past Medical History:  Diagnosis Date   Inappropriate sinus tachycardia    Tachycardia    was taken off medication due to pregnancy-naldolol   Trichomonas infection 2017    Past Surgical History:  Procedure Laterality Date   WISDOM TOOTH EXTRACTION Bilateral     Family History  Problem Relation Age of Onset   Hypertension Maternal Grandmother    Cancer Maternal Grandfather    Cancer Paternal Grandmother    Diabetes Other    Asthma Neg Hx    Heart disease Neg Hx    Stroke Neg Hx     Social History   Socioeconomic History   Marital status: Single    Spouse name: Not on file   Number of children: Not on file   Years of education: Not on file   Highest education level: Not on file  Occupational History   Not on file  Tobacco Use   Smoking status: Former    Types: Cigarettes    Passive exposure: Past   Smokeless tobacco: Not on file  Vaping Use   Vaping  Use: Never used  Substance and Sexual Activity   Alcohol use: Not Currently   Drug use: Not Currently   Sexual activity: Not Currently  Other Topics Concern   Not on file  Social History Narrative   Not on file   Social Determinants of Health   Financial Resource Strain: Not on file  Food Insecurity: Not on file  Transportation Needs: No Transportation Needs (02/01/2022)   PRAPARE - Administrator, Civil Service (Medical): No    Lack of Transportation (Non-Medical): No  Physical Activity: Not on file  Stress: Not on file  Social Connections: Not on file  Intimate Partner Violence: Not on file    Allergies  Allergen Reactions   Aspirin Nausea And Vomiting    abd pain and vomiting abd pain and vomiting     No current facility-administered medications on file prior to encounter.   Current Outpatient Medications on File Prior to Encounter  Medication Sig Dispense Refill   Prenatal Vit-Fe Fumarate-FA (PRENATAL VITAMIN) 27-0.8 MG TABS Take  1 tablet by mouth daily. 90 tablet 4   cefadroxil (DURICEF) 500 MG capsule Take 1 capsule (500 mg total) by mouth 2 (two) times daily. 14 capsule 0   Probiotic Product (PROBIOTIC DAILY PO) Take by mouth. (Patient not taking: Reported on 07/31/2022)       ROS Pertinent positives and negative per HPI, all others reviewed and negative  Physical Exam   BP 129/68   Pulse 93   Temp 98.6 F (37 C) (Oral)   Resp 19   Ht  (1.549 m)   Wt 108.3 kg   LMP 11/29/2021   SpO2 99%   BMI 45.10 kg/m   Patient Vitals for the past 24 hrs:  BP Temp Temp src Pulse Resp SpO2 Height Weight  08/01/22 1311 129/68 -- -- 93 -- -- -- --  08/01/22 1252 119/63 98.6 F (37 C) Oral (!) 103 19 99 %  (1.549 m) 108.3 kg    Physical Exam Vitals reviewed.  Constitutional:      General: She is not in acute distress.    Appearance: She is well-developed. She is not diaphoretic.  Eyes:     General: No scleral icterus. Pulmonary:      Effort: Pulmonary effort is normal. No respiratory distress.  Abdominal:     General: There is no distension.     Palpations: Abdomen is soft.     Tenderness: There is no abdominal tenderness. There is no guarding or rebound.  Skin:    General: Skin is warm and dry.  Neurological:     Mental Status: She is alert.     Coordination: Coordination normal.      Cervical Exam Dilation: Closed Effacement (%): Thick Exam by:: Dr. Crissie Reese  Bedside Ultrasound Not done  My interpretation: n/a  FHT Baseline 145, moderate variability, +accels, no decels Toco: flat Cat: I  Labs Results for orders placed or performed during the hospital encounter of 08/01/22 (from the past 24 hour(s))  Urinalysis, Routine w reflex microscopic Urine, Clean Catch     Status: Abnormal   Collection Time: 08/01/22  1:14 PM  Result Value Ref Range   Color, Urine YELLOW YELLOW   APPearance HAZY (A) CLEAR   Specific Gravity, Urine 1.021 1.005 - 1.030   pH 6.0 5.0 - 8.0   Glucose, UA >=500 (A) NEGATIVE mg/dL   Hgb urine dipstick NEGATIVE NEGATIVE   Bilirubin Urine NEGATIVE NEGATIVE   Ketones, ur NEGATIVE NEGATIVE mg/dL   Protein, ur NEGATIVE NEGATIVE mg/dL   Nitrite NEGATIVE NEGATIVE   Leukocytes,Ua NEGATIVE NEGATIVE   RBC / HPF 0-5 0 - 5 RBC/hpf   WBC, UA 0-5 0 - 5 WBC/hpf   Bacteria, UA FEW (A) NONE SEEN   Squamous Epithelial / LPF 11-20 0 - 5   Mucus PRESENT     Imaging [place] MFM OB FOLLOW UP  Result Date: 07/31/2022 ----------------------------------------------------------------------  OBSTETRICS REPORT                       (Signed Final 07/31/2022 04:37 pm) ---------------------------------------------------------------------- Patient Info  ID #:       [id number]                          D.O.B.:  05-27-1996 (26 yrs)  Name:       Kimberly Lewis                Visit Date: 07/31/2022 04:14 pm ----------------------------------------------------------------------  Performed By  Attending:        Braxton Feathers DO       Ref. Address:     [address], [place]  Performed By:     Cyndie Mull          [facility]  Referred By:      Everardo All ---------------------------------------------------------------------- Orders  #  Description                           Code        Ordered By  1  [place] MFM OB FOLLOW UP                   35597.41    Braxton Feathers ----------------------------------------------------------------------  #  Order #                     Accession #                Episode #  1  [id number]                   [id number]                 [id number] ---------------------------------------------------------------------- Indications  Obesity complicating pregnancy, second         O99.212  trimester (BMI 35.92)  Low lying placenta, antepartum (resolved)      O44.40  NIPS LR female, Horizon neg, AFP neg  [redacted] weeks gestation of pregnancy                Z3A.34 ---------------------------------------------------------------------- Vital Signs  BP:          112/62 ---------------------------------------------------------------------- Fetal Evaluation  Num Of Fetuses:         1  Fetal Heart Rate(bpm):  175  Cardiac Activity:       Observed  Presentation:           Cephalic  Placenta:  Posterior  P. Cord Insertion:      Previously Visualized  Amniotic Fluid  AFI FV:      Within normal limits  AFI Sum(cm)     %Tile       Largest Pocket(cm)  20.2            76          7.2  RUQ(cm)       RLQ(cm)       LUQ(cm)        LLQ(cm)  4.1           4.3           4.6            7.2 ---------------------------------------------------------------------- Biometry  BPD:        87  mm     G. Age:  35w 1d         73   %    CI:        75.59   %    70 - 86                                                          FL/HC:      20.5   %    19.4 - 21.8  HC:      317.3  mm     G. Age:  35w 5d         50  %    HC/AC:      0.95        0.96 - 1.11  AC:      332.6  mm     G. Age:  37w 1d         99  %    FL/BPD:     74.9   %    71 - 87  FL:       65.2  mm     G. Age:  33w 4d         24  %    FL/AC:      19.6   %    20 - 24  Est. FW:    2798  gm      6 lb 3 oz     88  % ---------------------------------------------------------------------- OB History  Gravidity:    2         Term:   1 ---------------------------------------------------------------------- Gestational Age  LMP:           34w 6d        Date:  11/29/21                  EDD:   09/05/22  U/S Today:     35w 3d                                        EDD:   09/01/22  Best:          34w 2d     Det. By:  Previous Ultrasound      EDD:   09/09/22                                      (  01/24/22) ---------------------------------------------------------------------- Anatomy  Cranium:               Appears normal         LVOT:                   Previously seen  Cavum:                 Appears normal         Aortic Arch:            Previously seen  Ventricles:            Previously seen        Ductal Arch:            Previously seen  Choroid Plexus:        Previously seen        Diaphragm:              Appears normal  Cerebellum:            Previously seen        Stomach:                Appears normal, left                                                                        sided  Posterior Fossa:       Previously seen        Abdomen:                Previously seen  Nuchal Fold:           Previously seen        Abdominal Wall:         Previously seen  Face:                  Orbits and profile     Cord Vessels:           Previously seen                         previously seen  Lips:                  Previously seen        Kidneys:                Appear normal  Palate:                Not well  visualized    Bladder:                Appears normal  Thoracic:              Appears normal         Spine:                  Previously seen  Heart:                 Appears normal         Upper Extremities:      Previously seen                         (  4CH, axis, and                         situs)  RVOT:                  Previously seen        Lower Extremities:      Previously seen  Other:  Female gender previously seen. Heels/feet, open hands/5th digits,          Nasal bone, lenses, maxilla, mandible and falx visualized previously.          Technicallly difficult due to advanced GA and maternal habitus. ---------------------------------------------------------------------- Cervix Uterus Adnexa  Adnexa  No abnormality visualized. ---------------------------------------------------------------------- Comments  Follow-up growth ultrasound at 34w 2d with EDD of  09/09/2022 dated by Previous Ultrasound  (01/24/22).  Sonographic findings  Single intrauterine pregnancy at 34w 2d.  Observed fetal cardiac activity.  Cephalic presentation.  Interval fetal anatomy appears normal.  Fetal biometry shows the estimated fetal weight at the 88  percentile.  Amniotic fluid volume: Within normal limits. AFI: 20.2 cm.  MVP: 7.2 cm.  Placenta: Posterior.  Recommendations  - F/u growth in 4 weeks for obesity ----------------------------------------------------------------------                   Braxton Feathers, DO Electronically Signed Final Report   07/31/2022 04:37 pm ----------------------------------------------------------------------   MAU Course  Procedures Lab Orders         Urinalysis, Routine w reflex microscopic Urine, Clean Catch    Meds ordered this encounter  Medications   cyclobenzaprine (FLEXERIL) tablet 10 mg   Imaging Orders  No imaging studies ordered today    MDM moderate  Assessment and Plan  #Abdominal pain in pregnancy, third trimester #[redacted] weeks gestation of pregnancy #History of preterm  delivery Cervix closed, toco flat, likely due to advancing gestational age in multip. No urinary symptoms to suggest UTI/pyelo, neg CVA tenderness, and nitrites that were present a few days ago are no longer present. Flexeril given with improvement in pain. Discharged with labor precautions.   #FWB FHT Cat I NST: Reactive   Dispo: discharged to home in stable condition.    Venora Maples, MD/MPH 08/01/22 2:35 PM  Allergies as of 08/01/2022       Reactions   Aspirin Nausea And Vomiting   abd pain and vomiting abd pain and vomiting        Medication List     STOP taking these medications    cefadroxil 500 MG capsule Commonly known as: DURICEF       TAKE these medications    Prenatal Vitamin 27-0.8 MG Tabs Take 1 tablet by mouth daily.   PROBIOTIC DAILY PO Take by mouth.

## 2022-08-01 NOTE — Telephone Encounter (Signed)
Called patient as she was treated for UTI in MAU with fosfomycin, per discussion with pharmacist likely inadequate coverage.  She reports ongoing symptoms and has started taking keflex prophylaxis. Given sensitivities I think it unlikely this will be working. Sent rx for Duricef, can follow up at next OB visit and discuss alternative suppressive therapy.   FYI to provider.

## 2022-08-12 NOTE — Telephone Encounter (Signed)
Patient followed up requesting an appointment prior to 12/21 expected delivery date. Patient agrees to seeing Dr. Servando Salina for sooner appointment as Dr. Shari Prows does not have any sooner availability at this time. Advised patient that I will check with the doctor to make sure this is alright now that her provider switch has been approved.

## 2022-08-13 ENCOUNTER — Other Ambulatory Visit (HOSPITAL_COMMUNITY)
Admission: RE | Admit: 2022-08-13 | Discharge: 2022-08-13 | Disposition: A | Discharge status: Home / Self Care | Payer: Medicaid Other | Source: Ambulatory Visit | Attending: Obstetrics and Gynecology | Admitting: Obstetrics and Gynecology

## 2022-08-13 ENCOUNTER — Ambulatory Visit (INDEPENDENT_AMBULATORY_CARE_PROVIDER_SITE_OTHER): Payer: Medicaid Other | Admitting: Obstetrics and Gynecology

## 2022-08-13 VITALS — BP 116/76 | HR 94 | Wt 246.0 lb

## 2022-08-13 DIAGNOSIS — Z3403 Encounter for supervision of normal first pregnancy, third trimester: Secondary | ICD-10-CM | POA: Diagnosis not present

## 2022-08-13 DIAGNOSIS — N390 Urinary tract infection, site not specified: Secondary | ICD-10-CM

## 2022-08-13 DIAGNOSIS — Z3401 Encounter for supervision of normal first pregnancy, first trimester: Secondary | ICD-10-CM | POA: Diagnosis not present

## 2022-08-13 DIAGNOSIS — Z3A36 36 weeks gestation of pregnancy: Secondary | ICD-10-CM

## 2022-08-13 NOTE — Progress Notes (Signed)
   PRENATAL VISIT NOTE  Subjective:  Kimberly Lewis is a 26 y.o. G2P1001 at [redacted]w[redacted]d being seen today for ongoing prenatal care.  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  Patient reports no complaints.  Contractions: Irritability. Vag. Bleeding: None.  Movement: Present. Denies leaking of fluid.   The following portions of the patient's history were reviewed and updated as appropriate: allergies, current medications, past family history, past medical history, past social history, past surgical history and problem list.   Objective:   Vitals:   08/13/22 0937  BP: 116/76  Pulse: 94  Weight: 246 lb (111.6 kg)    Fetal Status: Fetal Heart Rate (bpm): 150 Fundal Height: 38 cm Movement: Present     General:  Alert, oriented and cooperative. Patient is in no acute distress.  Skin: Skin is warm and dry. No rash noted.   Cardiovascular: Normal heart rate noted  Respiratory: Normal respiratory effort, no problems with respiration noted  Abdomen: Soft, gravid, appropriate for gestational age.  Pain/Pressure: Present     Pelvic: Cervical exam deferred        Extremities: Normal range of motion.  Edema: None  Mental Status: Normal mood and affect. Normal behavior. Normal judgment and thought content.   Assessment and Plan:  Pregnancy: G2P1001 at [redacted]w[redacted]d 1. Encounter for supervision of normal first pregnancy in first trimester  - Cervicovaginal ancillary  only( [place]) - Culture, beta strep (group b only)  2. Recurrent UTI  Urine culture shows resistance to Cefazolin and macrobid. Per Dr. Crissie Reese there is no suppression medication she can take. She completed 1 dose of Fosfomycin.  She is asymptomatic Plan for TOC/urine in Dec.   Preterm labor symptoms and general obstetric precautions including but not limited to vaginal bleeding, contractions, leaking of fluid and fetal movement were reviewed in detail with the patient. Please refer to After Visit Summary for other counseling recommendations.   No follow-ups on file.  Future Appointments  Date Time Provider Department Center  08/15/2022  3:00 PM Thomasene Ripple, DO [facility] None  08/30/2022  9:15 AM [facility] NURSE [facility]  08/30/2022  9:30 AM [facility] US3 [facility]    Venia Carbon, NP

## 2022-08-14 LAB — CERVICOVAGINAL ANCILLARY ONLY
Chlamydia: NEGATIVE
Comment: NEGATIVE
Comment: NORMAL
Neisseria Gonorrhea: NEGATIVE

## 2022-08-14 NOTE — Telephone Encounter (Signed)
Patient has an appointment 08/15/22.

## 2022-08-15 ENCOUNTER — Ambulatory Visit: Payer: Medicaid Other | Admitting: Cardiology

## 2022-08-16 LAB — CULTURE, BETA STREP (GROUP B ONLY)
MICRO NUMBER:: 14244950
SPECIMEN QUALITY:: ADEQUATE

## 2022-08-20 ENCOUNTER — Ambulatory Visit (INDEPENDENT_AMBULATORY_CARE_PROVIDER_SITE_OTHER): Payer: Medicaid Other | Admitting: Certified Nurse Midwife

## 2022-08-20 VITALS — BP 129/82 | HR 83 | Wt 251.0 lb

## 2022-08-20 DIAGNOSIS — Z3A37 37 weeks gestation of pregnancy: Secondary | ICD-10-CM

## 2022-08-20 DIAGNOSIS — O99213 Obesity complicating pregnancy, third trimester: Secondary | ICD-10-CM

## 2022-08-20 DIAGNOSIS — O2603 Excessive weight gain in pregnancy, third trimester: Secondary | ICD-10-CM

## 2022-08-20 DIAGNOSIS — O99210 Obesity complicating pregnancy, unspecified trimester: Secondary | ICD-10-CM | POA: Insufficient documentation

## 2022-08-20 DIAGNOSIS — O2342 Unspecified infection of urinary tract in pregnancy, second trimester: Secondary | ICD-10-CM

## 2022-08-20 DIAGNOSIS — O2600 Excessive weight gain in pregnancy, unspecified trimester: Secondary | ICD-10-CM | POA: Insufficient documentation

## 2022-08-20 DIAGNOSIS — O2343 Unspecified infection of urinary tract in pregnancy, third trimester: Secondary | ICD-10-CM

## 2022-08-20 NOTE — Progress Notes (Signed)
Subjective:  Kimberly Lewis is a 26 y.o. G2P1001 at [redacted]w[redacted]d being seen today for ongoing prenatal care.  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  Patient reports no complaints.  Contractions: Irritability. Vag. Bleeding: None.  Movement: Present. Denies leaking of fluid.   The following portions of the patient's history were reviewed and updated as appropriate: allergies, current medications, past family history, past medical history, past social history, past surgical history and problem list. Problem list updated.  Objective:   Vitals:   08/20/22 1054  BP: 129/82  Pulse: 83  Weight: 251 lb (113.9 kg)    Fetal Status: Fetal Heart Rate (bpm): 156 Fundal Height: 42 cm Movement: Present  Presentation: Vertex  General:  Alert, oriented and cooperative. Patient is in no acute distress.  Skin: Skin is warm and dry. No rash noted.   Cardiovascular: Normal heart rate noted  Respiratory: Normal respiratory effort, no problems with respiration noted  Abdomen: Soft, gravid, appropriate for gestational age. Pain/Pressure: Present     Pelvic: Vag. Bleeding: None Vag D/C Character: Thin   Cervical exam performed Dilation: 3 Effacement (%): 70 Station: -2  Extremities: Normal range of motion.  Edema: Trace  Mental Status: Normal mood and affect. Normal behavior. Normal judgment and thought content.   Urinalysis:       Assessment and Plan:  Pregnancy: G2P1001 at [redacted]w[redacted]d  1. Obesity affecting pregnancy in third trimester, unspecified obesity type - 88%ile at last [place] - follow up scheduled  2. Excessive weight gain during pregnancy in third trimester - TWG 61 lbs  3. [redacted] weeks gestation of pregnancy  4. Urinary tract infection in mother during third trimester of pregnancy - TOC - Culture, OB Urine  Term labor symptoms and general obstetric precautions including but not limited to vaginal bleeding, contractions, leaking of fluid and fetal movement were reviewed in detail with the patient. Please refer to After Visit Summary for other counseling recommendations.  Return in about 1 week (around 08/27/2022).   Donette Larry, CNM

## 2022-08-21 ENCOUNTER — Telehealth: Payer: Self-pay

## 2022-08-21 ENCOUNTER — Other Ambulatory Visit (INDEPENDENT_AMBULATORY_CARE_PROVIDER_SITE_OTHER): Payer: Medicaid Other

## 2022-08-21 VITALS — BP 127/81 | HR 85

## 2022-08-21 DIAGNOSIS — O2603 Excessive weight gain in pregnancy, third trimester: Secondary | ICD-10-CM

## 2022-08-21 DIAGNOSIS — O99213 Obesity complicating pregnancy, third trimester: Secondary | ICD-10-CM

## 2022-08-21 DIAGNOSIS — Z3480 Encounter for supervision of other normal pregnancy, unspecified trimester: Secondary | ICD-10-CM

## 2022-08-21 NOTE — Telephone Encounter (Signed)
Pt called back with BP reading of 138/93. Per Dr.Duncan pt should come today for nurse visit BP check and labs. Pt scheduled for 1:30. Dr.Duncan would also like pt to come Friday for BP check. Pt scheduled for 9:30am.

## 2022-08-21 NOTE — Telephone Encounter (Signed)
Returning pt call. Pt called stating BP was 140/88. Pt denies headache. Pt states she was "seeing spots" early this morning but that has gone away. Pt endorses fetal movement. Pt was told to rest and repeat BP in about an hour and send BP reading through MyChart. Pt expressed understanding.

## 2022-08-21 NOTE — Progress Notes (Signed)
Pt here for BP check and labs per Dr.Duncan. BP is 127/ 81. Pt denies headache and/or visual changes at this time. Pt did have visual changes early this morning but that has since gone away and Dr.Duncan was aware of that.  Pt given lab orders and sent to lab. Pt was told if BP increases or she develops headache/visual changes to go to MAU.

## 2022-08-22 ENCOUNTER — Inpatient Hospital Stay (HOSPITAL_COMMUNITY)
Admission: AD | Admit: 2022-08-22 | Discharge: 2022-08-22 | Disposition: A | Discharge status: Home / Self Care | Payer: Medicaid Other | Attending: Obstetrics & Gynecology | Admitting: Obstetrics & Gynecology

## 2022-08-22 ENCOUNTER — Encounter: Payer: Self-pay | Admitting: Cardiology

## 2022-08-22 ENCOUNTER — Encounter (HOSPITAL_COMMUNITY): Payer: Self-pay | Admitting: Obstetrics & Gynecology

## 2022-08-22 DIAGNOSIS — O479 False labor, unspecified: Secondary | ICD-10-CM

## 2022-08-22 DIAGNOSIS — Z3A38 38 weeks gestation of pregnancy: Secondary | ICD-10-CM | POA: Insufficient documentation

## 2022-08-22 DIAGNOSIS — O471 False labor at or after 37 completed weeks of gestation: Secondary | ICD-10-CM | POA: Insufficient documentation

## 2022-08-22 LAB — CBC
HCT: 38.6 % (ref 35.0–45.0)
Hemoglobin: 13.5 g/dL (ref 11.7–15.5)
MCH: 28.8 pg (ref 27.0–33.0)
MCHC: 35 g/dL (ref 32.0–36.0)
MCV: 82.5 fL (ref 80.0–100.0)
MPV: 10.9 fL (ref 7.5–12.5)
Platelets: 249 10*3/uL (ref 140–400)
RBC: 4.68 10*6/uL (ref 3.80–5.10)
RDW: 14.3 % (ref 11.0–15.0)
WBC: 8.7 10*3/uL (ref 3.8–10.8)

## 2022-08-22 LAB — COMPREHENSIVE METABOLIC PANEL
AG Ratio: 1.2 (calc) (ref 1.0–2.5)
ALT: 10 U/L (ref 6–29)
AST: 10 U/L (ref 10–30)
Albumin: 3.5 g/dL — ABNORMAL LOW (ref 3.6–5.1)
Alkaline phosphatase (APISO): 121 U/L (ref 31–125)
BUN: 13 mg/dL (ref 7–25)
CO2: 23 mmol/L (ref 20–32)
Calcium: 9.2 mg/dL (ref 8.6–10.2)
Chloride: 104 mmol/L (ref 98–110)
Creat: 0.79 mg/dL (ref 0.50–0.96)
Globulin: 3 g/dL (calc) (ref 1.9–3.7)
Glucose, Bld: 78 mg/dL (ref 65–139)
Potassium: 4.1 mmol/L (ref 3.5–5.3)
Sodium: 135 mmol/L (ref 135–146)
Total Bilirubin: 0.4 mg/dL (ref 0.2–1.2)
Total Protein: 6.5 g/dL (ref 6.1–8.1)

## 2022-08-22 LAB — PROTEIN / CREATININE RATIO, URINE
Creatinine, Urine: 85 mg/dL (ref 20–275)
Protein/Creat Ratio: 294 mg/g creat — ABNORMAL HIGH (ref 24–184)
Protein/Creatinine Ratio: 0.294 mg/mg creat — ABNORMAL HIGH (ref 0.024–0.184)
Total Protein, Urine: 25 mg/dL — ABNORMAL HIGH (ref 5–24)

## 2022-08-22 NOTE — MAU Note (Signed)
.  Kimberly Lewis is a 26 y.o. at [redacted]w[redacted]d here in MAU reporting: ctx have been on-going but became more intense around 0300 this morning. She rates the ctx 8/10. Denies VB or LOF. Reports good FM. She reports h/o recurrent UTIs during her pregnancy and also reports that her BP was elevated this week so they have been following her labs in the office this week. Denies HA, RUQ/epigastric pain, visual changes, or abnormal swelling.   SVE: last week 3 cm per patient report LMP: N/A Onset of complaint: On-going Pain score: 8/10 Vitals:   08/22/22 0911 08/22/22 0912  BP:  126/70  Pulse: 83 81  Resp: 20   Temp: 98.3 F (36.8 C)   SpO2: 99%      FHT:145 Lab orders placed from triage:  MAU labor

## 2022-08-22 NOTE — MAU Provider Note (Addendum)
S: [name] is a 26 y.o. G2P1001 at [redacted]w[redacted]d  who presents to MAU today for labor evaluation.     Cervical exam by RN:  Dilation: 2 Effacement (%): 20 Station: -3 Exam by:: Kimberly Snell, RN  Fetal Monitoring: Baseline: 145bpm Variability: moderate Accelerations: 15x15 Decelerations: none Contractions: irregular, mild, once every 8-12 minutes  MDM Discussed patient with RN. NST reviewed.   A: SIUP at [redacted]w[redacted]d  False labor  P: Discharge home Labor precautions and kick counts included in AVS Patient to follow-up with Kimberly Carbon, NP on 12/12 as scheduled  Patient may return to MAU as needed or when in labor   Kimberly Lewis., MD 08/22/2022 10:36 AM   Fellow Attestation  I reviewed this patient's chart,  performing the key elements of the service.I  personally performed the medical decision making activities of this service and have verified that the service and findings are accurately documented in the resident's note. I developed the management plan that is described in the resident's note, and I agree with the content, with my edits above.   Kimberly Ferguson, MD, MPH OB Fellow, Faculty Practice  08/22/2022 10:41 AM

## 2022-08-22 NOTE — MAU Note (Signed)
I have communicated with Lesly Dukes, MD and reviewed vital signs:  Vitals:   08/22/22 0917 08/22/22 1026  BP: 128/73 131/60  Pulse: 80 82  Resp:    Temp:    SpO2:  100%    Vaginal exam:  Dilation: 2 Effacement (%): 20 Station: -3 Exam by:: Georgina Snell, RN,   Also reviewed contraction pattern and that non-stress test is reactive.  It has been documented that patient is having irregular contractions with no cervical change over 1 hour not indicating active labor.  Patient denies any other complaints.  Based on this report provider has given order for discharge.  A discharge order and diagnosis entered by a provider.   Labor discharge instructions reviewed with patient. Patient verbalized understanding on when to return to the hospital.

## 2022-08-24 ENCOUNTER — Other Ambulatory Visit: Payer: Self-pay | Admitting: Certified Nurse Midwife

## 2022-08-24 DIAGNOSIS — O2343 Unspecified infection of urinary tract in pregnancy, third trimester: Secondary | ICD-10-CM

## 2022-08-24 LAB — CULTURE, OB URINE

## 2022-08-24 LAB — URINE CULTURE, OB REFLEX

## 2022-08-24 MED ORDER — CEFDINIR 300 MG PO CAPS: 300.0000 mg | ORAL_CAPSULE | Freq: Two times a day (BID) | ORAL | 0 refills | Status: DC

## 2022-08-26 ENCOUNTER — Other Ambulatory Visit: Payer: Self-pay

## 2022-08-26 ENCOUNTER — Inpatient Hospital Stay (HOSPITAL_COMMUNITY): Payer: Medicaid Other | Admitting: Anesthesiology

## 2022-08-26 ENCOUNTER — Inpatient Hospital Stay (HOSPITAL_COMMUNITY)
Admission: AD | Admit: 2022-08-26 | Discharge: 2022-08-29 | DRG: 807 | Disposition: A | Discharge status: Home / Self Care | Payer: Medicaid Other | Attending: Obstetrics and Gynecology | Admitting: Obstetrics and Gynecology

## 2022-08-26 ENCOUNTER — Telehealth: Payer: Self-pay | Admitting: *Deleted

## 2022-08-26 ENCOUNTER — Encounter (HOSPITAL_COMMUNITY): Payer: Self-pay | Admitting: Family Medicine

## 2022-08-26 DIAGNOSIS — R200 Anesthesia of skin: Secondary | ICD-10-CM | POA: Diagnosis not present

## 2022-08-26 DIAGNOSIS — O99213 Obesity complicating pregnancy, third trimester: Secondary | ICD-10-CM

## 2022-08-26 DIAGNOSIS — Z87891 Personal history of nicotine dependence: Secondary | ICD-10-CM

## 2022-08-26 DIAGNOSIS — O4202 Full-term premature rupture of membranes, onset of labor within 24 hours of rupture: Secondary | ICD-10-CM | POA: Diagnosis not present

## 2022-08-26 DIAGNOSIS — N390 Urinary tract infection, site not specified: Secondary | ICD-10-CM | POA: Diagnosis present

## 2022-08-26 DIAGNOSIS — O2603 Excessive weight gain in pregnancy, third trimester: Secondary | ICD-10-CM

## 2022-08-26 DIAGNOSIS — O99214 Obesity complicating childbirth: Secondary | ICD-10-CM | POA: Diagnosis present

## 2022-08-26 DIAGNOSIS — O26893 Other specified pregnancy related conditions, third trimester: Secondary | ICD-10-CM | POA: Diagnosis present

## 2022-08-26 DIAGNOSIS — O479 False labor, unspecified: Secondary | ICD-10-CM | POA: Diagnosis present

## 2022-08-26 DIAGNOSIS — Z3A38 38 weeks gestation of pregnancy: Secondary | ICD-10-CM

## 2022-08-26 DIAGNOSIS — O99893 Other specified diseases and conditions complicating puerperium: Secondary | ICD-10-CM | POA: Diagnosis not present

## 2022-08-26 DIAGNOSIS — Z880 Allergy status to penicillin: Secondary | ICD-10-CM

## 2022-08-26 DIAGNOSIS — Z87440 Personal history of urinary (tract) infections: Secondary | ICD-10-CM

## 2022-08-26 DIAGNOSIS — Z8679 Personal history of other diseases of the circulatory system: Secondary | ICD-10-CM | POA: Diagnosis not present

## 2022-08-26 DIAGNOSIS — O165 Unspecified maternal hypertension, complicating the puerperium: Secondary | ICD-10-CM | POA: Diagnosis present

## 2022-08-26 DIAGNOSIS — Z3480 Encounter for supervision of other normal pregnancy, unspecified trimester: Principal | ICD-10-CM

## 2022-08-26 LAB — TYPE AND SCREEN
ABO/RH(D): B POS
Antibody Screen: NEGATIVE

## 2022-08-26 LAB — CBC
HCT: 39.1 % (ref 36.0–46.0)
Hemoglobin: 13.3 g/dL (ref 12.0–15.0)
MCH: 28.6 pg (ref 26.0–34.0)
MCHC: 34 g/dL (ref 30.0–36.0)
MCV: 84.1 fL (ref 80.0–100.0)
Platelets: 209 10*3/uL (ref 150–400)
RBC: 4.65 MIL/uL (ref 3.87–5.11)
RDW: 14 % (ref 11.5–15.5)
WBC: 9 10*3/uL (ref 4.0–10.5)
nRBC: 0 % (ref 0.0–0.2)

## 2022-08-26 LAB — POCT FERN TEST: POCT Fern Test: POSITIVE

## 2022-08-26 LAB — RPR: RPR Ser Ql: NONREACTIVE

## 2022-08-26 MED ORDER — DIPHENHYDRAMINE HCL 50 MG/ML IJ SOLN: 12.5000 mg | INTRAMUSCULAR | Status: DC | PRN

## 2022-08-26 MED ORDER — SODIUM CHLORIDE 0.9 % IV SOLN
1.0000 g | Freq: Once | INTRAVENOUS | Status: AC
  Administered 2022-08-26: 1 g via INTRAVENOUS
  Filled 2022-08-26: qty 10

## 2022-08-26 MED ORDER — PHENYLEPHRINE 80 MCG/ML (10ML) SYRINGE FOR IV PUSH (FOR BLOOD PRESSURE SUPPORT): 80.0000 ug | PREFILLED_SYRINGE | INTRAVENOUS | Status: DC | PRN

## 2022-08-26 MED ORDER — OXYTOCIN-SODIUM CHLORIDE 30-0.9 UT/500ML-% IV SOLN
2.5000 [IU]/h | INTRAVENOUS | Status: DC
  Filled 2022-08-26: qty 500

## 2022-08-26 MED ORDER — FENTANYL CITRATE (PF) 100 MCG/2ML IJ SOLN
50.0000 ug | INTRAMUSCULAR | Status: DC | PRN
  Administered 2022-08-26: 100 ug via INTRAVENOUS
  Filled 2022-08-26: qty 2

## 2022-08-26 MED ORDER — OXYTOCIN BOLUS FROM INFUSION
333.0000 mL | Freq: Once | INTRAVENOUS | Status: AC
  Administered 2022-08-26: 333 mL via INTRAVENOUS

## 2022-08-26 MED ORDER — SODIUM CHLORIDE 0.9 % IV SOLN: Freq: Once | INTRAVENOUS | Status: AC

## 2022-08-26 MED ORDER — LIDOCAINE HCL (PF) 1 % IJ SOLN
INTRAMUSCULAR | Status: DC | PRN
  Administered 2022-08-26: 6 mL via EPIDURAL
  Administered 2022-08-26: 4 mL via EPIDURAL

## 2022-08-26 MED ORDER — LACTATED RINGERS IV SOLN: INTRAVENOUS | Status: DC

## 2022-08-26 MED ORDER — FENTANYL-BUPIVACAINE-NACL 0.5-0.125-0.9 MG/250ML-% EP SOLN
12.0000 mL/h | EPIDURAL | Status: DC | PRN
  Administered 2022-08-26: 12 mL/h via EPIDURAL
  Filled 2022-08-26: qty 250

## 2022-08-26 MED ORDER — LACTATED RINGERS IV SOLN: 500.0000 mL | Freq: Once | INTRAVENOUS | Status: DC

## 2022-08-26 MED ORDER — EPHEDRINE 5 MG/ML INJ: 10.0000 mg | INTRAVENOUS | Status: DC | PRN

## 2022-08-26 MED ORDER — OXYCODONE-ACETAMINOPHEN 5-325 MG PO TABS: 1.0000 | ORAL_TABLET | ORAL | Status: DC | PRN

## 2022-08-26 MED ORDER — LACTATED RINGERS IV SOLN: 500.0000 mL | INTRAVENOUS | Status: DC | PRN

## 2022-08-26 MED ORDER — SOD CITRATE-CITRIC ACID 500-334 MG/5ML PO SOLN: 30.0000 mL | ORAL | Status: DC | PRN

## 2022-08-26 MED ORDER — ACETAMINOPHEN 325 MG PO TABS: 650.0000 mg | ORAL_TABLET | ORAL | Status: DC | PRN

## 2022-08-26 MED ORDER — LIDOCAINE HCL (PF) 1 % IJ SOLN: 30.0000 mL | INTRAMUSCULAR | Status: DC | PRN

## 2022-08-26 MED ORDER — OXYCODONE-ACETAMINOPHEN 5-325 MG PO TABS: 2.0000 | ORAL_TABLET | ORAL | Status: DC | PRN

## 2022-08-26 MED ORDER — ONDANSETRON HCL 4 MG/2ML IJ SOLN: 4.0000 mg | Freq: Four times a day (QID) | INTRAMUSCULAR | Status: DC | PRN

## 2022-08-26 NOTE — Discharge Summary (Addendum)
Postpartum Discharge Summary  Date of Service updated     Patient Name: Kimberly Lewis DOB: 07-09-1996 MRN: [id number]  Date of admission: 08/26/2022 Delivery date:08/26/2022  Delivering provider: Christin Fudge  Date of discharge: 08/29/2022  Admitting diagnosis: Uterine contractions [O47.9] Intrauterine pregnancy: [redacted]w[redacted]d    Secondary diagnosis:  Principal Problem:   Uterine contractions Active Problems:   Recurrent UTI   Vaginal delivery   Postpartum hypertension  Additional problems: None    Discharge diagnosis: Term Pregnancy Delivered                                              Post partum procedures: none Augmentation: none Complications: mild shoulder dystocia, release w/Mc[place]ts and a [facility]: Onset of Labor With Vaginal Delivery      26y.o. yo G2P1001 at 340w4das admitted in Active Labor on 08/26/2022. Labor course was complicated by a mild shoulder dystocia   Membrane Rupture Time/Date: 9:00 AM ,08/26/2022   Delivery Method:Vaginal, Spontaneous  Episiotomy: None none Lacerations:  None none Patient had a postpartum course complicated by postpartum hypertension, for which she was started on nifedipine KL 3038maily and a 5 day course of furosemide. She is ambulating, tolerating a regular diet, passing flatus, and urinating well. Patient is discharged home in stable condition on 08/29/22.  Newborn Data: Birth date:08/26/2022  Birth time:10:01 PM  Gender:Female  Living status:Living  Apgars:2 ,6  Weight:4000 g   Magnesium Sulfate received: No BMZ received: No Rhophylac:N/A MMR:N/A T-DaP:Given prenatally Flu: N/A Transfusion:No  Physical exam  Vitals:   08/28/22 0517 08/28/22 1052 08/28/22 1900 08/29/22 0342  BP: 127/83 121/71 (!) 125/59 125/69  Pulse: 71  82 79  Resp: _0 Temp: 98.2 F (36.8 C)  98.3 F (36.8 C) 98.5 F (36.9 C)  TempSrc: Oral  Oral Oral  SpO2:   100%   Weight:       Height:       General: alert, cooperative, and no distress Lochia: appropriate Uterine Fundus: firm Incision: N/A DVT Evaluation: No evidence of DVT seen on physical exam. No cords or calf tenderness. No significant calf/ankle edema. Labs: Lab Results  Component Value Date   WBC 14.0 (H) 08/27/2022   HGB 12.0 08/27/2022   HCT 35.8 (L) 08/27/2022   MCV 85.9 08/27/2022   PLT 187 08/27/2022      Latest Ref Rng & Units 08/27/2022    2:27 PM  CMP  Glucose 70 - 99 mg/dL 112   BUN 6 - 20 mg/dL 10   Creatinine 0.44 - 1.00 mg/dL 1.04   Sodium 135 - 145 mmol/L 137   Potassium 3.5 - 5.1 mmol/L 3.7   Chloride 98 - 111 mmol/L 106   CO2 22 - 32 mmol/L 22   Calcium 8.9 - 10.3 mg/dL 8.6   Total Protein 6.5 - 8.1 g/dL 5.7   Total Bilirubin 0.3 - 1.2 mg/dL 0.3   Alkaline Phos 38 - 126 U/L 90   AST 15 - 41 U/L 20   ALT 0 - 44 U/L 11    Edinburgh Score:     No data to display           After visit meds:  Allergies as of 08/29/2022       Reactions   Aspirin Nausea And Vomiting  abd pain and vomiting abd pain and vomiting   Amoxicillin Itching, Swelling   Per patient, caused vaginal itching and swelling        Medication List     STOP taking these medications    cefdinir 300 MG capsule Commonly known as: OMNICEF       TAKE these medications    acetaminophen 325 MG tablet Commonly known as: Tylenol Take 2 tablets (650 mg total) by mouth every 4 (four) hours as needed (for pain scale < 4).   benzocaine-Menthol 20-0.5 % Aero Commonly known as: DERMOPLAST Apply 1 Application topically as needed for irritation (perineal discomfort).   coconut oil Oil Apply 1 Application topically as needed.   ferrous sulfate 325 (65 FE) MG tablet Take 1 tablet (325 mg total) by mouth every other day.   furosemide 20 MG tablet Commonly known as: LASIX Take 1 tablet (20 mg total) by mouth daily for 3 days.   ibuprofen 600 MG tablet Commonly known as: ADVIL Take 1  tablet (600 mg total) by mouth every 6 (six) hours.   NIFEdipine 30 MG 24 hr tablet Commonly known as: ADALAT CC Take 1 tablet (30 mg total) by mouth daily.   Prenatal Vitamin 27-0.8 MG Tabs Take 1 tablet by mouth daily.   senna-docusate 8.6-50 MG tablet Commonly known as: Senokot-S Take 2 tablets by mouth daily.   sulfamethoxazole-trimethoprim 800-160 MG tablet Commonly known as: BACTRIM DS Take 1 tablet by mouth every 12 (twelve) hours for 7 doses.         Discharge home in stable condition Infant Feeding: Breast Infant Disposition: NICU due to respiratory distress Discharge instruction: per After Visit Summary and Postpartum booklet. Activity: Advance as tolerated. Pelvic rest for 6 weeks.  Diet: routine diet Future Appointments: Future Appointments  Date Time Provider [place]  09/03/2022 10:50 AM Renee Harder, CNM [facility]  09/06/2022  9:40 AM Tobb, Godfrey Pick, DO [facility] None  09/25/2022 10:50 AM Constant, Vickii Chafe, MD [facility]   Follow up Visit:   Please schedule this patient for a In person postpartum visit in 4 weeks with the following provider: Any provider. Additional Postpartum F/U:  Low risk pregnancy complicated by:  Delivery mode:  Vaginal, Spontaneous  Anticipated Birth Control:   declines   08/29/2022 Jiyun N. Radene Knee, MD   Fellow Attestation  I saw and evaluated the patient, performing the key elements of the service.I  personally performed or re-performed the history, physical exam, and medical decision making activities of this service and have verified that the service and findings are accurately documented in the resident's note. I developed the management plan that is described in the resident's note, and I agree with the content, with my edits above.   Stormy Card, MD,[facility] OB Fellow, Faculty Practice  08/29/2022 12:08 PM

## 2022-08-26 NOTE — MAU Note (Signed)
...  Kimberly Lewis is a 26 y.o. at [redacted]w[redacted]d here in MAU reporting: Water broke at 0900 this morning. Clear fluids, no odors. She reports her CTX started around 0920 and she reports they have become more intense since then and rates them a 7/10. She is unsure how far apart they are. Denies VB. +FM.  Recurrent UTI's in pregnancy. Has not picked up [facility] prescription that was prescribed on the 9th.   Onset of complaint: 0900 this morning Pain score: 7/10 lower abdomen  FHT: 135 initial external Lab orders placed from triage: MAU Labor Eval

## 2022-08-26 NOTE — Telephone Encounter (Signed)
Patient called to tell the office that her water broke. Patient was advised to go to the hospital and approved by clinical staff Mervyn Gay, RN and Deanna, CMA.

## 2022-08-26 NOTE — Progress Notes (Signed)
Labor Progress Note Kimberly Lewis is a 26 y.o. G2P1001 at [redacted]w[redacted]d presented for SROM. S: Patient is pushing with contractions.  O:  BP (!) 158/70   Pulse 90   Temp 98.2 F (36.8 C) (Oral)   Resp 14   Ht 5\' 1"  (1.549 m)   Wt 116 kg   LMP 11/29/2021   SpO2 100%   BMI 48.31 kg/m   CVE: Dilation: 10 Dilation Complete Date: 08/26/22 Dilation Complete Time: 1912 Effacement (%): 100 Cervical Position: Middle Station: Plus 1 Presentation: Vertex Exam by:: Dr. [email or web address]  A&P: 27 y.o. G2P1001 [redacted]w[redacted]d here for SOL. #Labor: Complete and pushing. Making progress. #Pain: Epidural #FWB: Cat 1  [redacted]w[redacted]d, MD Center for [facility], [facility] 9:37 PM

## 2022-08-26 NOTE — Progress Notes (Signed)
Labor Progress Note Devlin Mcveigh is a 25 y.o. G2P1001 at [redacted]w[redacted]d presented for SROM and contractions S: Patient is resting - have some pain in her lower back. She notes some back pain from before, but this is more significant than normal.   O:  BP 135/78   Pulse 86   Temp 98.2 F (36.8 C) (Oral)   Resp 14   Ht 5\' 1"  (1.549 m)   Wt 116 kg   LMP 11/29/2021   SpO2 100%   BMI 48.31 kg/m  EFM: 150 baseline/moderate variability/+Accels, intermittent late decels  CVE: Dilation: 10 Dilation Complete Date: 08/26/22 Dilation Complete Time: 1912 Effacement (%): 100 Cervical Position: Middle Station: Plus 1 Presentation: Vertex Exam by:: Dr. [email or web address]   A&P: 26 y.o. G2P1001 [redacted]w[redacted]d presents with SROM. #Labor: Now complete - starting to push.  #Pain: epidural in place #FWB: cat I reassuring #GBS negative  #Low lying placenta- posterior, resolved.  Yoel Kaufhold N. [redacted]w[redacted]d, MD Center for [facility], [facility] 7:22 PM

## 2022-08-26 NOTE — Progress Notes (Signed)
Labor Progress Note Charlese Gruetzmacher is a 26 y.o. G2P1001 at [redacted]w[redacted]d presented for SROM and contractions S: Patient is resting comfortably. Just got epidural, RN Clara at bedside preparing to place foley.  O:  BP 127/79   Pulse 67   Temp 97.6 F (36.4 C) (Axillary)   Resp 16   Ht 5\' 1"  (1.549 m)   Wt 116 kg   LMP 11/29/2021   SpO2 100%   BMI 48.31 kg/m  EFM: 135 baseline/moderate variability/+Accels, no decels  CVE: Dilation: 3.5 Effacement (%): 90 Cervical Position: Middle Station: -2 Presentation: Vertex Exam by:: Dr. [email or web address]   A&P: 26 y.o. G2P1001 [redacted]w[redacted]d presents with SROM. #Labor: Progressing well. SROM 0900. Contracting regularly on her own, just got epidural. Continue expectant management. #Pain: epidural #FWB: cat I reassuring #GBS negative  #Low lying placenta- posterior, resolved.  [redacted]w[redacted]d, MD Center for [facility], [facility] 3:22 PM

## 2022-08-26 NOTE — Anesthesia Preprocedure Evaluation (Signed)
Anesthesia Evaluation  Patient identified by MRN, date of birth, ID band Patient awake    Reviewed: Allergy & Precautions, H&P , NPO status , Patient's Chart, lab work & pertinent test results  History of Anesthesia Complications Negative for: history of anesthetic complications  Airway Mallampati: II  TM Distance: >3 FB     Dental   Pulmonary neg pulmonary ROS, former smoker   Pulmonary exam normal        Cardiovascular + dysrhythmias (inappropriate sinus tach)  Rhythm:regular Rate:Normal     Neuro/Psych negative neurological ROS  negative psych ROS   GI/Hepatic negative GI ROS, Neg liver ROS,,,  Endo/Other    Morbid obesityPCOS  Renal/GU      Musculoskeletal   Abdominal   Peds  Hematology negative hematology ROS (+)   Anesthesia Other Findings   Reproductive/Obstetrics (+) Pregnancy                              Anesthesia Physical Anesthesia Plan  ASA: 3  Anesthesia Plan: Epidural   Post-op Pain Management:    Induction:   PONV Risk Score and Plan:   Airway Management Planned:   Additional Equipment:   Intra-op Plan:   Post-operative Plan:   Informed Consent: I have reviewed the patients History and Physical, chart, labs and discussed the procedure including the risks, benefits and alternatives for the proposed anesthesia with the patient or authorized representative who has indicated his/her understanding and acceptance.       Plan Discussed with:   Anesthesia Plan Comments:          Anesthesia Quick Evaluation

## 2022-08-26 NOTE — Anesthesia Procedure Notes (Signed)
Epidural Patient location during procedure: OB Start time: 08/26/2022 2:31 PM End time: 08/26/2022 2:42 PM  Staffing Anesthesiologist: Lucretia Kern, MD Performed: anesthesiologist   Preanesthetic Checklist Completed: patient identified, IV checked, risks and benefits discussed, monitors and equipment checked, pre-op evaluation and timeout performed  Epidural Patient position: sitting Prep: DuraPrep Patient monitoring: heart rate, continuous pulse ox and blood pressure Approach: midline Location: L3-L4 Injection technique: LOR air  Needle:  Needle type: Tuohy  Needle gauge: 17 G Needle length: 9 cm Needle insertion depth: 7 cm Catheter type: closed end flexible Catheter size: 19 Gauge Catheter at skin depth: 12 cm Test dose: negative  Assessment Events: blood not aspirated, no cerebrospinal fluid, injection not painful, no injection resistance, no paresthesia and negative IV test  Additional Notes Reason for block:procedure for pain

## 2022-08-26 NOTE — Consult Note (Signed)
Neonatology Note:  Attendance at [facility]:   Our team responded to a [facility] call to room # 215 following NSVD, due to infant with apnea. The requesting physician was CNM Fran Cresenzo-Dishmon. The mother is a G2P1001, GBS neg with good PNC complicated by recurrent UTIs (had not filled recent abx for infection on 12/9).  No fevers, chorio concerns.  Given dose of Rocephin midday. ROM occurred 13h 01m prior to delivery and the fluid was clear.  At delivery, the baby had a mild shoulder dystocia that was reduced.  When placed on mom, infant without tone, resp effort or pink color.  The OB nursing staff in attendance gave vigorous stimulation and the code was called. Our team arrived at 1-2 minutes of life, at which time the baby was just receiving initiation of CPAP.  Resp effort poor, no tone, HR >100. We began PPV and placed pulse ox. Responded minimal grimace and weak cry/gasps.  Lungs very coarse, equal.  Suctioned nares and mouth, repositioned and continued PPV,  Able to transition to CPAP 6cm .  Very slow gradual improvements in resp effort, though labored and initially irregular, and with prolonged hypotonia with limited grimacing.  Tachycardic to 200s.  Radiant warmer turned down while waiting for cord gas. Adjusted fio2, settled on 60% peep 5cm. Stable for transport.   Ap 2/6/7.  I spoke with the parents in the DR, then transferred the baby to the NICU for continued management.  No issues during transport; infant   Evely Gainey C. Tonji Elliff, MD  

## 2022-08-26 NOTE — H&P (Signed)
OBSTETRIC ADMISSION HISTORY AND PHYSICAL  Kimberly Lewis is a 26 y.o. female G2P1001 with IUP at [redacted]w[redacted]d by LMP presenting for SROM at 0900 and contractions. She reports +FMs, No LOF, no VB, no blurry vision, headaches or peripheral edema, and RUQ pain.  She plans on breast feeding. She requests nothing for birth control. She received her prenatal care at  [facility]    Dating: By LMP --->  Estimated Date of Delivery: 09/05/22  Sono:    @[redacted]w[redacted]d , CWD, normal anatomy, cephalic presentation, 2798g, 10-30-1985 EFW   Prenatal History/Complications:   Patient Active Problem List   Diagnosis Date Noted   Uterine contractions 08/26/2022   Obesity affecting pregnancy 08/20/2022   Excess weight gain in pregnancy 08/20/2022   History of preterm delivery, currently pregnant in third trimester 08/01/2022   Recurrent UTI 07/30/2022   UTI in pregnancy 04/12/2022   Low-lying placenta 04/12/2022   Supervision of other normal pregnancy, antepartum 02/18/2022   Obesity (BMI 35.0-39.9 without comorbidity) 02/14/2022   Inappropriate sinus tachycardia 02/04/2022   Papanicolaou smear of cervix with low grade squamous intraepithelial lesion (LGSIL) 07/05/2020   PCOS (polycystic ovarian syndrome) 07/05/2020   MDD (major depressive disorder), recurrent episode (HCC) 01/11/2018   Posttraumatic stress disorder 12/14/2015         Nursing Staff Provider  Office Location  [place] Dating  LMP  [facility] Model [X]  Traditional [ ]  Centering [ ]  Mom-Baby Dyad      Language  English Anatomy [facility]  Low lying placenta, resolved  Flu Vaccine    Genetic/Carrier Screen  NIPS:   NML FEMALE AFP:   Negative  Horizon:Neg  TDaP Vaccine   07/30/22 Hgb A1C or  GTT   Third trimester Normal 2 hour GTT  COVID Vaccine     LAB RESULTS   Rhogam  n/a Blood Type --/--/B POS (04/27 1243)   Baby Feeding Plan Breast Antibody NO ANTIBODIES DETECTED (06/05 0948)  Contraception Declines Rubella 8.14 (06/05 0948)  Circumcision Boy- Declined  RPR NON-REACTIVE  (06/05 0948)   Pediatrician  [facility]  HBsAg NON-REACTIVE (06/05 0948)   Support Person Fob - Dominquv HCVAb Neg  Prenatal Classes Informed  HIV NON-REACTIVE (06/05 0948)     BTL Consent n/a GBS (For PCN allergy, check sensitivities)   VBAC Consent n/a Pap 6/23           DME Rx [ ]  BP cuff [ ]  Weight Scale Waterbirth  [ ]  Class [ ]  Consent [ ]  CNM visit  PHQ9 & GAD7 [  ] new OB [  ] 28 weeks  [  ] 36 weeks Induction  [ ]  Orders Entered [ ] Foley Y/N      Past Medical History: Past Medical History:  Diagnosis Date   Inappropriate sinus tachycardia    Tachycardia    was taken off medication due to pregnancy-naldolol   Trichomonas infection 2017    Past Surgical History: Past Surgical History:  Procedure Laterality Date   WISDOM TOOTH EXTRACTION Bilateral     Obstetrical History: OB History     Gravida  2   Para  1   Term  1   Preterm  0   AB  0   Living  1      SAB  0   IAB  0   Ectopic  0   Multiple  0   Live Births  1           Social History Social History   Socioeconomic  History   Marital status: Single    Spouse name: Not on file   Number of children: Not on file   Years of education: Not on file   Highest education level: Not on file  Occupational History   Not on file  Tobacco Use   Smoking status: Former    Types: Cigarettes    Passive exposure: Past   Smokeless tobacco: Not on file  Vaping Use   Vaping Use: Never used  Substance and Sexual Activity   Alcohol use: Not Currently   Drug use: Not Currently   Sexual activity: Not Currently  Other Topics Concern   Not on file  Social History Narrative   Not on file   Social Determinants of Health   Financial Resource Strain: Not on file  Food Insecurity: Not on file  Transportation Needs: No Transportation Needs (02/01/2022)   PRAPARE - Administrator, Civil Service (Medical): No    Lack of Transportation (Non-Medical): No  Physical Activity: Not on file   Stress: Not on file  Social Connections: Not on file    Family History: Family History  Problem Relation Age of Onset   Hypertension Maternal Grandmother    Cancer Maternal Grandfather    Cancer Paternal Grandmother    Diabetes Other    Asthma Neg Hx    Heart disease Neg Hx    Stroke Neg Hx     Allergies: Allergies  Allergen Reactions   Aspirin Nausea And Vomiting    abd pain and vomiting abd pain and vomiting    Amoxicillin Itching and Swelling    Per patient, caused vaginal itching and swelling    Medications Prior to Admission  Medication Sig Dispense Refill Last Dose   cefdinir (OMNICEF) 300 MG capsule Take 1 capsule (300 mg total) by mouth 2 (two) times daily. 14 capsule 0    Prenatal Vit-Fe Fumarate-FA (PRENATAL VITAMIN) 27-0.8 MG TABS Take 1 tablet by mouth daily. 90 tablet 4      Review of Systems   All systems reviewed and negative except as stated in HPI  Blood pressure (!) 126/56, pulse 67, temperature 97.9 F (36.6 C), temperature source Axillary, resp. rate 18, height 5\' 1"  (1.549 m), weight 116 kg, last menstrual period 11/29/2021, SpO2 100 %. General appearance: alert, cooperative, and appears stated age Lungs: normal work of breathing Abdomen: soft, non-tender; gravid Extremities: Homans sign is negative, no sign of DVT  Presentation: cephalic Fetal monitoringBaseline: 135 bpm, Variability: Good {> 6 bpm), Accelerations: Reactive, and Decelerations: Absent Uterine activityFrequency: Every 1.5-3 minutes Dilation: 3.5 Effacement (%): 90 Station: -2 Exam by:: Dr. [email or web address]   Prenatal labs: ABO, Rh: --/--/PENDING (12/11 1107) Antibody: PENDING (12/11 1107) Rubella: 8.14 (06/05 0948) RPR: NON-REACTIVE (10/03 0900)  HBsAg: NON-REACTIVE (06/05 0948)  HIV: NON-REACTIVE (10/03 0900)  GBS:    1 hr Glucola nml   Genetic screening  nml  Anatomy 11-10-1987 low lying placenta now resolved  Prenatal Transfer Tool  Maternal Diabetes: No Genetic Screening:  Normal Maternal Ultrasounds/Referrals: Normal Fetal Ultrasounds or other Referrals:  None Maternal Substance Abuse:  No Significant Maternal Medications:  None Significant Maternal Lab Results:  Group B Strep negative Number of Prenatal Visits:greater than 3 verified prenatal visits Other Comments:  None  Results for orders placed or performed during the hospital encounter of 08/26/22 (from the past 24 hour(s))  Fern Test   Collection Time: 08/26/22 10:22 AM  Result Value Ref Range   POCT Fern Test Positive =  ruptured amniotic membanes   CBC   Collection Time: 08/26/22 11:07 AM  Result Value Ref Range   WBC 9.0 4.0 - 10.5 K/uL   RBC 4.65 3.87 - 5.11 MIL/uL   Hemoglobin 13.3 12.0 - 15.0 g/dL   HCT 19.1 47.8 - 29.5 %   MCV 84.1 80.0 - 100.0 fL   MCH 28.6 26.0 - 34.0 pg   MCHC 34.0 30.0 - 36.0 g/dL   RDW 62.1 30.8 - 65.7 %   Platelets 209 150 - 400 K/uL   nRBC 0.0 0.0 - 0.2 %  Type and screen [facility]   Collection Time: 08/26/22 11:07 AM  Result Value Ref Range   ABO/RH(D) PENDING    Antibody Screen PENDING    Sample Expiration      08/29/2022,2359 Performed at [facility], [address], [place], [place] [address]     Patient Active Problem List   Diagnosis Date Noted   Uterine contractions 08/26/2022   Obesity affecting pregnancy 08/20/2022   Excess weight gain in pregnancy 08/20/2022   History of preterm delivery, currently pregnant in third trimester 08/01/2022   Recurrent UTI 07/30/2022   UTI in pregnancy 04/12/2022   Low-lying placenta 04/12/2022   Supervision of other normal pregnancy, antepartum 02/18/2022   Obesity (BMI 35.0-39.9 without comorbidity) 02/14/2022   Inappropriate sinus tachycardia 02/04/2022   Papanicolaou smear of cervix with low grade squamous intraepithelial lesion (LGSIL) 07/05/2020   PCOS (polycystic ovarian syndrome) 07/05/2020   MDD (major depressive disorder), recurrent episode (HCC) 01/11/2018    Posttraumatic stress disorder 12/14/2015    Assessment/Plan:  Tayana Shankle is a 26 y.o. G2P1001 at [redacted]w[redacted]d here for expectant management of SOL. SROM at 0900.   #Labor: Expectant management. Patient low risk pregnancy and requesting intermittent monitoring to allow for moving around. #Pain: Request nitrous oxide. Epidural on request #FWB: Category 1.  #ID:  GBS negative #MOF: breast #MOC: declines #Circ:  declines  Billey Co, MD  08/26/2022, 1:38 PM

## 2022-08-27 ENCOUNTER — Encounter: Payer: Medicaid Other | Admitting: Obstetrics and Gynecology

## 2022-08-27 DIAGNOSIS — O165 Unspecified maternal hypertension, complicating the puerperium: Secondary | ICD-10-CM | POA: Diagnosis not present

## 2022-08-27 DIAGNOSIS — Z8679 Personal history of other diseases of the circulatory system: Secondary | ICD-10-CM | POA: Diagnosis not present

## 2022-08-27 LAB — CBC
HCT: 35.8 % — ABNORMAL LOW (ref 36.0–46.0)
Hemoglobin: 12 g/dL (ref 12.0–15.0)
MCH: 28.8 pg (ref 26.0–34.0)
MCHC: 33.5 g/dL (ref 30.0–36.0)
MCV: 85.9 fL (ref 80.0–100.0)
Platelets: 187 10*3/uL (ref 150–400)
RBC: 4.17 MIL/uL (ref 3.87–5.11)
RDW: 14.3 % (ref 11.5–15.5)
WBC: 14 10*3/uL — ABNORMAL HIGH (ref 4.0–10.5)
nRBC: 0 % (ref 0.0–0.2)

## 2022-08-27 LAB — COMPREHENSIVE METABOLIC PANEL
ALT: 11 U/L (ref 0–44)
AST: 20 U/L (ref 15–41)
Albumin: 2.2 g/dL — ABNORMAL LOW (ref 3.5–5.0)
Alkaline Phosphatase: 90 U/L (ref 38–126)
Anion gap: 9 (ref 5–15)
BUN: 10 mg/dL (ref 6–20)
CO2: 22 mmol/L (ref 22–32)
Calcium: 8.6 mg/dL — ABNORMAL LOW (ref 8.9–10.3)
Chloride: 106 mmol/L (ref 98–111)
Creatinine, Ser: 1.04 mg/dL — ABNORMAL HIGH (ref 0.44–1.00)
GFR, Estimated: >60 mL/min (ref >60)
Glucose, Bld: 112 mg/dL — ABNORMAL HIGH (ref 70–99)
Potassium: 3.7 mmol/L (ref 3.5–5.1)
Sodium: 137 mmol/L (ref 135–145)
Total Bilirubin: 0.3 mg/dL (ref 0.3–1.2)
Total Protein: 5.7 g/dL — ABNORMAL LOW (ref 6.5–8.1)

## 2022-08-27 MED ORDER — ONDANSETRON HCL 4 MG/2ML IJ SOLN: 4.0000 mg | INTRAMUSCULAR | Status: DC | PRN

## 2022-08-27 MED ORDER — COCONUT OIL OIL: 1.0000 | TOPICAL_OIL | Status: DC | PRN

## 2022-08-27 MED ORDER — BENZOCAINE-MENTHOL 20-0.5 % EX AERO
1.0000 | INHALATION_SPRAY | CUTANEOUS | Status: DC | PRN
  Administered 2022-08-27 (×2): 1 via TOPICAL
  Filled 2022-08-27 (×2): qty 56

## 2022-08-27 MED ORDER — DIBUCAINE (PERIANAL) 1 % EX OINT: 1.0000 | TOPICAL_OINTMENT | CUTANEOUS | Status: DC | PRN

## 2022-08-27 MED ORDER — SULFAMETHOXAZOLE-TRIMETHOPRIM 800-160 MG PO TABS
1.0000 | ORAL_TABLET | Freq: Two times a day (BID) | ORAL | Status: DC
  Administered 2022-08-27 – 2022-08-29 (×5): 1 via ORAL
  Filled 2022-08-27 (×9): qty 1

## 2022-08-27 MED ORDER — ONDANSETRON HCL 4 MG PO TABS: 4.0000 mg | ORAL_TABLET | ORAL | Status: DC | PRN

## 2022-08-27 MED ORDER — TETANUS-DIPHTH-ACELL PERTUSSIS 5-2.5-18.5 LF-MCG/0.5 IM SUSY: 0.5000 mL | PREFILLED_SYRINGE | Freq: Once | INTRAMUSCULAR | Status: DC

## 2022-08-27 MED ORDER — IBUPROFEN 600 MG PO TABS
600.0000 mg | ORAL_TABLET | Freq: Four times a day (QID) | ORAL | Status: DC
  Administered 2022-08-27 – 2022-08-29 (×7): 600 mg via ORAL
  Filled 2022-08-27 (×7): qty 1

## 2022-08-27 MED ORDER — ACETAMINOPHEN 325 MG PO TABS
650.0000 mg | ORAL_TABLET | ORAL | Status: DC | PRN
  Administered 2022-08-27 – 2022-08-29 (×5): 650 mg via ORAL
  Filled 2022-08-27 (×5): qty 2

## 2022-08-27 MED ORDER — FERROUS SULFATE 325 (65 FE) MG PO TABS
325.0000 mg | ORAL_TABLET | ORAL | Status: DC
  Administered 2022-08-27 – 2022-08-29 (×3): 325 mg via ORAL
  Filled 2022-08-27 (×2): qty 1

## 2022-08-27 MED ORDER — FUROSEMIDE 20 MG PO TABS
20.0000 mg | ORAL_TABLET | Freq: Every day | ORAL | Status: DC
  Administered 2022-08-27 – 2022-08-29 (×3): 20 mg via ORAL
  Filled 2022-08-27 (×4): qty 1

## 2022-08-27 MED ORDER — SIMETHICONE 80 MG PO CHEW: 80.0000 mg | CHEWABLE_TABLET | ORAL | Status: DC | PRN

## 2022-08-27 MED ORDER — METHYLERGONOVINE MALEATE 0.2 MG/ML IJ SOLN: 0.2000 mg | INTRAMUSCULAR | Status: DC | PRN

## 2022-08-27 MED ORDER — MEDROXYPROGESTERONE ACETATE 150 MG/ML IM SUSP: 150.0000 mg | INTRAMUSCULAR | Status: DC | PRN

## 2022-08-27 MED ORDER — SENNOSIDES-DOCUSATE SODIUM 8.6-50 MG PO TABS
2.0000 | ORAL_TABLET | ORAL | Status: DC
  Administered 2022-08-27 – 2022-08-28 (×3): 2 via ORAL
  Filled 2022-08-27 (×3): qty 2

## 2022-08-27 MED ORDER — METHYLERGONOVINE MALEATE 0.2 MG PO TABS: 0.2000 mg | ORAL_TABLET | ORAL | Status: DC | PRN

## 2022-08-27 MED ORDER — MEASLES, MUMPS & RUBELLA VAC IJ SOLR: 0.5000 mL | Freq: Once | INTRAMUSCULAR | Status: DC

## 2022-08-27 MED ORDER — WITCH HAZEL-GLYCERIN EX PADS: 1.0000 | MEDICATED_PAD | CUTANEOUS | Status: DC | PRN

## 2022-08-27 MED ORDER — NIFEDIPINE ER OSMOTIC RELEASE 30 MG PO TB24
30.0000 mg | ORAL_TABLET | Freq: Every day | ORAL | Status: DC
  Filled 2022-08-27 (×4): qty 1

## 2022-08-27 MED ORDER — DIPHENHYDRAMINE HCL 25 MG PO CAPS: 25.0000 mg | ORAL_CAPSULE | Freq: Four times a day (QID) | ORAL | Status: DC | PRN

## 2022-08-27 MED ORDER — BISACODYL 10 MG RE SUPP: 10.0000 mg | Freq: Every day | RECTAL | Status: DC | PRN

## 2022-08-27 MED ORDER — PRENATAL MULTIVITAMIN CH
1.0000 | ORAL_TABLET | Freq: Every day | ORAL | Status: DC
  Administered 2022-08-27 – 2022-08-29 (×3): 1 via ORAL
  Filled 2022-08-27 (×3): qty 1

## 2022-08-27 MED ORDER — FLEET ENEMA 7-19 GM/118ML RE ENEM: 1.0000 | ENEMA | Freq: Every day | RECTAL | Status: DC | PRN

## 2022-08-27 NOTE — Progress Notes (Signed)
Pharmacist called to say patient cannot breastfeed on Bactrim. Nurse asked if another medication can be given and she said not for this strain. NICU notified to give donor milk and not breastmilk while mom is on antibiotic. Patient was told to pump and dump while on Bactrim. Both patient and NICU stated they understood and baby never received breastmilk per NICU.

## 2022-08-27 NOTE — Progress Notes (Signed)
Post Partum Day 1 Subjective: no complaints, up ad lib, voiding, and tolerating PO. Denies HA/vis changes/CP/SOB/RUQ pain  Objective: Blood pressure 121/76, pulse 68, temperature 98.3 F (36.8 C), temperature source Oral, resp. rate 17, height 5\' 1"  (1.549 m), weight 116 kg, last menstrual period 11/29/2021, SpO2 100 %, unknown if currently breastfeeding.  Physical Exam:  General: alert, cooperative, and no distress Lochia: appropriate Uterine Fundus: firm DVT Evaluation: No evidence of DVT seen on physical exam.  Recent Labs    08/26/22 1107  HGB 13.3  HCT 39.1   Assessment/Plan: Postpartum - Contraception: declines - MOF: breast - Rh status: positive - Rubella status: immune - Dispo: likely PPD2 - Consults: none  Neonatal - Doing well in NICU - Circumcision: declines  3. Postpartum HTN - BP consistently > 135/85 since delivery - CBC/CMP ordered - lasix 20mg  daily x 5d - procardia XL 30mg  started this afternoon   LOS: 1 day   14/11/23 08/27/2022, 2:13 PM

## 2022-08-27 NOTE — Discharge Instructions (Signed)
-   Continue your prenatal vitamins especially if breastfeeding - Try to eat iron rich food. - Take iron supplement as prescribed every other day. Take along with fruit or orange juice, avoid taking within 30 mins of eating dairy (milk, cheese) - Take over the counter tylenol (500mg) or ibuprofen (200mg) three times a day as needed for cramping/pain. - continue blood pressure medication. - Take water pill (lasix) as prescribed for a total of 5 days. - follow up in clinic in 1 week for a blood pressure check and in 4-6 weeks as scheduled for your regular post partum visit. - Please come back to MAU if you notice persistently elevated blood pressures or you start to have a headache, that doesn't get better with medications (tylenol and ibuprofen), rest (4hrs of sleep) and drinking water.  

## 2022-08-27 NOTE — Anesthesia Postprocedure Evaluation (Signed)
Anesthesia Post Note  Patient: Kimberly Lewis  Procedure(s) Performed: AN AD HOC LABOR EPIDURAL     Patient location during evaluation: Mother Baby Anesthesia Type: Epidural Level of consciousness: awake and alert Pain management: pain level controlled Vital Signs Assessment: post-procedure vital signs reviewed and stable Respiratory status: spontaneous breathing, nonlabored ventilation and respiratory function stable Cardiovascular status: stable Postop Assessment: no headache, no backache and epidural receding Anesthetic complications: no   No notable events documented.  Last Vitals:  Vitals:   08/27/22 0559 08/27/22 0810  BP: 134/67 (!) 154/84  Pulse: 80 66  Resp: 15 16  Temp: 36.9 C 36.7 C  SpO2: 99% 100%    Last Pain:  Vitals:   08/27/22 0810  TempSrc: Oral  PainSc:    Pain Goal:                   Kimberly Lewis

## 2022-08-27 NOTE — Lactation Note (Signed)
This note was copied from a baby'Kimberly chart.  NICU Lactation Consultation Note  Patient Name: Kimberly Lewis [id number] Date: 08/27/2022 Age:27 hours  Subjective Reason for consult: NICU baby; Early term 35-38.6wks; Maternal endocrine disorder  Visited with family of 59 hours old ETI NICU female; Ms. Mcfarlane is a P2 and has some experience breastfeeding but that was 10 years ago. She was told by her MBU RN to pump and dump due to being on Bactrim (and L3); called NICU pharmacist Allie R. and she confirmed that it'Kimberly safe to provide breastmilk while on Bactrim; this LC also noted that the benefits of breastmilk will most likely outweigh the risk in this situation. Assisted with hand expression (small droplet of colostrum noted) and to position mom and baby for some STS care. Reviewed pumping schedule, lactogenesis II, benefits of breastmilk, IDF 1/2 and anticipatory guidelines.  Objective Infant data: Mother'Kimberly Current Feeding Choice: Breast Milk and Donor Milk  Infant feeding assessment Scale for Readiness: 2  Maternal data: G2P2002  Vaginal, Spontaneous Significant Breast History:: +) breast changes during the pregnancy Current breast feeding challenges:: NICU admission Pumping frequency: q 3 hours (recommended) Pumped volume: 0 mL Flange Size: 24 Risk factor for low milk supply:: infant separation, PCOS, obesity Pump: Hands Free  Assessment Infant: Feeding Status: -- (Scheduled feedings)  Maternal: Milk volume: Normal  Intervention/Plan Interventions: Breast feeding basics reviewed; Breast massage; Hand express; Coconut oil; DEBP; Education; [employer]; Infant Driven Feeding Algorithm education Tools: Pump; Flanges; Coconut oil Pump Education: Setup, frequency, and cleaning; Milk Storage  Plan of care: Encouraged pumping every 3 hours, ideally 8 pumping sessions/24 hours Breast massage, hand expression and coconut oil were also encouraged prior pumping She'll  start taking baby STS to an empty breast on feeding cues around feeding times; she politely declined latch assistance during [facility] consultation  FOB present. All questions and concerns answered, family to contact [facility] services PRN.  Consult Status: NICU follow-up  NICU Follow-up type: New admission follow up; Maternal D/C visit; Verify onset of copious milk; Verify absence of engorgement   Kimberly Lewis Kimberly Lewis 08/27/2022, 4:53 PM

## 2022-08-28 ENCOUNTER — Telehealth: Payer: Self-pay | Admitting: *Deleted

## 2022-08-28 LAB — SURGICAL PATHOLOGY

## 2022-08-28 NOTE — Telephone Encounter (Signed)
-----   Message from Alfredia Ferguson, MD sent at 08/27/2022  9:44 PM EST ----- Regarding: Postpartum Good morning Team, Kimberly Lewis developed post partum hypertension. She is being discharged today, but will benefit from a blood pressure check in the office either this Friday or next Monday.  Thank you. Sheppard Evens MD MPH OB Fellow, Faculty Practice [facility], Center for [facility] 08/27/2022

## 2022-08-28 NOTE — Clinical Social Work Maternal (Signed)
CLINICAL SOCIAL WORK MATERNAL/CHILD NOTE  Patient Details  Name: Kimberly Lewis MRN: [id number] Date of Birth: January 06, 1996  Date:  08/28/2022  Clinical Social Worker Initiating Note:  Abundio Miu, [place] Date/Time: Initiated:  08/28/22/1520     Child's Name:  Kimberly Lewis   Biological Parents:  Mother, Father (Father: Billy Fischer)   Need for Interpreter:  None   Reason for Referral:  Behavioral Health Concerns, Other (Comment) (Infant's NICU Admission)   Address:  [place] [address]    Phone number:  [phone] (home)     Additional phone number:   Household Members/Support Persons (HM/SP):   Household Member/Support Person 1, Household Member/Support Person 2, Household Member/Support Person 3   HM/SP Name Relationship DOB or Age  HM/SP -[place] FOB/Fiance    HM/SP -2 Dorris Fetch Vann-Jones son 53 years old  HM/SP -87 Samira M. Lazarus Salines daughter 65 years old  HM/SP -4        HM/SP -5        HM/SP -6        HM/SP -7        HM/SP -8          Natural Supports (not living in the home):  Immediate Family, Extended Family   Professional Supports: None   Employment: Unemployed   Type of Work:     Education:  Programmer, systems   Homebound arranged:    Museum/gallery curator Resources:  [employer]   Other Resources:  [employer], Physicist, medical     Cultural/Religious Considerations Which May Impact Care:    Strengths:  Ability to meet basic needs  , Engineer, materials, Home prepared for child  , Understanding of illness   Psychotropic Medications:         Pediatrician:    Careers adviser area  Pediatrician List:   Ecologist Other ([facility])  [place]      Pediatrician Fax Number:    Risk Factors/Current Problems:  Mental Health Concerns     Cognitive State:  Alert  , Able to Concentrate  , Linear Thinking  , Goal Oriented     Mood/Affect:  Calm  ,  Interested     CSW Assessment: CSW met with MOB at infant's bedside to complete psychosocial assessment. MOB was holding infant and on the phone. CSW introduced self. MOB granted CSW verbal permission to speak about anything while she was on the phone and shared that she was speaking with fiance. MOB's call was on speaker phone. CSW explained role. MOB was polite and remained engaged during assessment. MOB reported that she resides with FOB and two older children. MOB reported that they have all items needed to care for infant including a car seat and crib. CSW inquired about MOB's support system, MOB reported that she has a lot of supports and a good support system.   CSW inquired about MOB's mental health history. MOB reported that she was diagnosed with severe depression and PTSD more than ten years ago. MOB denied any current symptoms. MOB reported that she is not taking any medication nor participating in therapy at this time to treat her mental health diagnoses. MOB denied needing any therapy resources. MOB denied any history of postpartum depression. CSW inquired about how MOB was feeling emotionally since giving birth, MOB reported that she was feeling good. MOB presented calm and did not demonstrate any  acute mental health signs/symptoms. CSW assessed for safety, MOB denied SI and HI. CSW did not assess for domestic violence as MOB was on the phone.   CSW provided education regarding the baby blues period vs. perinatal mood disorders, discussed treatment and gave resources for mental health follow up if concerns arise.  CSW recommends self-evaluation during the postpartum time period using the New Mom Checklist from Postpartum Progress and encouraged MOB to contact a medical professional if symptoms are noted at any time.    CSW provided review of Sudden Infant Death Syndrome (SIDS) precautions.    CSW and MOB discussed infant's NICU admission. CSW informed MOB about the NICU, what to expect, and  supports available while infant is admitted to the NICU. MOB reported that they feel well informed about infant's care and denied any transportation barriers with visiting infant in the NICU. MOB denied any questions/concerns regarding the NICU.   CSW identifies no further need for intervention and no barriers to discharge at this time. MOB opted to call CSW if any needs/concerns arise versus CSW checking in weekly.    CSW Plan/Description:  Sudden Infant Death Syndrome (SIDS) Education, Perinatal Mood and Anxiety Disorder (PMADs) Education, No Further Intervention Required/No Barriers to Discharge    Burnis Medin, LCSW 08/28/2022, 3:24 PM

## 2022-08-28 NOTE — Progress Notes (Addendum)
Post Partum Day 2  Subjective: Patient doing well. Her baby is in the NICU. SO she has been up with baby. She states that her pain is well controlled with PO Tylenol and states Ibuprofen is "upsetting her stomach" and no longer wants to take the ibuprofen. She also complains about her right foot still being numb from her epidural. Denies swelling and is able to hear weight without difficulty. "Just feels tingly".     Objective: Blood pressure 121/71, pulse 71, temperature 98.2 F (36.8 C), temperature source Oral, resp. rate 20, height 5\' 1"  (1.549 m), weight 116 kg, last menstrual period 11/29/2021, SpO2 100 %, unknown if currently breastfeeding.  Physical Exam:  General: alert and cooperative Lochia: appropriate Uterine Fundus: firm Incision: N/A  DVT Evaluation: No evidence of DVT seen on physical exam. No cords or calf tenderness. No significant calf/ankle edema.  Recent Labs    08/26/22 1107 08/27/22 1427  HGB 13.3 12.0  HCT 39.1 35.8*    Assessment/Plan: - Offered patient the option to be discharged today, but patient desires 1 additional day d/t numbness of the right foot and BP management. - Plan for discharge tomorrow - Patient currently on Procardia and Lasix for PP HTN. Continue regimen.  - Also on Abx for UTI. Discussed to continue medication for 5 additional days.  - Patient agreeable to plan of care.  - Monitor right foot numbness.    LOS: 2 days   14/12/23, CNM 08/28/2022, 3:00 PM

## 2022-08-28 NOTE — Lactation Note (Signed)
This note was copied from a baby's chart. Lactation Consultation Note  Patient Name: Kimberly Lewis Date: 08/28/2022 Reason for consult: Follow-up assessment;NICU baby;Early term 37-38.6wks;Maternal endocrine disorder Age:27 hours  Request to assist mother with pumping.  Mother feels confident about pumping.  Set up cleaning/drying station and confirmed with Alinda Money per Pharmacy that mother does not need to pump and dump.  Reviewed milk storage guidelines.  Maternal Data Has patient been taught Hand Expression?: Yes  Feeding Mother's Current Feeding Choice: Breast Milk and Donor Milk  LATCH Score Latch: Repeated attempts needed to sustain latch, nipple held in mouth throughout feeding, stimulation needed to elicit sucking reflex. (strong rooting hunger cues, agitation despite hand expression)  Audible Swallowing: None  Type of Nipple: Everted at rest and after stimulation  Comfort (Breast/Nipple): Soft / non-tender  Hold (Positioning): Assistance needed to correctly position infant at breast and maintain latch.  LATCH Score: 6   Lactation Tools Discussed/Used Tools: Pump Flange Size: 24 Breast pump type: Double-Electric Breast Pump Pump Education: Milk Storage Reason for Pumping: stimulation  Interventions  Education  Discharge Pump: Personal;Hands Free  Consult Status Consult Status: NICU follow-up Date: 08/29/22 Follow-up type: In-patient    Kimberly Lewis [facility] [name] 08/28/2022, 11:15 AM

## 2022-08-28 NOTE — Lactation Note (Signed)
This note was copied from a baby's chart.  NICU Lactation Consultation Note  Patient Name: Kimberly Lewis [id number] Date: 08/28/2022 Age:27 hours  Subjective Reason for consult: Follow-up assessment; NICU baby; Early term 13-38.6wks; Maternal endocrine disorder; Maternal discharge (Potential maternal discharge on 08/29/2022)  Visited with family of 53 hours old ETI NICU female, Kimberly Lewis is a P2 and reports she has been pumping but not consistently. Explained the importance of consistent pumping for the onset of lactogenesis II and the prevention of engorgement, she voiced understanding. She might be going home tomorrow. Reviewed discharge education, lactogenesis II/III, pump settings, IDF 1/2 and anticipatory guidelines.  Objective Infant data: Mother's Current Feeding Choice: Breast Milk and Donor Milk  Infant feeding assessment Scale for Readiness: 1 Scale for Quality: 2 (fussiness and stress cues toward end)  Maternal data: G2P2002  Vaginal, Spontaneous Significant Breast History:: +) breast changes during the pregnancy Current breast feeding challenges:: NICU admission Does the patient have breastfeeding experience prior to this delivery?: Yes How long did the patient breastfeed?: 4 months Pumping frequency: 2 times/24 hours Pumped volume: 0 mL (droplets per mom) Flange Size: 24 Risk factor for low milk supply:: infant separation, PCOS, obesity Pump: Personal (Wireless pump)  Assessment Infant: LATCH Score: 6  Feeding Status: -- (Scheduled feedings)  Maternal: Milk volume: Normal  Intervention/Plan Interventions: Breast feeding basics reviewed; DEBP; Education Tools: Pump; Flanges Pump Education: Setup, frequency, and cleaning; Milk Storage  Plan of care: Encouraged pumping every 3 hours, ideally 8 pumping sessions/24 hours She'll take all pump parts to baby's room after her discharge She'll switch her pump settings from initiation to expression mode once  she starts getting 20 ml of EBM combined She'll continue taking baby STS to a full breast on feeding cues around feeding times   No other support person at this time. All questions and concerns answered, family to contact [facility] services PRN.  Consult Status: NICU follow-up  NICU Follow-up type: Verify onset of copious milk; Verify absence of engorgement; Weekly NICU follow up   [employer] 08/28/2022, 12:46 PM

## 2022-08-28 NOTE — Telephone Encounter (Signed)
Left patient a message that she has an appointment scheduled for 09/03/22 at 10:50 with a 10:35 AM arrival time for a blood pressure check. Also, 09/25/22 at 10:50 with a 10:35 AM arrival time. If patient is not able to keep those appointments please inform the office at least 24 hours in advance.

## 2022-08-29 ENCOUNTER — Other Ambulatory Visit (HOSPITAL_COMMUNITY): Payer: Self-pay

## 2022-08-29 MED ORDER — COCONUT OIL OIL: 1.0000 | TOPICAL_OIL | 0 refills | Status: DC | PRN

## 2022-08-29 MED ORDER — SENNOSIDES-DOCUSATE SODIUM 8.6-50 MG PO TABS
2.0000 | ORAL_TABLET | ORAL | 0 refills | Status: AC
  Filled 2022-08-29: qty 60, 30d supply, fill #0

## 2022-08-29 MED ORDER — IBUPROFEN 600 MG PO TABS
600.0000 mg | ORAL_TABLET | Freq: Four times a day (QID) | ORAL | 0 refills | Status: DC
  Filled 2022-08-29: qty 30, 8d supply, fill #0

## 2022-08-29 MED ORDER — SULFAMETHOXAZOLE-TRIMETHOPRIM 800-160 MG PO TABS
1.0000 | ORAL_TABLET | Freq: Two times a day (BID) | ORAL | 0 refills | Status: AC
  Filled 2022-08-29: qty 7, 4d supply, fill #0

## 2022-08-29 MED ORDER — FUROSEMIDE 20 MG PO TABS
20.0000 mg | ORAL_TABLET | Freq: Every day | ORAL | 0 refills | Status: DC
  Filled 2022-08-29: qty 3, 3d supply, fill #0

## 2022-08-29 MED ORDER — BENZOCAINE-MENTHOL 20-0.5 % EX AERO
1.0000 | INHALATION_SPRAY | CUTANEOUS | 0 refills | Status: DC | PRN
  Filled 2022-08-29: qty 78, fill #0

## 2022-08-29 MED ORDER — FERROUS SULFATE 325 (65 FE) MG PO TABS
325.0000 mg | ORAL_TABLET | ORAL | 0 refills | Status: DC
  Filled 2022-08-29: qty 17, 34d supply, fill #0

## 2022-08-29 MED ORDER — NIFEDIPINE ER 30 MG PO TB24
30.0000 mg | ORAL_TABLET | Freq: Every day | ORAL | 0 refills | Status: DC
  Filled 2022-08-29: qty 30, 30d supply, fill #0

## 2022-08-29 MED ORDER — ACETAMINOPHEN 325 MG PO TABS: 650.0000 mg | ORAL_TABLET | ORAL | Status: DC | PRN

## 2022-08-30 ENCOUNTER — Ambulatory Visit: Payer: Medicaid Other

## 2022-08-30 ENCOUNTER — Inpatient Hospital Stay (HOSPITAL_COMMUNITY)
Admission: AD | Admit: 2022-08-30 | Discharge: 2022-08-30 | Disposition: A | Discharge status: Home / Self Care | Payer: Medicaid Other | Attending: Obstetrics and Gynecology | Admitting: Obstetrics and Gynecology

## 2022-08-30 ENCOUNTER — Encounter (HOSPITAL_COMMUNITY): Payer: Self-pay | Admitting: Obstetrics and Gynecology

## 2022-08-30 DIAGNOSIS — O165 Unspecified maternal hypertension, complicating the puerperium: Secondary | ICD-10-CM | POA: Diagnosis not present

## 2022-08-30 DIAGNOSIS — O1205 Gestational edema, complicating the puerperium: Secondary | ICD-10-CM | POA: Diagnosis not present

## 2022-08-30 DIAGNOSIS — R42 Dizziness and giddiness: Secondary | ICD-10-CM | POA: Insufficient documentation

## 2022-08-30 DIAGNOSIS — Z3A00 Weeks of gestation of pregnancy not specified: Secondary | ICD-10-CM | POA: Diagnosis not present

## 2022-08-30 DIAGNOSIS — O9089 Other complications of the puerperium, not elsewhere classified: Secondary | ICD-10-CM | POA: Diagnosis not present

## 2022-08-30 DIAGNOSIS — Z79899 Other long term (current) drug therapy: Secondary | ICD-10-CM | POA: Diagnosis not present

## 2022-08-30 DIAGNOSIS — R609 Edema, unspecified: Secondary | ICD-10-CM | POA: Diagnosis present

## 2022-08-30 LAB — URINALYSIS, ROUTINE W REFLEX MICROSCOPIC
Bacteria, UA: NONE SEEN
Bilirubin Urine: NEGATIVE
Glucose, UA: NEGATIVE mg/dL
Ketones, ur: NEGATIVE mg/dL
Leukocytes,Ua: NEGATIVE
Nitrite: NEGATIVE
Protein, ur: NEGATIVE mg/dL
Specific Gravity, Urine: 1.013 (ref 1.005–1.030)
pH: 6 (ref 5.0–8.0)

## 2022-08-30 LAB — HEMOGLOBIN AND HEMATOCRIT, BLOOD
HCT: 36.7 % (ref 36.0–46.0)
Hemoglobin: 12.7 g/dL (ref 12.0–15.0)

## 2022-08-30 NOTE — MAU Note (Signed)
.  Kimberly Lewis is a 26 y.o. at Unknown here in MAU reporting: she was discharged from the hospital yesterday with instructions to return for any PIH symptoms. She has noticed swelling in her lower extremities that started today and she has also felt some dizziness today. She has been up in the NICU visiting her baby and has not been able to take her BP. She is currently taking Lasix. Denies HA, visual changes, RUQ/epigastric pain. She reports some hemorrhoidal pain 5/10 that began after her delivery. LMP: N/A Onset of complaint: Today Pain score: 5/10 Vitals:   08/30/22 1728  BP: (!) 126/56  Pulse: 78  Resp: 18  Temp: 98.9 F (37.2 C)  SpO2: 98%     FHT:N/A Lab orders placed from triage:  UA

## 2022-08-30 NOTE — MAU Provider Note (Signed)
History     [id number]  Arrival date and time: 08/30/22 1709    Chief Complaint  Patient presents with   Hypertension    Lower extremity swelling      HPI Kimberly Lewis is a 26 y.o. at 4 days post partum who presents for LE swelling & dizziness. Had a vaginal delivery on 12/11. Was discharged home on lasix & procardia due to postpartum hypertension.  Reports noticed increase in bilateral lower leg swelling this morning. Also has had episodes of dizziness today. Admits she hasn't had much to eat & drink today since she's been visiting her baby in NICU. Denies headache, visual disturbance, epigastric pain, chest pain, SOB, palpitations, cough, calf pain, fever, or vaginal bleeding.    OB History     Gravida  2   Para  2   Term  2   Preterm  0   AB  0   Living  2      SAB  0   IAB  0   Ectopic  0   Multiple  0   Live Births  2           Past Medical History:  Diagnosis Date   Inappropriate sinus tachycardia    Tachycardia    was taken off medication due to pregnancy-naldolol   Trichomonas infection 2017    Past Surgical History:  Procedure Laterality Date   WISDOM TOOTH EXTRACTION Bilateral     Family History  Problem Relation Age of Onset   Hypertension Maternal Grandmother    Cancer Maternal Grandfather    Cancer Paternal Grandmother    Diabetes Other    Asthma Neg Hx    Heart disease Neg Hx    Stroke Neg Hx     Allergies  Allergen Reactions   Aspirin Nausea And Vomiting    abd pain and vomiting abd pain and vomiting    Amoxicillin Itching and Swelling    Per patient, caused vaginal itching and swelling    No current facility-administered medications on file prior to encounter.   Current Outpatient Medications on File Prior to Encounter  Medication Sig Dispense Refill   acetaminophen (TYLENOL) 325 MG tablet Take 2 tablets (650 mg total) by mouth every 4 (four) hours as needed (for pain scale < 4).     benzocaine-Menthol  (DERMOPLAST) 20-0.5 % AERO Apply 1 Application topically as needed for irritation (perineal discomfort). 78 g 0   ferrous sulfate 325 (65 FE) MG tablet Take 1 tablet (325 mg total) by mouth every other day. 30 tablet 0   furosemide (LASIX) 20 MG tablet Take 1 tablet (20 mg total) by mouth daily for 3 days. 3 tablet 0   senna-docusate (SENOKOT-S) 8.6-50 MG tablet Take 2 tablets by mouth daily. 60 tablet 0   sulfamethoxazole-trimethoprim (BACTRIM DS) 800-160 MG tablet Take 1 tablet by mouth every 12 (twelve) hours for 7 doses. 7 tablet 0   coconut oil OIL Apply 1 Application topically as needed.  0   ibuprofen (ADVIL) 600 MG tablet Take 1 tablet (600 mg total) by mouth every 6 (six) hours. 30 tablet 0   NIFEdipine (ADALAT CC) 30 MG 24 hr tablet Take 1 tablet (30 mg total) by mouth daily. 30 tablet 0   Prenatal Vit-Fe Fumarate-FA (PRENATAL VITAMIN) 27-0.8 MG TABS Take 1 tablet by mouth daily. 90 tablet 4     ROS Pertinent positives and negative per HPI, all others reviewed and negative  Physical Exam   BP  131/64   Pulse 80   Temp 98.9 F (37.2 C) (Oral)   Resp 18   Ht 5\' 1"  (1.549 m)   Wt 108.3 kg   SpO2 98%   Breastfeeding Yes   BMI 45.12 kg/m   Patient Vitals for the past 24 hrs:  BP Temp Temp src Pulse Resp SpO2 Height Weight  08/30/22 1851 131/64 -- -- 80 -- -- -- --  08/30/22 1738 122/71 -- -- 75 -- -- -- --  08/30/22 1728 (!) 126/56 98.9 F (37.2 C) Oral 78 18 98 % 5\' 1"  (1.549 m) 108.3 kg    Physical Exam Vitals and nursing note reviewed.  Constitutional:      General: She is not in acute distress.    Appearance: Normal appearance.  HENT:     Head: Normocephalic and atraumatic.  Eyes:     General: No scleral icterus.    Conjunctiva/sclera: Conjunctivae normal.  Cardiovascular:     Rate and Rhythm: Normal rate and regular rhythm.     Heart sounds: Normal heart sounds.  Pulmonary:     Effort: Pulmonary effort is normal. No respiratory distress.  Musculoskeletal:      Right lower leg: 1+ Pitting Edema present.     Left lower leg: 1+ Pitting Edema present.  Neurological:     Mental Status: She is alert.        Labs Results for orders placed or performed during the hospital encounter of 08/30/22 (from the past 24 hour(s))  Urinalysis, Routine w reflex microscopic Urine, Clean Catch     Status: Abnormal   Collection Time: 08/30/22  5:51 PM  Result Value Ref Range   Color, Urine YELLOW YELLOW   APPearance CLEAR CLEAR   Specific Gravity, Urine 1.013 1.005 - 1.030   pH 6.0 5.0 - 8.0   Glucose, UA NEGATIVE NEGATIVE mg/dL   Hgb urine dipstick LARGE (A) NEGATIVE   Bilirubin Urine NEGATIVE NEGATIVE   Ketones, ur NEGATIVE NEGATIVE mg/dL   Protein, ur NEGATIVE NEGATIVE mg/dL   Nitrite NEGATIVE NEGATIVE   Leukocytes,Ua NEGATIVE NEGATIVE   RBC / HPF 6-10 0 - 5 RBC/hpf   WBC, UA 0-5 0 - 5 WBC/hpf   Bacteria, UA NONE SEEN NONE SEEN   Squamous Epithelial / LPF 0-5 0 - 5  Hemoglobin and hematocrit, blood     Status: None   Collection Time: 08/30/22  6:15 PM  Result Value Ref Range   Hemoglobin 12.7 12.0 - 15.0 g/dL   HCT 09/01/22 09/01/22 - 66.0 %    Imaging No results found.  MAU Course  Procedures Lab Orders         Urinalysis, Routine w reflex microscopic Urine, Clean Catch         Hemoglobin and hematocrit, blood    No orders of the defined types were placed in this encounter.  Imaging Orders  No imaging studies ordered today    MDM Patient concerned regarding preeclampsia due to swelling & dizziness.  Minimal LE swelling that appears equal bilaterally. Normotensive in MAU.  U/a & hemoglobin normal.   Swelling appears normal for PP state.  Dizziness may be related to lack of intake today with stress of visiting baby in NICU. Patient is currently asymptomatic & stable for discharge.  Assessment and Plan   1. Postpartum edema    -Continue meds as prescribed -Reviewed s/s of PP preeclampsia   63.0, NP 08/30/22 7:15 PM

## 2022-08-31 ENCOUNTER — Ambulatory Visit: Payer: Self-pay

## 2022-08-31 NOTE — Lactation Note (Signed)
This note was copied from a baby's chart.  NICU Lactation Consultation Note  Patient Name: Boy Cherylin Waguespack [id number] Date: 08/31/2022 Age:27 days   Subjective Reason for consult: Mother's request; Follow-up assessment  Lactation returned to room to provide a [facility] Loaner pump. Pump #[id number] should be returned by 09/17/2022.  Objective Infant data: Mother's Current Feeding Choice: Breast Milk and Donor Milk  Infant feeding assessment Scale for Readiness: 1 Scale for Quality: 1  Maternal data: G2P2002  Vaginal, Spontaneous   Pump: WIC Loaner   Intervention/Plan Interventions: [employer] brochure; Education  Plan: Consult Status: Complete  No data recorded   Walker Shadow 08/31/2022, 2:34 PM

## 2022-08-31 NOTE — Lactation Note (Signed)
This note was copied from a baby's chart.  NICU Lactation Consultation Note  Patient Name: Kimberly Lewis Date: 08/31/2022 Age:26 years  Subjective Reason for consult: Follow-up assessment; NICU baby; Early term 30-38.6wks  Lactation followed up with Ladona Ridgel in room. Infant is being discharged today. Parent was on the phone upon entry working on obtaining a breast pump. I spoke to her about our [facility] loaner pump option. She will discuss this with her support person and call if she would like a pump.  I recommended that she call WIC in [facility] about obtaining a pump for home use.  I provided our community resources sheet.  Objective Infant data: Mother's Current Feeding Choice: Breast Milk and Donor Milk  Infant feeding assessment Scale for Readiness: 1 Scale for Quality: 1  Maternal data: G2P2002  Vaginal, Spontaneous   Pump:  (Needs a pump; will call WIC on Monday; may opt for [facility] loaner)   Intervention/Plan Interventions: [facility] brochure; Education  Plan: Consult Status: NICU follow-up    Walker Shadow 08/31/2022, 12:49 PM

## 2022-09-03 ENCOUNTER — Ambulatory Visit (INDEPENDENT_AMBULATORY_CARE_PROVIDER_SITE_OTHER): Payer: Medicaid Other

## 2022-09-03 ENCOUNTER — Ambulatory Visit: Payer: Medicaid Other

## 2022-09-03 VITALS — BP 122/81 | HR 78

## 2022-09-03 DIAGNOSIS — O165 Unspecified maternal hypertension, complicating the puerperium: Secondary | ICD-10-CM

## 2022-09-03 NOTE — Progress Notes (Signed)
Subjective:  Kimberly Lewis is a 27 y.o. female here for BP check.   Hypertension ROS: not taking medications regularly as instructed, no medication side effects noted, no TIA's, no chest pain on exertion, no dyspnea on exertion, and no swelling of ankles.    Objective:  There were no vitals taken for this visit.  Appearance alert, well appearing, and in no distress. General exam BP noted to be well controlled today in office.    Assessment:   121/81  Pulse 78 Blood Pressure well controlled.   Plan:  Current treatment plan is effective, no change in therapy.Marland Kitchen

## 2022-09-06 ENCOUNTER — Ambulatory Visit: Payer: Medicaid Other | Attending: Cardiology | Admitting: Cardiology

## 2022-09-12 ENCOUNTER — Encounter: Payer: Self-pay | Admitting: Cardiology

## 2022-09-25 ENCOUNTER — Encounter: Payer: Self-pay | Admitting: Obstetrics and Gynecology

## 2022-09-25 ENCOUNTER — Ambulatory Visit (INDEPENDENT_AMBULATORY_CARE_PROVIDER_SITE_OTHER): Payer: Medicaid Other | Admitting: Obstetrics and Gynecology

## 2022-09-25 MED ORDER — METOCLOPRAMIDE HCL 10 MG PO TABS: ORAL_TABLET | ORAL | 0 refills | Status: DC

## 2022-09-25 NOTE — Progress Notes (Signed)
[place] Partum Visit Note  Bea Duren is a 27 y.o. G87P2002 female who presents for a postpartum visit. She is 4 weeks postpartum following a normal spontaneous vaginal delivery.  I have fully reviewed the prenatal and intrapartum course. The delivery was at 38.4 gestational weeks.  Anesthesia: epidural. Postpartum course has been unremarkable. Baby is doing well. Baby is feeding by breast. Bleeding staining only. Bowel function is normal. Bladder function is normal. Patient is not sexually active. Contraception method is abstinence. Postpartum depression screening: negative.   The pregnancy intention screening data noted above was reviewed. Potential methods of contraception were discussed. The patient elected to proceed with No data recorded.   Edinburgh Postnatal Depression Scale - 09/25/22 1113       Edinburgh Postnatal Depression Scale:  In the Past 7 Days   I have been able to laugh and see the funny side of things. 0    I have looked forward with enjoyment to things. 0    I have blamed myself unnecessarily when things went wrong. 0    I have been anxious or worried for no good reason. 0    I have felt scared or panicky for no good reason. 0    Things have been getting on top of me. 0    I have been so unhappy that I have had difficulty sleeping. 0    I have felt sad or miserable. 0    I have been so unhappy that I have been crying. 0    The thought of harming myself has occurred to me. 0    Edinburgh Postnatal Depression Scale Total 0             Health Maintenance Due  Topic Date Due   COVID-19 Vaccine (1) Never done   HPV VACCINES (2 - 2-dose series) 09/07/2008       Review of Systems Pertinent items are noted in HPI.  Objective:  BP 103/68   Pulse 82   Ht 5\' 1"  (1.549 m)   Wt 232 lb (105.2 kg)   Breastfeeding Yes   BMI 43.84 kg/m    General:  alert, cooperative, and no distress   Breasts:  normal  Lungs: clear to auscultation bilaterally  Heart:   regular rate and rhythm  Abdomen: soft, non-tender; bowel sounds normal; no masses,  no organomegaly   Wound N/a  GU exam:  not indicated       Assessment:    There are no diagnoses linked to this encounter.  Normal postpartum exam.   Plan:   Essential components of care per ACOG recommendations:  1.  Mood and well being: Patient with negative depression screening today. Reviewed local resources for support.  - Patient tobacco use? No.   - hx of drug use? No.    2. Infant care and feeding:  -Patient currently breastmilk feeding? Yes. Reviewed importance of draining breast regularly to support lactation.  Rx Reglan provided to assist with milk production -Social determinants of health (SDOH) reviewed in EPIC. No concerns  3. Sexuality, contraception and birth spacing - Patient does not want a pregnancy in the next year.  Desired family size is 2 children.  - Reviewed reproductive life planning. Reviewed contraceptive methods based on pt preferences and effectiveness.  Patient desired Female Condom today.   - Discussed birth spacing of 18 months  4. Sleep and fatigue -Encouraged family/partner/community support of 4 hrs of uninterrupted sleep to help with mood and fatigue  5. Physical Recovery  - Discussed patients delivery and complications. She describes her labor as good. - Patient had a Vaginal, no problems at delivery. Patient had no laceration. Perineal healing reviewed. Patient expressed understanding - Patient has urinary incontinence? No. - Patient is safe to resume physical and sexual activity  6.  Health Maintenance - HM due items addressed Yes - Last pap smear  Diagnosis  Date Value Ref Range Status  02/18/2022   Final   - Negative for intraepithelial lesion or malignancy (NILM)   Pap smear not done at today's visit.  -Breast Cancer screening indicated? No.   7. Chronic Disease/Pregnancy Condition follow up: None  - PCP follow up  Mora Bellman, [facility]  for [place]

## 2023-01-28 ENCOUNTER — Ambulatory Visit: Payer: Medicaid Other | Admitting: Obstetrics and Gynecology

## 2023-07-22 IMAGING — US US OB < 14 WEEKS - US OB TV
1 series · 15 of 28 positions shown · non-contrast
Comparison: None.

CLINICAL DATA: Pregnancy.  Abdominal cramping

EXAM:
OBSTETRIC <14 WK US AND TRANSVAGINAL OB US
TECHNIQUE: Both transabdominal and transvaginal ultrasound examinations were
performed for complete evaluation of the gestation as well as the
maternal uterus, adnexal regions, and pelvic cul-de-sac.
Transvaginal technique was performed to assess early pregnancy.

[Series 1: us ob < 14 weeks - us ob tv · 111 acquisitions, 15 frames shown]
[im 1/111]
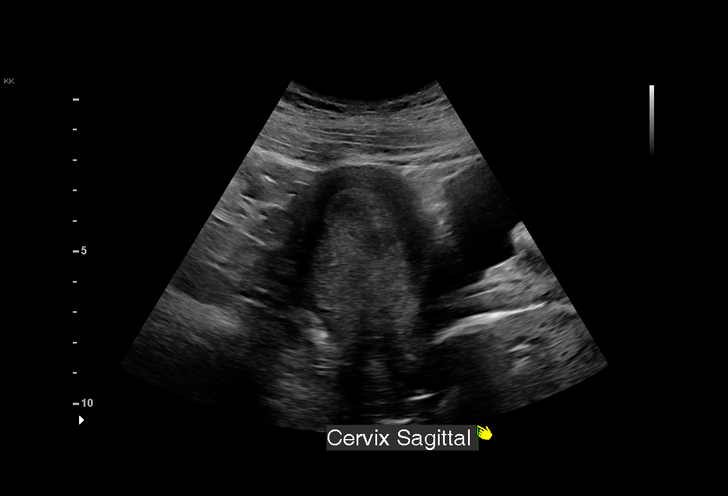
[im 9/111]
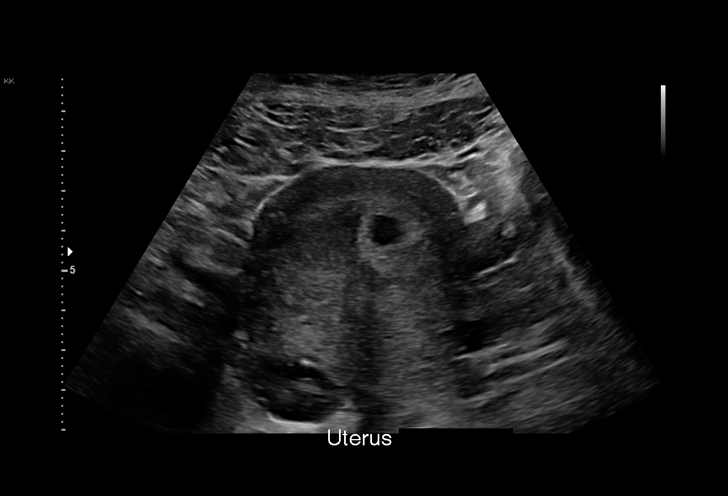
[im 17/111]
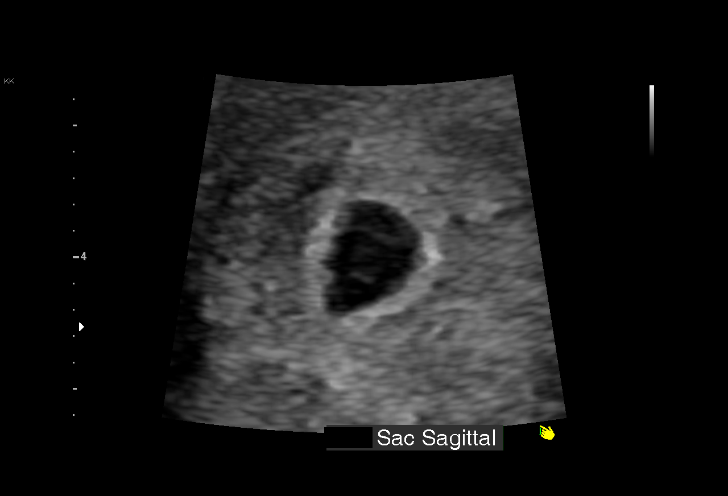
[im 25/111]
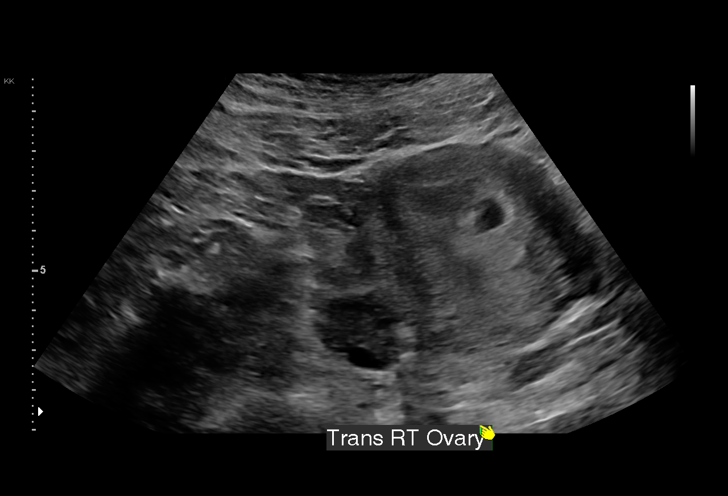
[im 33/111]
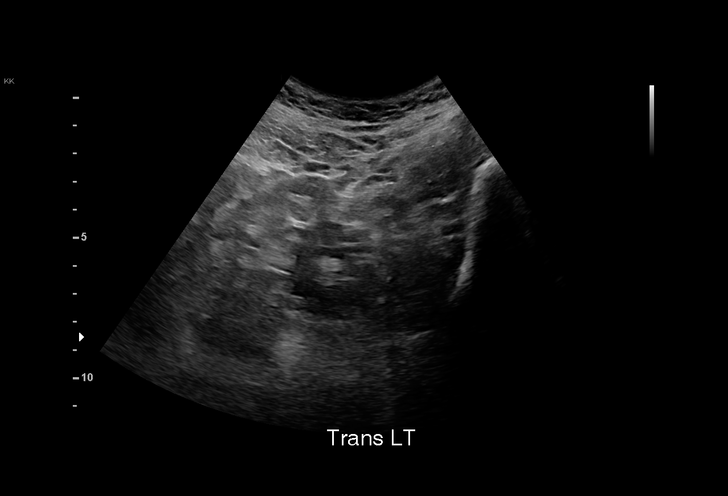
[im 41/111]
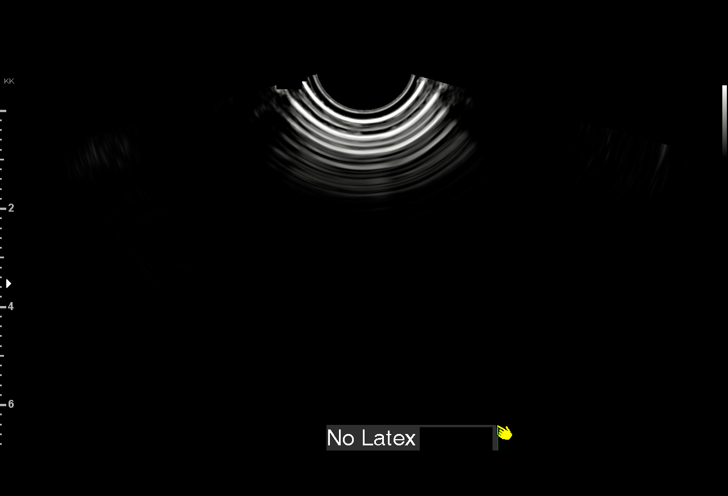
[im 49/111]
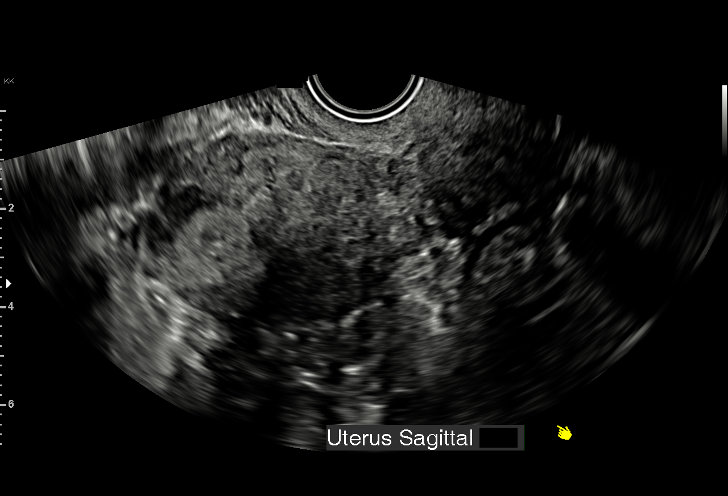
[im 58/111]
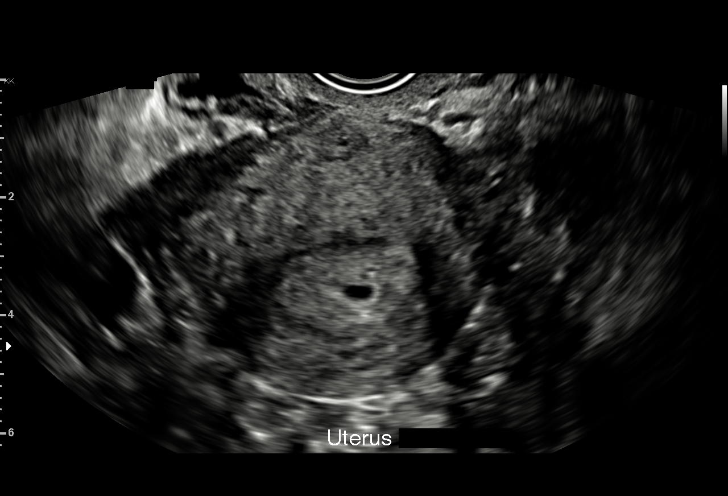
[im 62/111]
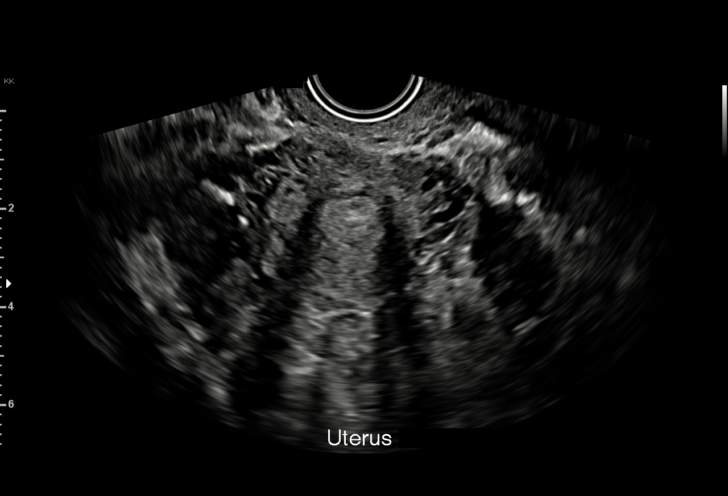
[im 70/111]
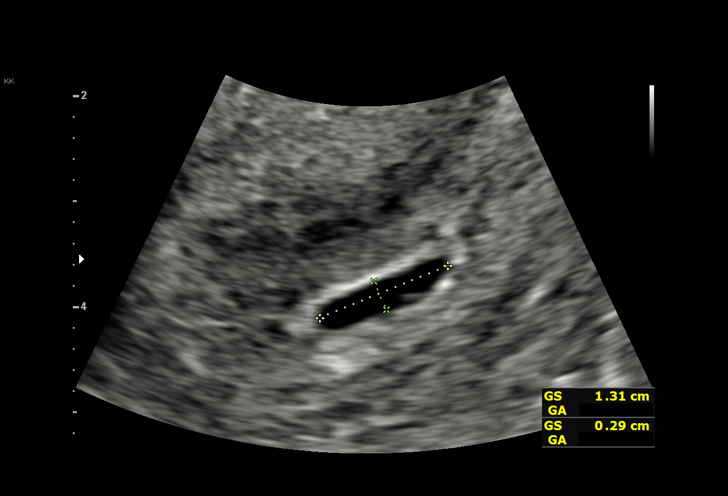
[im 78/111]
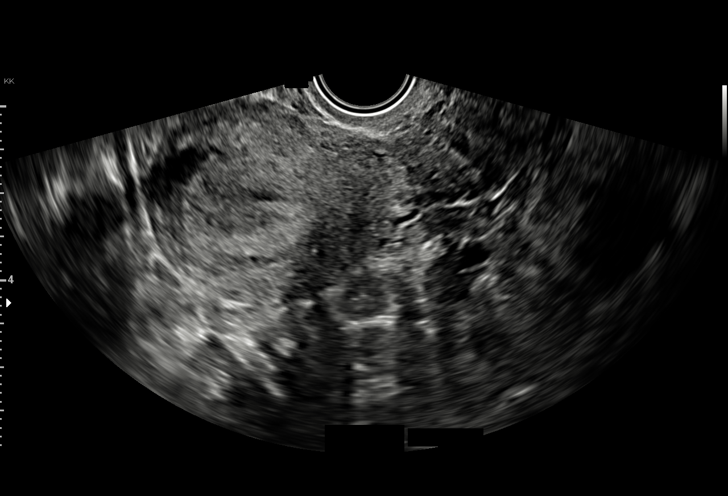
[im 86/111]
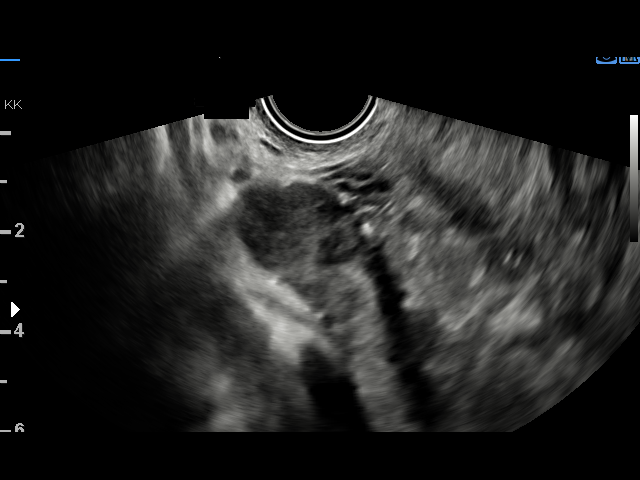
[im 94/111]
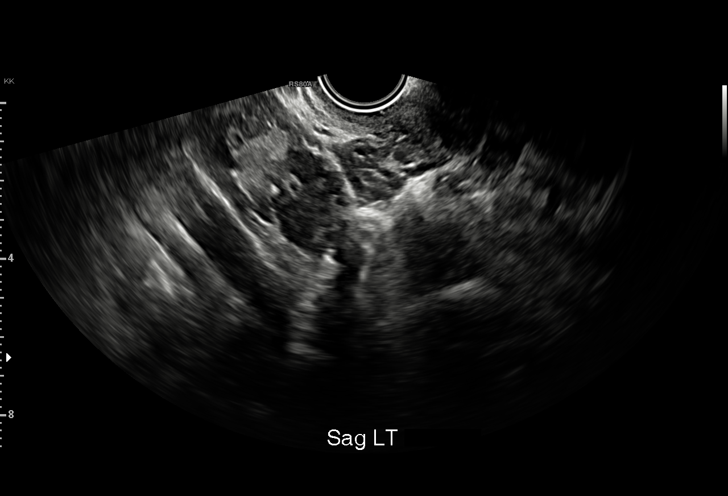
[im 102/111]
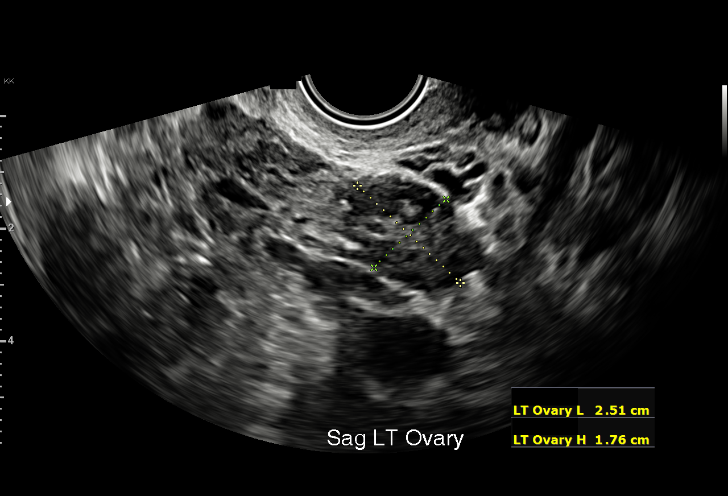
[im 111/111]
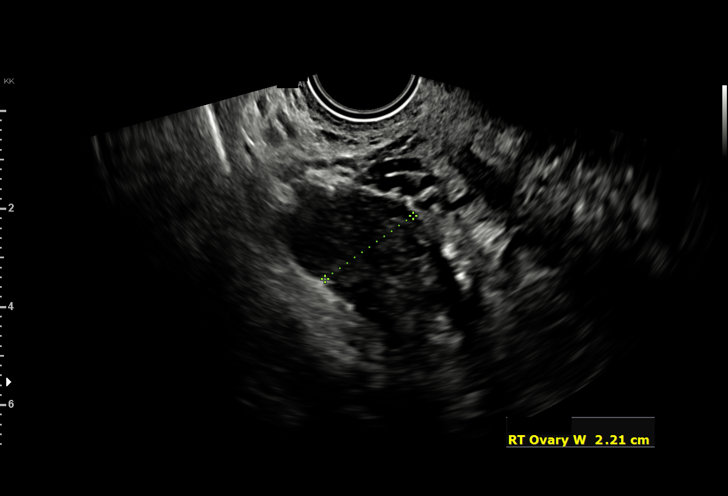

[15 of 28 positions shown; findings below may reference images not displayed]

FINDINGS: Intrauterine gestational sac: Single

Yolk sac:  Visualized.

Embryo:  Not Visualized.

Cardiac Activity: Not Visualized.

MSD: 8.4 mm   5 w   4 d

Subchorionic hemorrhage:  None visualized.

Maternal uterus/adnexae: Bilateral ovaries within normal limits. No
adnexal abnormality. No free fluid within the pelvis.
IMPRESSION: Early intrauterine gestational sac containing yolk sac. No embryo is
visualized at this time. By mean sac diameter, pregnancy measures 5
weeks 4 days. Recommend follow-up quantitative B-HCG levels and
follow-up US in 14 days to assess viability. This recommendation
follows SRU consensus guidelines: Diagnostic Criteria for Nonviable
Pregnancy Early in the First Trimester. N Engl J Med 0506;

## 2023-08-05 IMAGING — US US OB TRANSVAGINAL
1 series · 14 of 28 positions shown · non-contrast
Comparison: Prior ultrasound from 01/10/2022.

CLINICAL DATA: Initial evaluation for early pregnancy, assess
viability.

EXAM:
TRANSVAGINAL OB ULTRASOUND
TECHNIQUE: Transvaginal ultrasound was performed for complete evaluation of the
gestation as well as the maternal uterus, adnexal regions, and
pelvic cul-de-sac.

[Series 1: us ob transvaginal · 77 acquisitions, 14 frames shown]
[im 3/77]
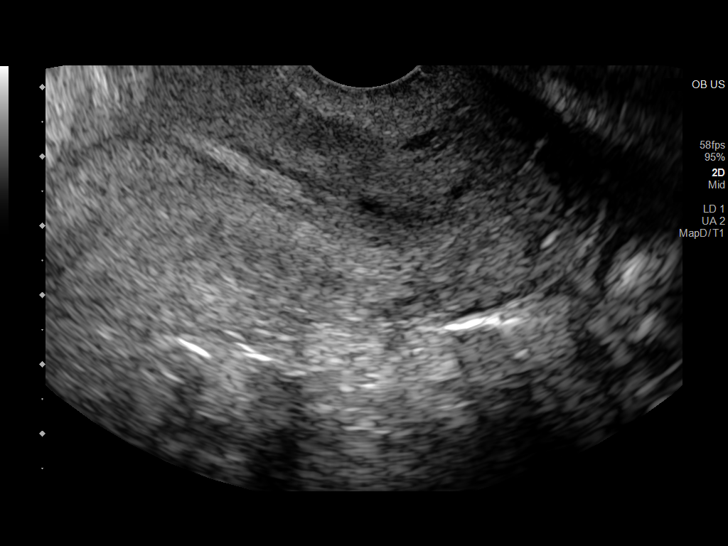
[im 9/77]
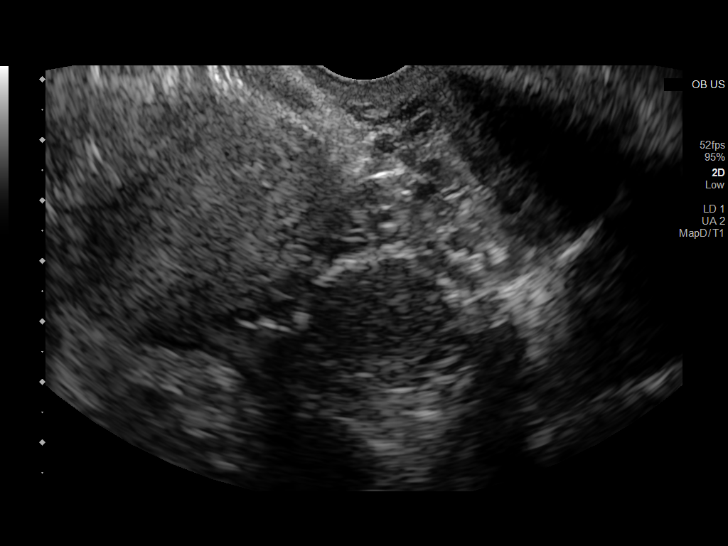
[im 15/77]
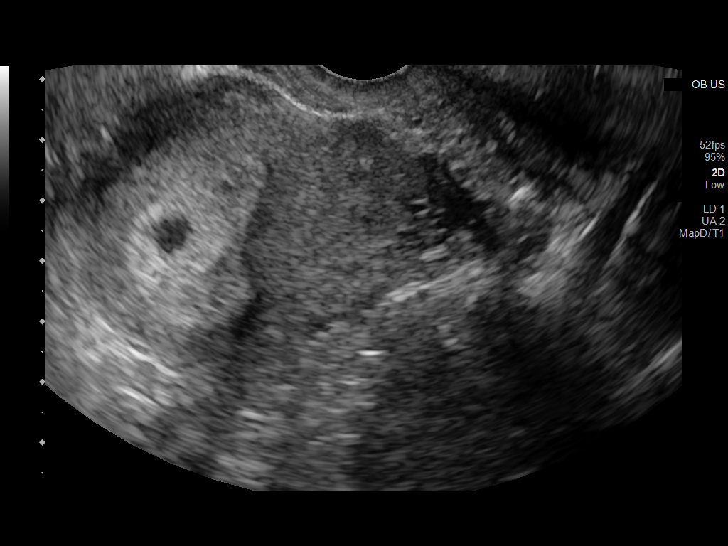
[im 20/77]
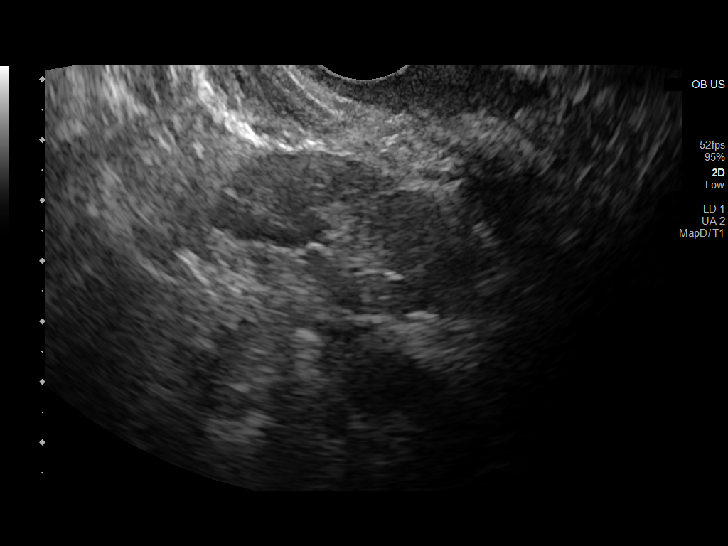
[im 26/77]
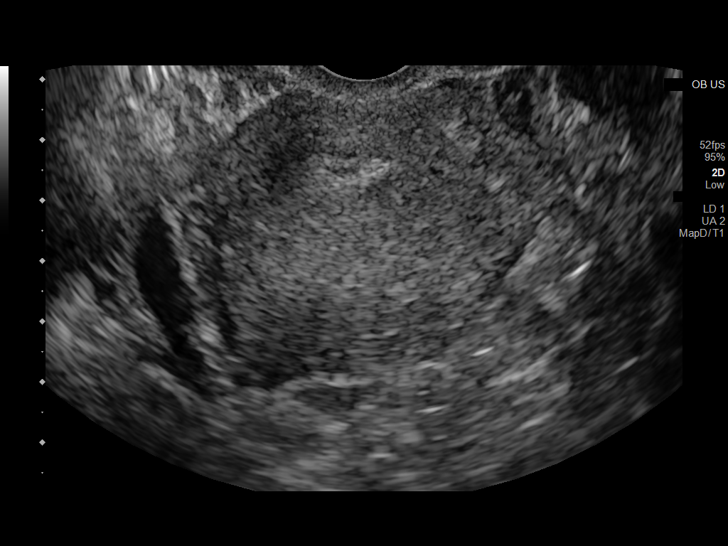
[im 31/77]
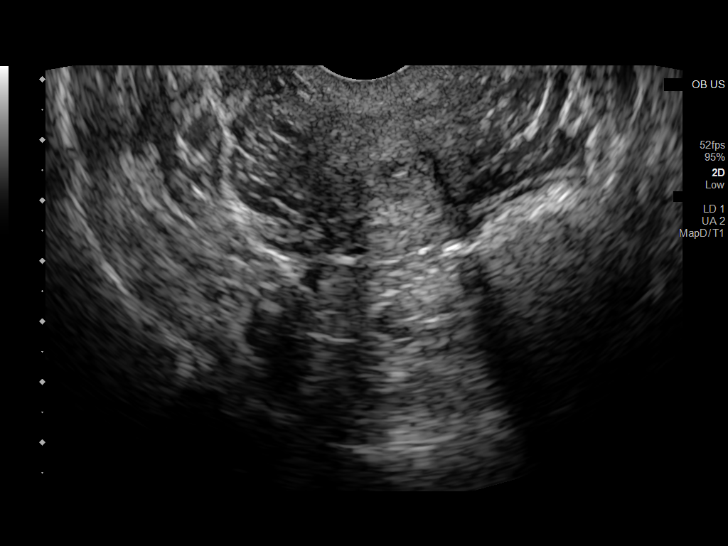
[im 37/77]
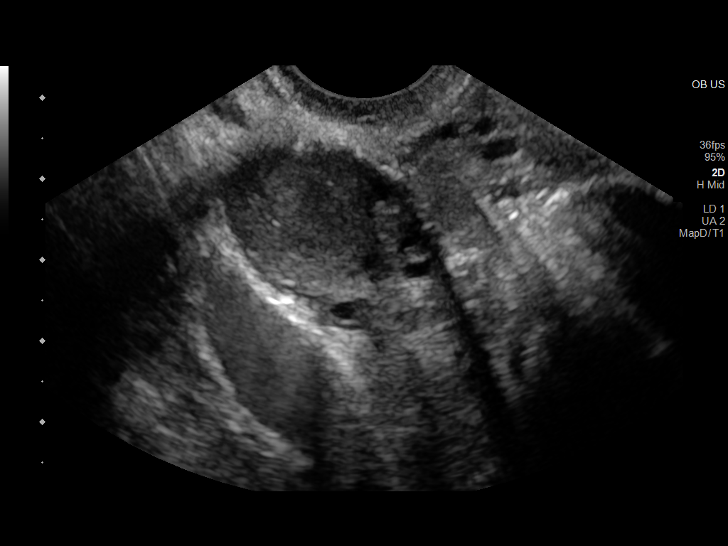
[im 43/77]
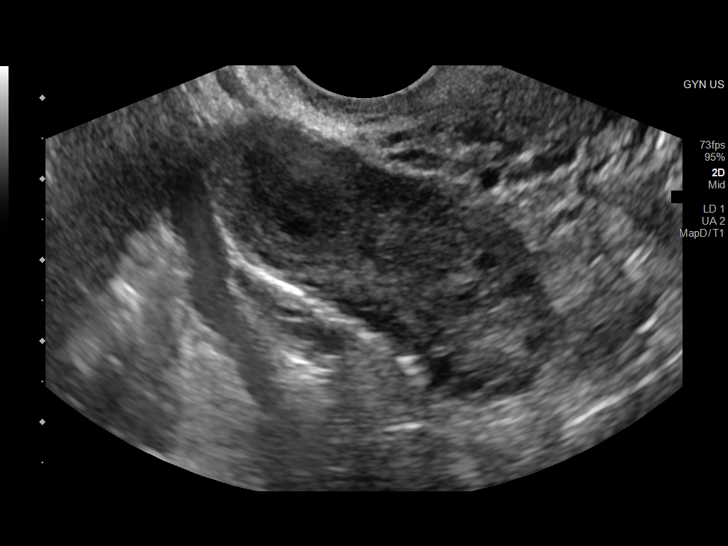
[im 48/77]
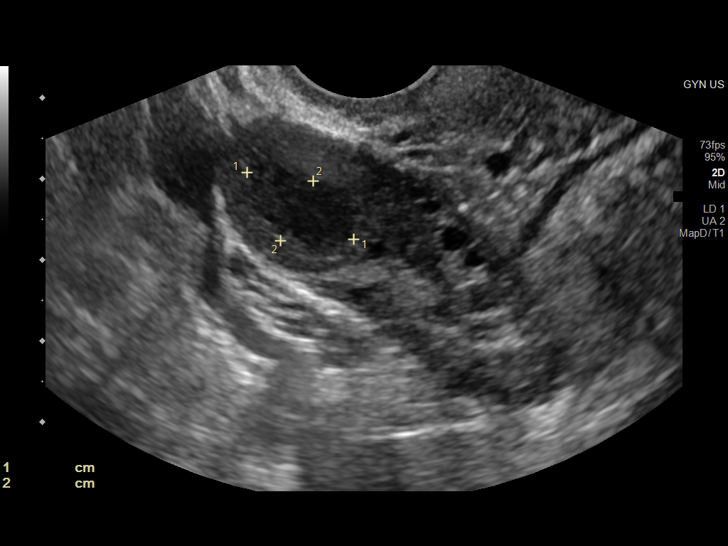
[im 54/77]
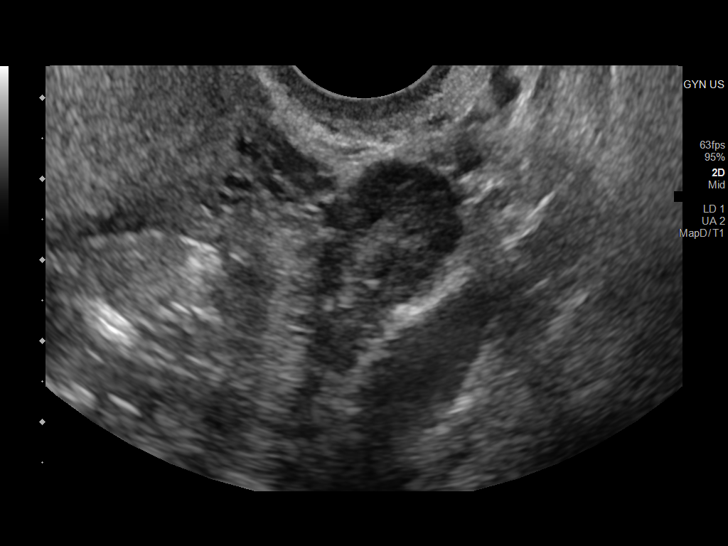
[im 60/77]
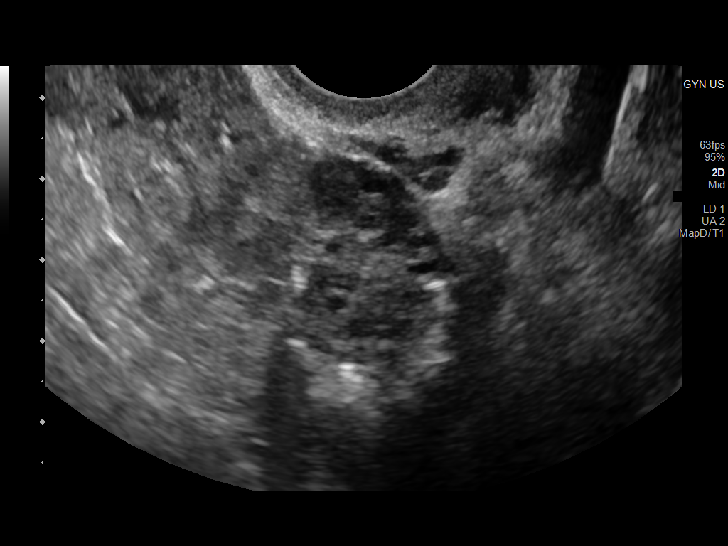
[im 65/77]
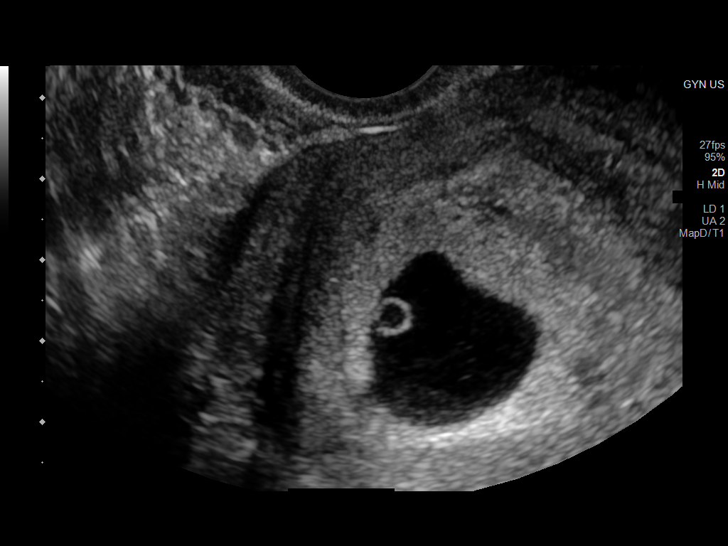
[im 71/77]
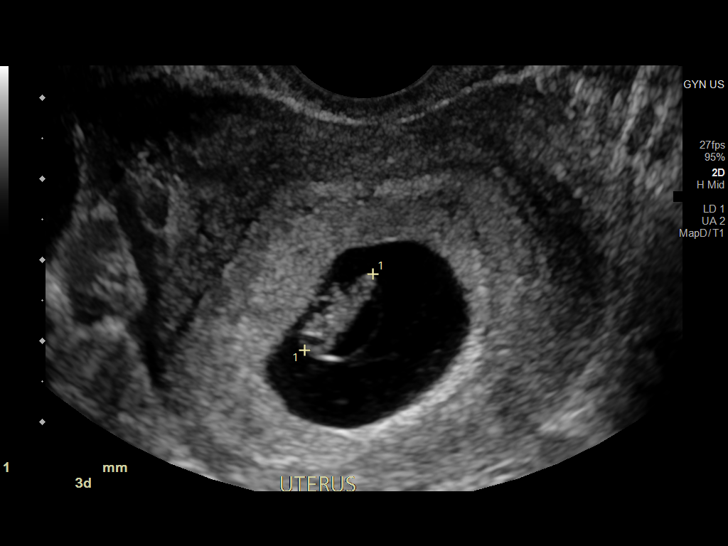
[im 77/77]
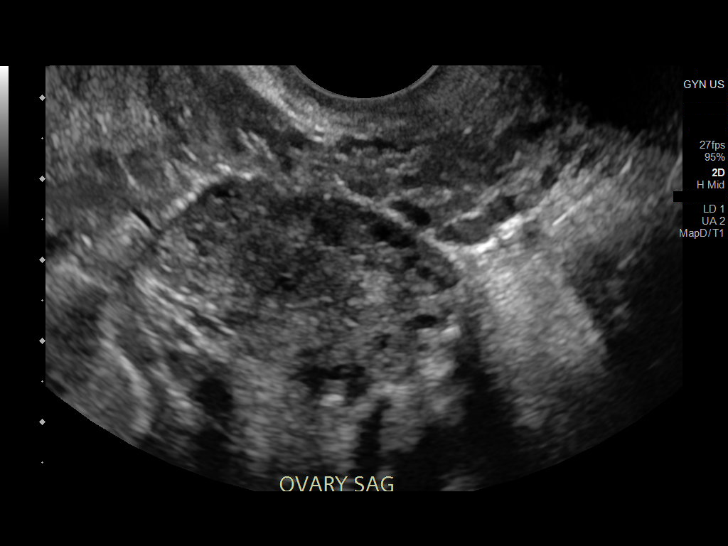

[14 of 28 positions shown; findings below may reference images not displayed]

FINDINGS: Intrauterine gestational sac: Single

Yolk sac:  Present

Embryo:  Present

Cardiac Activity: Present

Heart Rate: 160 bpm

CRL:   12.6 mm   7 w 3 d                  US EDC: 09/09/2022

Subchorionic hemorrhage:  None visualized.

Maternal uterus/adnexae: Left ovary within normal limits. 1.6 x
x 1.1 cm hemorrhagic corpus luteal cyst noted within the right
ovary. No other adnexal mass or free fluid.
IMPRESSION: 1. Single viable IUP, estimated gestational age 7 weeks and 3 days
by crown-rump length, with ultrasound EDC of 09/09/2022. No
complication.
2. 1.6 cm hemorrhagic right ovarian corpus luteal cyst.

## 2023-09-02 ENCOUNTER — Telehealth: Payer: Self-pay | Admitting: *Deleted

## 2023-09-02 NOTE — Telephone Encounter (Signed)
Mailbox not set up to leave a message.

## 2023-09-17 NOTE — L&D Delivery Note (Signed)
 Delivery Note Kimberly Lewis is a 28 y.o. G3P3003 at [redacted]w[redacted]d admitted for labor.   GBS Status:  Negative/-- (10/03 1018)  Labor course: Initial SVE: 5/80/-3. Augmentation with: nothing. She then progressed to complete.  ROM: 0h 62m with light mec fluid  Birth: After a brief 2nd stage, she delivered a Live born female  Birth Weight: 5 lb 6.8 oz (2460 g) APGAR: 9, 9  Newborn Delivery   Birth date/time: 07/02/2024 07:42:00 Delivery type: Vaginal, Spontaneous        Delivered via spontaneous vaginal delivery (Presentation: OA ). Nuchal cord present: No. . Shoulders and body delivered in usual fashion. Infant placed directly on mom's abdomen for bonding/skin-to-skin, baby dried and stimulated. Cord clamped x 2 after 1 minute and cut by FOB.  Cord blood collected. Placenta delivered-Spontaneous with 3 vessels. 20u Pitocin  in 500cc LR given as a bolus prior to delivery of placenta.  Fundus firm with massage. Placenta inspected and appears to be intact with a 3 VC.  Sponge and instrument count were correct x2.  Intrapartum complications:  None Anesthesia:  epidural Lacerations:  none Suture Repair: n/a EBL (mL):34.00    Mom to postpartum.  Baby to Couplet care / Skin to Skin. Placenta to L&D   Plans to Breastfeed Contraception: none Circumcision: N/A  Note sent to [facility]: K[facility] for pp visit.  Delivery Report:  Review the Delivery Report for details.     Signed: Cathlean Ely, DNP,CNM 07/02/2024, 8:58 AM

## 2023-11-24 ENCOUNTER — Telehealth: Payer: Self-pay | Admitting: *Deleted

## 2023-11-24 NOTE — Telephone Encounter (Signed)
 Returned call from 2:33 PM. Left patient a message to call the office back.

## 2023-12-05 ENCOUNTER — Telehealth: Payer: Self-pay | Admitting: *Deleted

## 2023-12-05 NOTE — Telephone Encounter (Signed)
 Left patient a message to call the office to reschedule New OB appointment 2 weeks out from scheduled appointment when she is 12 weeks, around 12/30/2023.

## 2023-12-09 ENCOUNTER — Encounter: Payer: Self-pay | Admitting: *Deleted

## 2023-12-09 DIAGNOSIS — Z3490 Encounter for supervision of normal pregnancy, unspecified, unspecified trimester: Secondary | ICD-10-CM | POA: Insufficient documentation

## 2023-12-17 ENCOUNTER — Ambulatory Visit: Admitting: Obstetrics and Gynecology

## 2023-12-23 NOTE — Progress Notes (Signed)
 Pt rescheduled appt.

## 2023-12-31 ENCOUNTER — Encounter: Payer: Self-pay | Admitting: Obstetrics and Gynecology

## 2023-12-31 ENCOUNTER — Ambulatory Visit: Admitting: Obstetrics and Gynecology

## 2023-12-31 VITALS — BP 111/72 | HR 72 | Wt 213.0 lb

## 2023-12-31 DIAGNOSIS — R87612 Low grade squamous intraepithelial lesion on cytologic smear of cervix (LGSIL): Secondary | ICD-10-CM

## 2023-12-31 DIAGNOSIS — Z8679 Personal history of other diseases of the circulatory system: Secondary | ICD-10-CM

## 2023-12-31 DIAGNOSIS — I4711 Inappropriate sinus tachycardia, so stated: Secondary | ICD-10-CM | POA: Diagnosis not present

## 2023-12-31 DIAGNOSIS — Z3A12 12 weeks gestation of pregnancy: Secondary | ICD-10-CM

## 2023-12-31 DIAGNOSIS — E282 Polycystic ovarian syndrome: Secondary | ICD-10-CM

## 2023-12-31 DIAGNOSIS — Z8759 Personal history of other complications of pregnancy, childbirth and the puerperium: Secondary | ICD-10-CM

## 2023-12-31 DIAGNOSIS — Z8659 Personal history of other mental and behavioral disorders: Secondary | ICD-10-CM

## 2023-12-31 DIAGNOSIS — Z3481 Encounter for supervision of other normal pregnancy, first trimester: Secondary | ICD-10-CM

## 2023-12-31 DIAGNOSIS — Z3482 Encounter for supervision of other normal pregnancy, second trimester: Secondary | ICD-10-CM

## 2023-12-31 DIAGNOSIS — Z6839 Body mass index (BMI) 39.0-39.9, adult: Secondary | ICD-10-CM

## 2023-12-31 MED ORDER — ASPIRIN 81 MG PO TBEC: 81.0000 mg | DELAYED_RELEASE_TABLET | Freq: Every day | ORAL | 12 refills | Status: DC

## 2023-12-31 MED ORDER — PRENATAL VITAMIN 27-0.8 MG PO TABS: 1.0000 | ORAL_TABLET | Freq: Every day | ORAL | 4 refills | Status: DC

## 2024-01-02 ENCOUNTER — Other Ambulatory Visit (HOSPITAL_COMMUNITY): Payer: Self-pay

## 2024-01-02 ENCOUNTER — Encounter (HOSPITAL_COMMUNITY): Payer: Self-pay | Admitting: Obstetrics and Gynecology

## 2024-01-02 ENCOUNTER — Inpatient Hospital Stay (HOSPITAL_COMMUNITY)
Admission: AD | Admit: 2024-01-02 | Discharge: 2024-01-02 | Disposition: A | Discharge status: Home / Self Care | Attending: Obstetrics and Gynecology | Admitting: Obstetrics and Gynecology

## 2024-01-02 DIAGNOSIS — Z3A12 12 weeks gestation of pregnancy: Secondary | ICD-10-CM

## 2024-01-02 DIAGNOSIS — O26891 Other specified pregnancy related conditions, first trimester: Secondary | ICD-10-CM

## 2024-01-02 DIAGNOSIS — O99611 Diseases of the digestive system complicating pregnancy, first trimester: Secondary | ICD-10-CM | POA: Insufficient documentation

## 2024-01-02 DIAGNOSIS — K521 Toxic gastroenteritis and colitis: Secondary | ICD-10-CM

## 2024-01-02 LAB — PREGNANCY, INITIAL SCREEN
Antibody Screen: NEGATIVE
Basophils Absolute: 0 10*3/uL (ref 0.0–0.2)
Basos: 0 %
Bilirubin, UA: NEGATIVE
Chlamydia trachomatis, NAA: NEGATIVE
EOS (ABSOLUTE): 0.1 10*3/uL (ref 0.0–0.4)
Eos: 2 %
Glucose, UA: NEGATIVE
HCV Ab: NONREACTIVE
HIV Screen 4th Generation wRfx: NONREACTIVE
Hematocrit: 37.1 % (ref 34.0–46.6)
Hemoglobin: 12.5 g/dL (ref 11.1–15.9)
Hepatitis B Surface Ag: NEGATIVE
Immature Grans (Abs): 0.1 10*3/uL (ref 0.0–0.1)
Immature Granulocytes: 1 %
Leukocytes,UA: NEGATIVE
Lymphocytes Absolute: 1.6 10*3/uL (ref 0.7–3.1)
Lymphs: 19 %
MCH: 29.2 pg (ref 26.6–33.0)
MCHC: 33.7 g/dL (ref 31.5–35.7)
MCV: 87 fL (ref 79–97)
Monocytes Absolute: 0.8 10*3/uL (ref 0.1–0.9)
Monocytes: 9 %
Neisseria Gonorrhoeae by PCR: NEGATIVE
Neutrophils Absolute: 5.8 10*3/uL (ref 1.4–7.0)
Neutrophils: 69 %
Nitrite, UA: NEGATIVE
Platelets: 291 10*3/uL (ref 150–450)
Protein,UA: NEGATIVE
RBC, UA: NEGATIVE
RBC: 4.28 x10E6/uL (ref 3.77–5.28)
RDW: 12.9 % (ref 11.7–15.4)
RPR Ser Ql: NONREACTIVE
Rh Factor: POSITIVE
Rubella Antibodies, IGG: 8.8 {index} (ref >0.99)
Specific Gravity, UA: 1.026 (ref 1.005–1.030)
Urobilinogen, Ur: 0.2 mg/dL (ref 0.2–1.0)
WBC: 8.4 10*3/uL (ref 3.4–10.8)
pH, UA: 6 (ref 5.0–7.5)

## 2024-01-02 LAB — MICROSCOPIC EXAMINATION
Bacteria, UA: NONE SEEN
Casts: NONE SEEN /LPF
RBC, Urine: NONE SEEN /HPF (ref 0–2)
WBC, UA: NONE SEEN /HPF (ref 0–5)

## 2024-01-02 LAB — URINALYSIS, ROUTINE W REFLEX MICROSCOPIC
Bilirubin Urine: NEGATIVE
Glucose, UA: NEGATIVE mg/dL
Hgb urine dipstick: NEGATIVE
Ketones, ur: 5 mg/dL — AB
Leukocytes,Ua: NEGATIVE
Nitrite: NEGATIVE
Protein, ur: NEGATIVE mg/dL
Specific Gravity, Urine: 1.028 (ref 1.005–1.030)
pH: 5 (ref 5.0–8.0)

## 2024-01-02 LAB — HCV INTERPRETATION

## 2024-01-02 LAB — URINE CULTURE, OB REFLEX

## 2024-01-02 MED ORDER — SODIUM CHLORIDE 0.9 % IV SOLN
8.0000 mg | Freq: Once | INTRAVENOUS | Status: AC
  Administered 2024-01-02: 8 mg via INTRAVENOUS
  Filled 2024-01-02: qty 4

## 2024-01-02 MED ORDER — SODIUM CHLORIDE 0.9 % IV SOLN: 25.0000 mg | Freq: Once | INTRAVENOUS | Status: DC

## 2024-01-02 MED ORDER — ONDANSETRON 4 MG PO TBDP
4.0000 mg | ORAL_TABLET | Freq: Three times a day (TID) | ORAL | 0 refills | Status: DC | PRN
  Filled 2024-01-02: qty 30, 10d supply, fill #0

## 2024-01-02 MED ORDER — LACTATED RINGERS IV SOLN
25.0000 mg | Freq: Once | INTRAVENOUS | Status: AC
  Administered 2024-01-02: 25 mg via INTRAVENOUS
  Filled 2024-01-02: qty 1

## 2024-01-02 MED ORDER — SCOPOLAMINE 1 MG/3DAYS TD PT72
1.0000 | MEDICATED_PATCH | Freq: Once | TRANSDERMAL | Status: DC
  Administered 2024-01-02: 1.5 mg via TRANSDERMAL
  Filled 2024-01-02: qty 1

## 2024-01-02 NOTE — MAU Provider Note (Cosign Needed Addendum)
 History     CSN: [id number]  Arrival date and time: 01/02/24 1227   Event Date/Time   First Provider Initiated Contact with Patient 01/02/24 1321      Chief Complaint  Patient presents with   Abdominal Pain   Emesis   Nausea   Diarrhea   HPI Ms. Vieva Brummitt is a 28 y.o. year old G45P2002 female at [redacted]w[redacted]d weeks gestation who presents to MAU reporting not being able to keep anything down for the past 5 days and abdominal cramping while eating (rated 5/10). She reports the N/V started Monday after she and her mom ate at [employer] in [place], [place]. She thinks she "may have eaten some bad food." She reports her mom "is fine; she never gets sick by anything."  She reports 3-4 episodes of emesis today and 1 episode of diarrhea. She reports the nausea is constant all day. She has not taken any medications for the N/V. She last ate "real food" yesterday @ 0830 and crackers this morning at 0630. She last drank "two sips of gingerale" just before getting to MAU. She denies VB, abnormal vaginal discharge or LOF. She reports she weighed 213 lbs at her last OB appointment on Wednesday 12/30/2023 and she weighs 206 lbs today. She receives [facility] with [facility]; next appt is 01/28/2024.   OB History     Gravida  3   Para  2   Term  2   Preterm  0   AB  0   Living  2      SAB  0   IAB  0   Ectopic  0   Multiple  0   Live Births  2           Past Medical History:  Diagnosis Date   Inappropriate sinus tachycardia (HCC)    Tachycardia    was taken off medication due to pregnancy-naldolol   Trichomonas infection 2017    Past Surgical History:  Procedure Laterality Date   CHOLECYSTECTOMY     WISDOM TOOTH EXTRACTION Bilateral     Family History  Problem Relation Age of Onset   Hypertension Maternal Grandmother    Cancer Maternal Grandfather    Cancer Paternal Grandmother    Diabetes Other    Asthma Neg Hx    Heart disease Neg Hx    Stroke Neg Hx      Social History   Tobacco Use   Smoking status: Former    Types: Cigarettes    Passive exposure: Past  Vaping Use   Vaping status: Never Used  Substance Use Topics   Alcohol use: Not Currently   Drug use: Not Currently    Allergies:  Allergies  Allergen Reactions   Aspirin  Nausea And Vomiting    abd pain and vomiting abd pain and vomiting    Amoxicillin Itching and Swelling    Per patient, caused vaginal itching and swelling    Medications Prior to Admission  Medication Sig Dispense Refill Last Dose/Taking   Prenatal Vit-Fe Fumarate-FA (PRENATAL VITAMIN) 27-0.8 MG TABS Take 1 tablet by mouth daily. 90 tablet 4 01/02/2024   aspirin  EC 81 MG tablet Take 1 tablet (81 mg total) by mouth daily. Swallow whole. 30 tablet 12    metoCLOPramide  (REGLAN ) 10 MG tablet 10 mg three times daily for 30 days, 10 mg twice daily for 4 days and 10 mg daily for 4 days 102 tablet 0     Review of Systems  Constitutional:  Positive  for appetite change.  HENT: Negative.    Eyes: Negative.   Respiratory: Negative.    Cardiovascular: Negative.   Gastrointestinal:  Positive for diarrhea, nausea and vomiting. Negative for abdominal pain (cramping while eating).  Endocrine: Negative.   Genitourinary: Negative.   Musculoskeletal: Negative.   Skin: Negative.   Allergic/Immunologic: Negative.   Neurological: Negative.   Hematological: Negative.   Psychiatric/Behavioral: Negative.     Physical Exam   Blood pressure 120/68, pulse 69, temperature 98.6 F (37 C), temperature source Oral, resp. rate 16, height 5\' 1"  (1.549 m), weight 94.6 kg, last menstrual period 10/06/2023, SpO2 100%, currently breastfeeding.  Physical Exam Vitals and nursing note reviewed.  Constitutional:      Appearance: Normal appearance. She is obese.  Cardiovascular:     Rate and Rhythm: Normal rate.  Pulmonary:     Effort: Pulmonary effort is normal.  Genitourinary:    Comments: Not indicated Musculoskeletal:         General: Normal range of motion.  Neurological:     Mental Status: She is alert and oriented to person, place, and time.  Psychiatric:        Mood and Affect: Mood normal.        Behavior: Behavior normal.        Thought Content: Thought content normal.        Judgment: Judgment normal.    FHTs by doppler: 156 bpm  MAU Course  Procedures  MDM CCUA IVFs: Phenergan  25 mg in LR 1000 ml @ 999 ml/hr  -- no nausea/vomiting Zofran  8 mg IVPB  -- no nausea/vomiting  Scopolamine  Patch x 1 PO Challenge -- patient tolerated well    Results for orders placed or performed during the hospital encounter of 01/02/24 (from the past 24 hours)  Urinalysis, Routine w reflex microscopic -Urine, Clean Catch     Status: Abnormal   Collection Time: 01/02/24  1:15 PM  Result Value Ref Range   Color, Urine AMBER (A) YELLOW   APPearance HAZY (A) CLEAR   Specific Gravity, Urine 1.028 1.005 - 1.030   pH 5.0 5.0 - 8.0   Glucose, UA NEGATIVE NEGATIVE mg/dL   Hgb urine dipstick NEGATIVE NEGATIVE   Bilirubin Urine NEGATIVE NEGATIVE   Ketones, ur 5 (A) NEGATIVE mg/dL   Protein, ur NEGATIVE NEGATIVE mg/dL   Nitrite NEGATIVE NEGATIVE   Leukocytes,Ua NEGATIVE NEGATIVE    Assessment and Plan  1. Gastroenteritis due to food toxin (Primary) - Information provided on bland diet - Advised to continue with Reglan  and Zofran  prn. - Advised to exercise caution when removing Scope patch in 3 days. Advised to use a tissue or gloves on hands while removing it and to wash hands and under nails well    2. [redacted] weeks gestation of pregnancy   - Discharge home  - Keep scheduled appt with [facility] on 01/28/2024 - Patient verbalized an understanding of the plan of care and agrees.   Lanell Carpenter, CNM 01/02/2024, 1:21 PM

## 2024-01-02 NOTE — MAU Note (Signed)
 MAU Triage Note:  .Kimberly Lewis is a 28 y.o. at [redacted]w[redacted]d here in MAU reporting: has not been able to keep anything down for the past 5 days. Reports abdominal cramping when eating. Reports 3-4 episodes of emesis today and 1 episode of diarrhea today. Nausea is constant. Has not taken any medications. She reports intermittent abdominal cramping 5/10. Denies VB or LOF. Reports that she was 213 lbs on Tuesday and she is now 206 lbs.   Pain Score: 5  Pain Location: Abdomen     LMP: Patient's last menstrual period was 10/06/2023. Onset of complaint: 5 days  Vitals:   01/02/24 1244  BP: 120/68  Pulse: 69  Resp: 16  Temp: 98.6 F (37 C)  SpO2: 100%    FHT:  Fetal Heart Rate Mode: Doppler Baseline Rate (A): 156 bpm Multiple birth?: No Lab orders placed from triage: UA

## 2024-01-03 ENCOUNTER — Encounter: Payer: Self-pay | Admitting: Obstetrics and Gynecology

## 2024-01-05 ENCOUNTER — Encounter: Payer: Self-pay | Admitting: Obstetrics and Gynecology

## 2024-01-05 DIAGNOSIS — Z6839 Body mass index (BMI) 39.0-39.9, adult: Secondary | ICD-10-CM | POA: Insufficient documentation

## 2024-01-05 LAB — HGB A1C W/O EAG: Hgb A1c MFr Bld: 5.5 % (ref 4.8–5.6)

## 2024-01-05 LAB — SPECIMEN STATUS REPORT

## 2024-01-05 NOTE — Progress Notes (Signed)
 Subjective:   Brittnay Pigman is a 28 y.o. G3P2002 at [redacted]w[redacted]d by LMP c/w 6wk US  being seen today for her first obstetrical visit.  Her obstetrical history is significant for obesity and pregnancy induced hypertension. Pregnancy history fully reviewed. Term SVD x2, birth weights in chart.   Patient reports  doing well overall .  HISTORY: OB History  Gravida Para Term Preterm AB Living  3 2 2  0 0 2  SAB IAB Ectopic Multiple Live Births  0 0 0 0 2    # Outcome Date GA Lbr Len/2nd Weight Sex Type Anes PTL Lv  3 Current           2 Term 08/26/22 [redacted]w[redacted]d 10:12 / 02:49 8 lb 13.1 oz (4 kg) M Vag-Spont EPI  LIV     Name: VAN-JONES,BOY Christian     Apgar1: 2  Apgar5: 6  1 Term 03/26/12   7 lb 1 oz (3.204 kg) M Vag-Spont   LIV     Last pap smear: Lab Results  Component Value Date   DIAGPAP  02/18/2022    - Negative for intraepithelial lesion or malignancy (NILM)    Past Medical History:  Diagnosis Date   Depression    Fibroids    Inappropriate sinus tachycardia (HCC)    PCOS (polycystic ovarian syndrome)    Posttraumatic stress disorder 12/14/2015   Trichomonas infection 2017   Past Surgical History:  Procedure Laterality Date   CHOLECYSTECTOMY     WISDOM TOOTH EXTRACTION Bilateral    Family History  Problem Relation Age of Onset   Hypertension Maternal Grandmother    Cancer Maternal Grandfather    Cancer Paternal Grandmother    Diabetes Other    Asthma Neg Hx    Heart disease Neg Hx    Stroke Neg Hx    Social History   Tobacco Use   Smoking status: Former    Types: Cigarettes    Passive exposure: Past  Vaping Use   Vaping status: Never Used  Substance Use Topics   Alcohol use: Not Currently   Drug use: Not Currently   Allergies  Allergen Reactions   Aspirin  Nausea And Vomiting    abd pain and vomiting abd pain and vomiting    Amoxicillin Itching and Swelling    Per patient, caused vaginal itching and swelling   Current Outpatient Medications on File  Prior to Visit  Medication Sig Dispense Refill   metoCLOPramide  (REGLAN ) 10 MG tablet 10 mg three times daily for 30 days, 10 mg twice daily for 4 days and 10 mg daily for 4 days 102 tablet 0   No current facility-administered medications on file prior to visit.     Exam   Vitals:   12/31/23 0839  BP: 111/72  Pulse: 72  Weight: 213 lb (96.6 kg)   Fetal Heart Rate (bpm): 145  General:  Alert, oriented and cooperative. Patient is in no acute distress.  Breast: Deferred  Cardiovascular: Normal heart rate noted  Respiratory: Normal respiratory effort, no problems with respiration noted   Assessment:   Pregnancy: [id number] Patient Active Problem List   Diagnosis Date Noted   BMI 39.0-39.9,adult 01/05/2024   Supervision of normal pregnancy 12/09/2023   History of postpartum hypertension 08/27/2022   Inappropriate sinus tachycardia (HCC) 02/04/2022   Papanicolaou smear of cervix with low grade squamous intraepithelial lesion (LGSIL) 07/05/2020    Plan:  1. Encounter for supervision of other normal pregnancy in first trimester (Primary) 2. [redacted]  weeks gestation of pregnancy Initial labs drawn. Continue prenatal vitamins. Genetic Screening discussed: NIPS ordered. AFP next visit. Carrier screening negative in prior pregnancy Ultrasound discussed; fetal anatomic survey: ordered. Problem list reviewed and updated. The nature of [facility] with multiple MDs and other Advanced Practice Providers was explained to patient; also emphasized that residents, students are part of our team. Routine obstetric precautions reviewed. - Pregnancy, Initial Screen - Hemoglobin A1c - Enroll Patient in PreNatal Babyscripts - Prenatal Vit-Fe Fumarate-FA (PRENATAL VITAMIN) 27-0.8 MG TABS; Take 1 tablet by mouth daily.  Dispense: 90 tablet; Refill: 4 - aspirin  EC 81 MG tablet; Take 1 tablet (81 mg total) by mouth daily. Swallow whole.  Dispense: 30 tablet; Refill: 12 -  US  MFM OB DETAIL +14 WK; Future - PANORAMA PRENATAL TEST - Interpretation: - Microscopic Examination - Urine Culture, OB Reflex  3. Inappropriate sinus tachycardia (HCC) No current medications, HR normal today  4. Papanicolaou smear of cervix with low grade squamous intraepithelial lesion (LGSIL) NILM/HPV neg 2023  5. History of postpartum hypertension Baseline CMP/PCR Reviewed elevated risk of preeclampsia this pregnancy Discussed ldASA - has had n/v with aspirin  in past. Will try low dose and see if she can tolerate it  6. PCOS (polycystic ovarian syndrome) 7. BMI 39 A1c ordered  8. History of depression/PTSD Noted on chart review, no mood complaints during visit  Return in about 4 weeks (around 01/28/2024) for return OB at 16 weeks.  Loralyn Rochester, MD Obstetrician & Gynecologist, [facility] for [employer], [facility]

## 2024-01-06 ENCOUNTER — Encounter: Payer: Self-pay | Admitting: Obstetrics and Gynecology

## 2024-01-17 ENCOUNTER — Encounter: Payer: Self-pay | Admitting: Obstetrics and Gynecology

## 2024-01-17 LAB — PANORAMA PRENATAL TEST FULL PANEL:PANORAMA TEST PLUS 5 ADDITIONAL MICRODELETIONS: FETAL FRACTION: 4.7

## 2024-01-26 ENCOUNTER — Inpatient Hospital Stay (HOSPITAL_COMMUNITY)
Admission: AD | Admit: 2024-01-26 | Discharge: 2024-01-26 | Disposition: A | Discharge status: Home / Self Care | Attending: Obstetrics and Gynecology | Admitting: Obstetrics and Gynecology

## 2024-01-26 ENCOUNTER — Encounter (HOSPITAL_COMMUNITY): Payer: Self-pay | Admitting: Family Medicine

## 2024-01-26 DIAGNOSIS — Z3A16 16 weeks gestation of pregnancy: Secondary | ICD-10-CM

## 2024-01-26 DIAGNOSIS — R1084 Generalized abdominal pain: Secondary | ICD-10-CM | POA: Diagnosis not present

## 2024-01-26 DIAGNOSIS — Z3492 Encounter for supervision of normal pregnancy, unspecified, second trimester: Secondary | ICD-10-CM

## 2024-01-26 DIAGNOSIS — N949 Unspecified condition associated with female genital organs and menstrual cycle: Secondary | ICD-10-CM | POA: Insufficient documentation

## 2024-01-26 DIAGNOSIS — O26892 Other specified pregnancy related conditions, second trimester: Secondary | ICD-10-CM | POA: Diagnosis not present

## 2024-01-26 DIAGNOSIS — M7918 Myalgia, other site: Secondary | ICD-10-CM

## 2024-01-26 DIAGNOSIS — R519 Headache, unspecified: Secondary | ICD-10-CM | POA: Diagnosis present

## 2024-01-26 DIAGNOSIS — R1030 Lower abdominal pain, unspecified: Secondary | ICD-10-CM | POA: Diagnosis present

## 2024-01-26 HISTORY — DX: Urinary tract infection, site not specified: N39.0

## 2024-01-26 LAB — URINALYSIS, ROUTINE W REFLEX MICROSCOPIC
Bilirubin Urine: NEGATIVE
Glucose, UA: NEGATIVE mg/dL
Hgb urine dipstick: NEGATIVE
Ketones, ur: NEGATIVE mg/dL
Leukocytes,Ua: NEGATIVE
Nitrite: NEGATIVE
Protein, ur: NEGATIVE mg/dL
Specific Gravity, Urine: 1.023 (ref 1.005–1.030)
pH: 5 (ref 5.0–8.0)

## 2024-01-26 MED ORDER — CYCLOBENZAPRINE HCL 5 MG PO TABS: 5.0000 mg | ORAL_TABLET | Freq: Three times a day (TID) | ORAL | 0 refills | Status: DC | PRN

## 2024-01-26 MED ORDER — CYCLOBENZAPRINE HCL 5 MG PO TABS
10.0000 mg | ORAL_TABLET | Freq: Once | ORAL | Status: AC
  Administered 2024-01-26: 10 mg via ORAL
  Filled 2024-01-26: qty 2

## 2024-01-26 MED ORDER — ACETAMINOPHEN 500 MG PO TABS
1000.0000 mg | ORAL_TABLET | Freq: Once | ORAL | Status: DC
  Filled 2024-01-26: qty 2

## 2024-01-26 MED ORDER — PREPLUS 27-1 MG PO TABS: 1.0000 | ORAL_TABLET | Freq: Every day | ORAL | 13 refills | Status: AC
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
## 2024-01-26 NOTE — Discharge Instructions (Signed)
 PREGNANCY SUPPORT BELT: 75% of pregnant people have some sort of abdominal or back pain at some point in their pregnancy. Your body is adjusting to carrying a growing belly which pulls on the muscles in your back and can destabilize your pelvis and hips. This pain can be eased with daily stretching and movements, but also by wearing a pregnancy support belt. A support belt will help relieve back pain, abdominal, pelvic and leg pain, stabilize the pelvis and relieve pressure on the sciatic nerves. The best way to put it on is when laying down. Make sure it is low enough to wrap it around the underside of your belly and then stand up and attach the top portion. Wear this daily as your belly grows for the best relief of pain.  WHERE TO GET YOUR PREGNANCY BELT:  [facility] [address] [place], [place] [address] [phone]  [facility] [address] [place], [place] [address] [phone]

## 2024-01-26 NOTE — MAU Note (Addendum)
 Kimberly Lewis is a 28 y.o. at [redacted]w[redacted]d here in MAU reporting: having some stomach pains, lower abd. Was kicked in the stomach by her 1y/o son.   For the past 2 wks has been having these HA and been light headed.  Denies vag bleeding or d/c. Little nausea, denies vomiting, diarrhea or constipation. No urinary problems.  Has not taken anything for pain or HA.  Onset of complaint: 2 wks Pain score: abd 7, HA 7 Vitals:   01/26/24 1851  Resp: 17  Temp: 98.5 F (36.9 C)  SpO2: 100%     FHT:145 Lab orders placed from triage:  urine   Orthostatic vs done in triage

## 2024-01-28 ENCOUNTER — Ambulatory Visit (INDEPENDENT_AMBULATORY_CARE_PROVIDER_SITE_OTHER): Admitting: Obstetrics and Gynecology

## 2024-01-28 ENCOUNTER — Encounter: Payer: Self-pay | Admitting: Obstetrics and Gynecology

## 2024-01-28 VITALS — BP 116/73 | HR 78 | Wt 208.4 lb

## 2024-01-28 DIAGNOSIS — Z8759 Personal history of other complications of pregnancy, childbirth and the puerperium: Secondary | ICD-10-CM | POA: Diagnosis not present

## 2024-01-28 DIAGNOSIS — I4711 Inappropriate sinus tachycardia, so stated: Secondary | ICD-10-CM | POA: Diagnosis not present

## 2024-01-28 DIAGNOSIS — Z8679 Personal history of other diseases of the circulatory system: Secondary | ICD-10-CM | POA: Diagnosis not present

## 2024-01-28 DIAGNOSIS — Z3482 Encounter for supervision of other normal pregnancy, second trimester: Secondary | ICD-10-CM

## 2024-01-28 MED ORDER — FAMOTIDINE 20 MG PO TABS: 20.0000 mg | ORAL_TABLET | Freq: Two times a day (BID) | ORAL | 2 refills | Status: AC

## 2024-01-28 NOTE — Progress Notes (Signed)
   PRENATAL VISIT NOTE  Subjective:  Kimberly Lewis is a 28 y.o. G3P2002 at [redacted]w[redacted]d being seen today for ongoing prenatal care.  She is currently monitored for the following issues for this low-risk pregnancy and has Inappropriate sinus tachycardia (HCC); Papanicolaou smear of cervix with low grade squamous intraepithelial lesion (LGSIL); History of postpartum hypertension; Supervision of normal pregnancy; and BMI 39.0-39.9,adult on their problem list.  Patient reports headache, nausea, and dizzy spells.  Movement: Present. Denies leaking of fluid.   The following portions of the patient's history were reviewed and updated as appropriate: allergies, current medications, past family history, past medical history, past social history, past surgical history and problem list.   Objective:    Vitals:   01/28/24 0903  BP: 116/73  Pulse: 78  Weight: 94.5 kg    Fetal Status:      Movement: Present    General: Alert, oriented and cooperative. Patient is in no acute distress.  Skin: Skin is warm and dry. No rash noted.   Cardiovascular: Normal heart rate noted  Respiratory: Normal respiratory effort, no problems with respiration noted  Abdomen: Soft, gravid, appropriate for gestational age.        Pelvic: Cervical exam deferred        Extremities: Normal range of motion.  Edema: None  Mental Status: Normal mood and affect. Normal behavior. Normal judgment and thought content.   Assessment and Plan:  Pregnancy: G3P2002 at [redacted]w[redacted]d 1. Encounter for supervision of other normal pregnancy in second trimester (Primary) -Still feeling pretty nauseous/queasy with a lack of appetite. Struggles to eat and only eats about one small meal a day. Has some nausea meds from previous MAU visit but not taking them. Also having heartburn. Rx for Pepcid  & discussed dietary recommendations/strategies. Having headaches with dizzy spells- no heart palpations/racing. Normal BP & has cuff at home. Discussed likely  related to lack of intake but recommended checking BP at home when this happens & good fluid intake. -having round ligament pain, has flexeril  to use prn. Discussed gentle stretching & slow movements as well as maternity support belt in the future. Could consider PT if she desires   2. History of postpartum hypertension -Not currently taking ASA. Discussed recommended guidelines. Has hx of aspirin  causing GI upset. Discussed importance of taking with food and to trial low dose to see if tolerable  3. Inappropriate sinus tachycardia (HCC) -Saw Cardiology last pregnancy in 2023 & had no issues  -Feels good & hasn't noticed heart rhythm changes in years, will reach out if she notices anything -Was told it was likely related to her high caffeine intake which she now avoids   Preterm labor symptoms and general obstetric precautions including but not limited to vaginal bleeding, contractions, leaking of fluid and fetal movement were reviewed in detail with the patient. Please refer to After Visit Summary for other counseling recommendations.   Return in about 4 weeks (around 02/25/2024) for OB VISIT, MD or APP, Schedule US  with MFM.  Future Appointments  Date Time Provider Department Center  02/23/2024 10:00 AM [facility] PROVIDER 1 [facility]  02/23/2024 10:30 AM [facility] US1 [facility]    Doria Garden, [facility] 01/28/24

## 2024-01-30 LAB — AFP, SERUM, OPEN SPINA BIFIDA
AFP MoM: 1.24
AFP Value: 38.1 ng/mL
Gest. Age on Collection Date: 16.3 wk
Maternal Age At EDD: 29.7 a
OSBR Risk 1 IN: 10000
Test Results:: NEGATIVE
Weight: 211 [lb_av]

## 2024-01-31 ENCOUNTER — Ambulatory Visit: Payer: Self-pay | Admitting: Obstetrics and Gynecology

## 2024-02-16 DIAGNOSIS — O99210 Obesity complicating pregnancy, unspecified trimester: Secondary | ICD-10-CM | POA: Insufficient documentation

## 2024-02-23 ENCOUNTER — Other Ambulatory Visit: Payer: Self-pay | Admitting: *Deleted

## 2024-02-23 ENCOUNTER — Ambulatory Visit: Attending: Obstetrics and Gynecology

## 2024-02-23 ENCOUNTER — Ambulatory Visit: Admitting: Maternal & Fetal Medicine

## 2024-02-23 VITALS — BP 117/52 | HR 75

## 2024-02-23 DIAGNOSIS — O99210 Obesity complicating pregnancy, unspecified trimester: Secondary | ICD-10-CM | POA: Insufficient documentation

## 2024-02-23 DIAGNOSIS — Z3482 Encounter for supervision of other normal pregnancy, second trimester: Secondary | ICD-10-CM | POA: Diagnosis present

## 2024-02-23 DIAGNOSIS — Z3A20 20 weeks gestation of pregnancy: Secondary | ICD-10-CM | POA: Diagnosis not present

## 2024-02-23 DIAGNOSIS — Z363 Encounter for antenatal screening for malformations: Secondary | ICD-10-CM | POA: Insufficient documentation

## 2024-02-23 DIAGNOSIS — Z3481 Encounter for supervision of other normal pregnancy, first trimester: Secondary | ICD-10-CM | POA: Insufficient documentation

## 2024-02-23 DIAGNOSIS — E669 Obesity, unspecified: Secondary | ICD-10-CM

## 2024-02-23 DIAGNOSIS — O99212 Obesity complicating pregnancy, second trimester: Secondary | ICD-10-CM | POA: Insufficient documentation

## 2024-02-23 DIAGNOSIS — Z3689 Encounter for other specified antenatal screening: Secondary | ICD-10-CM

## 2024-02-23 NOTE — Progress Notes (Signed)
 Patient information  Patient Name: Kimberly Lewis  Patient MRN:   [id number]  Referring practice: MFM Referring Provider: [facility]  Problem List   Patient Active Problem List   Diagnosis Date Noted   Obesity affecting pregnancy, antepartum 02/16/2024   BMI 39.0-39.9,adult 01/05/2024   Supervision of normal pregnancy 12/09/2023   History of postpartum hypertension 08/27/2022   Inappropriate sinus tachycardia (HCC) 02/04/2022   Papanicolaou smear of cervix with low grade squamous intraepithelial lesion (LGSIL) 07/05/2020    Maternal Fetal Medicine Consult Kimberly Lewis is a 28 y.o. G3P2002 at [redacted]w[redacted]d here for ultrasound and consultation. She had Low risk of a Female. Carrier screening was Negative for the basic screening. AFP was Negative. She has no acute concerns. Today we focused on the following:   Elevated BMI (pregravid ~40):  I discussed the potential complications associated with elevated BMI in pregnancy.  These complications include but are not limited to increased risk of excessive maternal weight gain, fetal growth abnormalities, fetal congenital disorders, inability to visualize fetal anatomic structures on ultrasound, gestational diabetes, hypertensive disorders of pregnancy, operative birth including cesarean delivery or assisted vaginal delivery, delayed wound healing and many long-term health complications.  I discussed the need for continued growth ultrasounds and possibly antenatal testing depending upon how the pregnancy course progresses.  Maternal weight gain should be limited to 10 to 20 pounds during the pregnancy.  While normal weight loss may occur during the first and early second trimester, efforts to actively lose weight with the use of medication is not recommended during pregnancy.  A whole food diet and regular exercise of at least 15 to 30 minutes of moderately strenuous activity is recommended in the absence of any contraindications.  Weight loss with the use of medications is not recommended during pregnancy. I discussed the risk and impact of preeclampsia on her pregnancy and the role of baseline laboratory assessment of kidney, liver and platelet count as well as the role of low dose Aspirin  to the reduce the risk of developing preeclampsia. I reassured the patient that we expect a favorable pregnancy outcome but due to her pregnancy complications she will need a higher level of monitoring for her pregnancy compared to a pregnancy without complications. The patient had time to ask questions that were answered to her satisfaction. She verbalized understanding of our discussion and request to proceed with the plan outlined in the recommendations.   Sonographic findings Single intrauterine pregnancy at 20w 0d  Fetal cardiac activity:  Observed and appears normal. Presentation: Cephalic. The anatomic structures that were well seen appear normal without evidence of soft markers. Due to poor acoustic windows some structures remain suboptimally visualized. Fetal biometry shows the estimated fetal weight at the 44 percentile.  Amniotic fluid: Within normal limits.  MVP: 5.45 cm. Placenta: Fundal. Adnexa: No abnormality visualized. Cervical length: 4.5 cm.  There are limitations of prenatal ultrasound such as the inability to detect certain abnormalities due to poor visualization. Various factors such as fetal position, gestational age and maternal body habitus may increase the difficulty in visualizing the fetal anatomy.    Recommendations - EDD should be 07/12/2024 based on  LMP  (10/06/23). - Aspirin  81-162 mg from 12 weeks and continued throughout the pregnancy for preeclampsia prophylaxis. - Baseline labs: CMP, CBC, urine protein creatinine ratio. - Blood pressure goal of < 140 systolic and < 90 diastolic. Antihypertensive medication should be added/adjusted until BP goal is achieved.  - Early Glucola screening (or A1c  of  patient declines). - Serial growth ultrasounds every 4 weeks starting at 24 weeks until delivery. - Antenatal testing (usually weekly BPP or NST) weekly at 34 weeks until delivery. - Delivery likely around 39-[redacted] weeks gestation or sooner if indicated.  Review of Systems: A review of systems was performed and was negative except per HPI   Past Obstetrical History:  OB History  Gravida Para Term Preterm AB Living  3 2 2  0 0 2  SAB IAB Ectopic Multiple Live Births  0 0 0 0 2    # Outcome Date GA Lbr Len/2nd Weight Sex Type Anes PTL Lv  3 Current           2 Term 08/26/22 [redacted]w[redacted]d 10:12 / 02:49 8 lb 13.1 oz (4 kg) M Vag-Spont EPI  LIV  1 Term 03/26/12   7 lb 1 oz (3.204 kg) M Vag-Spont   LIV    Obstetric Comments  2023 PP HTN     Past Medical History:  Past Medical History:  Diagnosis Date   Depression    Fibroids    Inappropriate sinus tachycardia (HCC)    PCOS (polycystic ovarian syndrome)    Posttraumatic stress disorder 12/14/2015   Trichomonas infection 2017   UTI (urinary tract infection)      Past Surgical History:    Past Surgical History:  Procedure Laterality Date   CHOLECYSTECTOMY     WISDOM TOOTH EXTRACTION Bilateral      Home Medications:   Current Outpatient Medications on File Prior to Visit  Medication Sig Dispense Refill   famotidine  (PEPCID ) 20 MG tablet Take 1 tablet (20 mg total) by mouth 2 (two) times daily. 60 tablet 2   Prenatal Vit-Fe Fumarate-FA (PREPLUS) 27-1 MG TABS Take 1 tablet by mouth daily. 30 tablet 13   aspirin  EC 81 MG tablet Take 1 tablet (81 mg total) by mouth daily. Swallow whole. (Patient not taking: Reported on 01/28/2024) 30 tablet 12   cyclobenzaprine  (FLEXERIL ) 5 MG tablet Take 1 tablet (5 mg total) by mouth 3 (three) times daily as needed. (Patient not taking: Reported on 01/28/2024) 20 tablet 0   metoCLOPramide  (REGLAN ) 10 MG tablet 10 mg three times daily for 30 days, 10 mg twice daily for 4 days and 10 mg daily for 4 days  (Patient not taking: Reported on 01/28/2024) 102 tablet 0   ondansetron  (ZOFRAN -ODT) 4 MG disintegrating tablet Take 1 tablet (4 mg total) by mouth every 8 (eight) hours as needed. (Patient not taking: Reported on 01/28/2024) 30 tablet 0   No current facility-administered medications on file prior to visit.      Allergies:   Allergies  Allergen Reactions   Aspirin  Nausea And Vomiting    abd pain and vomiting abd pain and vomiting    Amoxicillin Itching and Swelling    Per patient, caused vaginal itching and swelling     Physical Exam:   Vitals:   02/23/24 1003  BP: (!) 117/52  Pulse: 75   Sitting comfortably on the sonogram table Nonlabored breathing Normal rate and rhythm Abdomen is nontender  Thank you for the opportunity to be involved with this patient's care. Please let us  know if we can be of any further assistance.   45 minutes of time was spent reviewing the patient's chart including labs, imaging and documentation.  At least 50% of this time was spent with direct patient care discussing the diagnosis, management and prognosis of her care.  Shannon Darter MFM, [place]  02/23/2024  11:13 AM

## 2024-02-24 ENCOUNTER — Ambulatory Visit: Payer: Self-pay | Admitting: Obstetrics and Gynecology

## 2024-02-25 ENCOUNTER — Ambulatory Visit (INDEPENDENT_AMBULATORY_CARE_PROVIDER_SITE_OTHER): Admitting: Obstetrics and Gynecology

## 2024-02-25 VITALS — BP 105/70 | HR 81 | Wt 219.0 lb

## 2024-02-25 DIAGNOSIS — Z8679 Personal history of other diseases of the circulatory system: Secondary | ICD-10-CM | POA: Diagnosis not present

## 2024-02-25 DIAGNOSIS — Z8759 Personal history of other complications of pregnancy, childbirth and the puerperium: Secondary | ICD-10-CM

## 2024-02-25 DIAGNOSIS — Z3A20 20 weeks gestation of pregnancy: Secondary | ICD-10-CM | POA: Diagnosis not present

## 2024-02-25 DIAGNOSIS — Z3482 Encounter for supervision of other normal pregnancy, second trimester: Secondary | ICD-10-CM

## 2024-02-25 DIAGNOSIS — O99210 Obesity complicating pregnancy, unspecified trimester: Secondary | ICD-10-CM

## 2024-02-25 NOTE — Progress Notes (Signed)
   PRENATAL VISIT NOTE  Subjective:  Kimberly Lewis is a 28 y.o. G3P2002 at [redacted]w[redacted]d being seen today for ongoing prenatal care.  She is currently monitored for the following issues for this low-risk pregnancy and has Inappropriate sinus tachycardia (HCC); Papanicolaou smear of cervix with low grade squamous intraepithelial lesion (LGSIL); History of postpartum hypertension; Supervision of normal pregnancy; BMI 39.0-39.9,adult; and Obesity affecting pregnancy, antepartum on their problem list.  Patient reports no complaints.  Contractions: Not present. Vag. Bleeding: None.  Movement: Present. Denies leaking of fluid.   The following portions of the patient's history were reviewed and updated as appropriate: allergies, current medications, past family history, past medical history, past social history, past surgical history and problem list.   Objective:    Vitals:   02/25/24 0958  BP: 105/70  Pulse: 81  Weight: 219 lb (99.3 kg)    Fetal Status:  Fetal Heart Rate (bpm): 145   Movement: Present    General: Alert, oriented and cooperative. Patient is in no acute distress.  Skin: Skin is warm and dry. No rash noted.   Cardiovascular: Normal heart rate noted  Respiratory: Normal respiratory effort, no problems with respiration noted  Abdomen: Soft, gravid, appropriate for gestational age.  Pain/Pressure: Absent     Pelvic: Cervical exam deferred        Extremities: Normal range of motion.  Edema: Trace  Mental Status: Normal mood and affect. Normal behavior. Normal judgment and thought content.   Assessment and Plan:  Pregnancy: G3P2002 at [redacted]w[redacted]d 1. History of postpartum hypertension (Primary) Discussed ldASA - she is taking it.  CMP and P/C ratio today. She will go for it.   2. Encounter for supervision of other normal pregnancy in second trimester S/p anatomy but incomplete. Has F/u scheduled 7/10 Discussed MOC: Declines.   3. Obesity affecting pregnancy, antepartum, unspecified  obesity type Will have growth with MFM  4. Pregnancy with 20 completed weeks gestation   Preterm labor symptoms and general obstetric precautions including but not limited to vaginal bleeding, contractions, leaking of fluid and fetal movement were reviewed in detail with the patient. Please refer to After Visit Summary for other counseling recommendations.   Return in about 4 weeks (around 03/24/2024) for OB VISIT, MD or APP.  Future Appointments  Date Time Provider Department Center  03/25/2024  9:00 AM [facility] PROVIDER 1 [facility]  03/25/2024  9:30 AM [facility] US5 [facility]    Lacey Pian, MD

## 2024-02-26 ENCOUNTER — Ambulatory Visit: Payer: Self-pay | Admitting: Obstetrics and Gynecology

## 2024-02-26 LAB — PROTEIN / CREATININE RATIO, URINE
Creatinine, Urine: 317.5 mg/dL
Protein, Ur: 15.1 mg/dL
Protein/Creat Ratio: 48 mg/g{creat} (ref 0–200)

## 2024-02-26 LAB — COMPREHENSIVE METABOLIC PANEL WITH GFR
ALT: 7 IU/L (ref 0–32)
AST: 9 IU/L (ref 0–40)
Albumin: 3.8 g/dL — ABNORMAL LOW (ref 4.0–5.0)
Alkaline Phosphatase: 83 IU/L (ref 44–121)
BUN/Creatinine Ratio: 10 (ref 9–23)
BUN: 8 mg/dL (ref 6–20)
Bilirubin Total: 0.2 mg/dL (ref 0.0–1.2)
CO2: 18 mmol/L — ABNORMAL LOW (ref 20–29)
Calcium: 9.1 mg/dL (ref 8.7–10.2)
Chloride: 102 mmol/L (ref 96–106)
Creatinine, Ser: 0.81 mg/dL (ref 0.57–1.00)
Globulin, Total: 2.8 g/dL (ref 1.5–4.5)
Glucose: 84 mg/dL (ref 70–99)
Potassium: 4 mmol/L (ref 3.5–5.2)
Sodium: 135 mmol/L (ref 134–144)
Total Protein: 6.6 g/dL (ref 6.0–8.5)
eGFR: 101 mL/min/{1.73_m2} (ref >59)

## 2024-03-22 ENCOUNTER — Ambulatory Visit (INDEPENDENT_AMBULATORY_CARE_PROVIDER_SITE_OTHER): Admitting: Obstetrics & Gynecology

## 2024-03-22 VITALS — BP 111/70 | HR 71 | Wt 220.0 lb

## 2024-03-22 DIAGNOSIS — Z8759 Personal history of other complications of pregnancy, childbirth and the puerperium: Secondary | ICD-10-CM

## 2024-03-22 DIAGNOSIS — Z3480 Encounter for supervision of other normal pregnancy, unspecified trimester: Secondary | ICD-10-CM

## 2024-03-22 DIAGNOSIS — O09299 Supervision of pregnancy with other poor reproductive or obstetric history, unspecified trimester: Secondary | ICD-10-CM | POA: Diagnosis not present

## 2024-03-22 DIAGNOSIS — Z8679 Personal history of other diseases of the circulatory system: Secondary | ICD-10-CM | POA: Diagnosis not present

## 2024-03-22 NOTE — Progress Notes (Signed)
   PRENATAL VISIT NOTE  Subjective:  Kimberly Lewis is a 28 y.o. G3P2002 at [redacted]w[redacted]d being seen today for ongoing prenatal care.  She is currently monitored for the following issues for this high-risk pregnancy and has Inappropriate sinus tachycardia (HCC); Papanicolaou smear of cervix with low grade squamous intraepithelial lesion (LGSIL); History of postpartum hypertension; Supervision of normal pregnancy; BMI 39.0-39.9,adult; and Obesity affecting pregnancy, antepartum on their problem list.  Patient reports no complaints.  Contractions: Not present. Vag. Bleeding: None.  Movement: Present. Denies leaking of fluid.   The following portions of the patient's history were reviewed and updated as appropriate: allergies, current medications, past family history, past medical history, past social history, past surgical history and problem list.   Objective:    Vitals:   03/22/24 0827  BP: 111/70  Pulse: 71  Weight: 220 lb (99.8 kg)    Fetal Status:      Movement: Present    General: Alert, oriented and cooperative. Patient is in no acute distress.  Skin: Skin is warm and dry. No rash noted.   Cardiovascular: Normal heart rate noted  Respiratory: Normal respiratory effort, no problems with respiration noted  Abdomen: Soft, gravid, appropriate for gestational age.  Pain/Pressure: Absent     Pelvic: Cervical exam deferred        Extremities: Normal range of motion.  Edema: None  Mental Status: Normal mood and affect. Normal behavior. Normal judgment and thought content.   Assessment and Plan:  Pregnancy: G3P2002 at [redacted]w[redacted]d 1. History of postpartum hypertension (Primary) Pt is NOT taking asa b/c making her vomit and stomach ache.  Baseline pr/cr ration is 45; baseline cmp is nml for pregnancy Continue taking BP at home; Pre E precautions given.   2.  Obesity affecting pregnancy Serial growth US  and antenatal testing starting at 34 weeks.  Delivery 39-40 (pt would like to not be avoid  induction would understand if necessary).  3.  Information given on vasectomy.    4.  History of shoulder dystocia--8# 13 ounces, NICU admission, cord pH 7.11  30 seconds per note (08/26/2022)  Preterm labor symptoms and general obstetric precautions including but not limited to vaginal bleeding, contractions, leaking of fluid and fetal movement were reviewed in detail with the patient. Please refer to After Visit Summary for other counseling recommendations.   RTC 4 weeks.   Future Appointments  Date Time Provider Department Center  03/26/2024  8:00 AM [facility] PROVIDER 1 [facility]  03/26/2024  8:30 AM [facility] US6 [facility]    Burnard Pate, MD

## 2024-03-25 ENCOUNTER — Ambulatory Visit

## 2024-03-26 ENCOUNTER — Other Ambulatory Visit: Payer: Self-pay | Admitting: *Deleted

## 2024-03-26 ENCOUNTER — Ambulatory Visit (HOSPITAL_BASED_OUTPATIENT_CLINIC_OR_DEPARTMENT_OTHER)

## 2024-03-26 ENCOUNTER — Ambulatory Visit: Attending: Obstetrics and Gynecology | Admitting: Obstetrics

## 2024-03-26 VITALS — BP 109/53 | HR 70

## 2024-03-26 DIAGNOSIS — O99212 Obesity complicating pregnancy, second trimester: Secondary | ICD-10-CM

## 2024-03-26 DIAGNOSIS — E669 Obesity, unspecified: Secondary | ICD-10-CM | POA: Insufficient documentation

## 2024-03-26 DIAGNOSIS — O322XX0 Maternal care for transverse and oblique lie, not applicable or unspecified: Secondary | ICD-10-CM | POA: Insufficient documentation

## 2024-03-26 DIAGNOSIS — Z3A24 24 weeks gestation of pregnancy: Secondary | ICD-10-CM | POA: Diagnosis not present

## 2024-03-26 DIAGNOSIS — Z8679 Personal history of other diseases of the circulatory system: Secondary | ICD-10-CM

## 2024-03-26 DIAGNOSIS — O99210 Obesity complicating pregnancy, unspecified trimester: Secondary | ICD-10-CM

## 2024-03-26 DIAGNOSIS — O09292 Supervision of pregnancy with other poor reproductive or obstetric history, second trimester: Secondary | ICD-10-CM | POA: Diagnosis not present

## 2024-03-26 DIAGNOSIS — Z362 Encounter for other antenatal screening follow-up: Secondary | ICD-10-CM | POA: Diagnosis not present

## 2024-03-26 DIAGNOSIS — Z3482 Encounter for supervision of other normal pregnancy, second trimester: Secondary | ICD-10-CM

## 2024-03-26 NOTE — Progress Notes (Signed)
 MFM Consult Note  Kimberly Lewis is currently at 24 weeks and 4 days.  She has been followed due to maternal obesity with a BMI of 40.    The views of the fetal anatomy were unable to be fully visualized during her last exam.  She denies any problems since her last exam.  On today's exam, the overall EFW of 1 pound 7 ounces measures at the 21st percentile for her gestational age.  There was normal amniotic fluid noted.  The views of the fetal anatomy were visualized today.  There were no obvious anomalies noted.    The limitations of ultrasound in the detection of all anomalies was discussed.    Due to maternal obesity, we will continue to follow her with growth ultrasounds throughout her pregnancy.    As her BMI is greater than 40, weekly fetal testing should be started at around 34 weeks.    A follow-up growth scan was scheduled in 6 weeks.    The patient stated that all of her questions were answered today.  A total of 20 minutes was spent counseling and coordinating the care for this patient.  Greater than 50% of the time was spent in direct face-to-face contact.

## 2024-04-19 ENCOUNTER — Encounter: Payer: Self-pay | Admitting: Obstetrics & Gynecology

## 2024-04-19 ENCOUNTER — Ambulatory Visit (INDEPENDENT_AMBULATORY_CARE_PROVIDER_SITE_OTHER): Admitting: Obstetrics & Gynecology

## 2024-04-19 VITALS — BP 128/70 | HR 72 | Wt 222.0 lb

## 2024-04-19 DIAGNOSIS — M549 Dorsalgia, unspecified: Secondary | ICD-10-CM

## 2024-04-19 DIAGNOSIS — Z3A28 28 weeks gestation of pregnancy: Secondary | ICD-10-CM

## 2024-04-19 DIAGNOSIS — O99213 Obesity complicating pregnancy, third trimester: Secondary | ICD-10-CM

## 2024-04-19 DIAGNOSIS — Z8679 Personal history of other diseases of the circulatory system: Secondary | ICD-10-CM | POA: Diagnosis not present

## 2024-04-19 DIAGNOSIS — O09293 Supervision of pregnancy with other poor reproductive or obstetric history, third trimester: Secondary | ICD-10-CM

## 2024-04-19 DIAGNOSIS — O99891 Other specified diseases and conditions complicating pregnancy: Secondary | ICD-10-CM

## 2024-04-19 DIAGNOSIS — Z23 Encounter for immunization: Secondary | ICD-10-CM | POA: Diagnosis not present

## 2024-04-19 DIAGNOSIS — Z8759 Personal history of other complications of pregnancy, childbirth and the puerperium: Secondary | ICD-10-CM

## 2024-04-19 DIAGNOSIS — O99210 Obesity complicating pregnancy, unspecified trimester: Secondary | ICD-10-CM

## 2024-04-19 DIAGNOSIS — O09299 Supervision of pregnancy with other poor reproductive or obstetric history, unspecified trimester: Secondary | ICD-10-CM

## 2024-04-19 NOTE — Progress Notes (Signed)
   PRENATAL VISIT NOTE  Subjective:  Kimberly Lewis is a 28 y.o. G3P2002 at [redacted]w[redacted]d being seen today for ongoing prenatal care.  She is currently monitored for the following issues for this high-risk pregnancy and has Inappropriate sinus tachycardia (HCC); Papanicolaou smear of cervix with low grade squamous intraepithelial lesion (LGSIL); History of postpartum hypertension; Supervision of normal pregnancy; BMI 39.0-39.9,adult; Obesity affecting pregnancy, antepartum; and History of shoulder dystocia in prior pregnancy, currently pregnant on their problem list.  Patient reports nausea.  Contractions: Irritability. Vag. Bleeding: None.  Movement: Present. Denies leaking of fluid.   The following portions of the patient's history were reviewed and updated as appropriate: allergies, current medications, past family history, past medical history, past social history, past surgical history and problem list.   Objective:    Vitals:   04/19/24 0831  BP: 128/70  Pulse: 72  Weight: 222 lb (100.7 kg)    Fetal Status:  Fetal Heart Rate (bpm): 133   Movement: Present    General: Alert, oriented and cooperative. Patient is in no acute distress.  Skin: Skin is warm and dry. No rash noted.   Cardiovascular: Normal heart rate noted  Respiratory: Normal respiratory effort, no problems with respiration noted  Abdomen: Soft, gravid, appropriate for gestational age.  Pain/Pressure: Present     Pelvic: Cervical exam deferred        Extremities: Normal range of motion.  Edema: None  Mental Status: Normal mood and affect. Normal behavior. Normal judgment and thought content.   Assessment and Plan:  Pregnancy: G3P2002 at [redacted]w[redacted]d 1. [redacted] weeks gestation of pregnancy (Primary) GTT and 28 week labs  2. Need for Tdap vaccination Vaccination today   3. Obesity affecting pregnancy, antepartum, unspecified obesity type Serial US  with MFM  4. History of shoulder dystocia in prior pregnancy, currently  pregnant 8 pounds 13 ounces  5. History of postpartum hypertension Taking BP at home--all nml per patient  6.  Back pain PT referral, pregnancy belt, luke warm Epson salt baths, massage, yoga,   Preterm labor symptoms and general obstetric precautions including but not limited to vaginal bleeding, contractions, leaking of fluid and fetal movement were reviewed in detail with the patient. Please refer to After Visit Summary for other counseling recommendations.   No follow-ups on file.  Future Appointments  Date Time Provider Department Center  04/19/2024  8:50 AM Cris Burnard DEL, MD [facility]  05/07/2024 11:00 AM [facility] PROVIDER 1 [facility]  05/07/2024 11:30 AM [facility] US1 [facility]  06/04/2024  9:30 AM [facility] NURSE [facility]  06/04/2024  9:45 AM [facility] NST [facility]    Burnard Cris, MD

## 2024-04-20 LAB — RPR: RPR Ser Ql: NONREACTIVE

## 2024-04-20 LAB — GLUCOSE TOLERANCE, 2 HOURS W/ 1HR
Glucose, 1 hour: 174 mg/dL (ref 70–179)
Glucose, 2 hour: 132 mg/dL (ref 70–152)
Glucose, Fasting: 80 mg/dL (ref 70–91)

## 2024-04-20 LAB — HIV ANTIBODY (ROUTINE TESTING W REFLEX): HIV Screen 4th Generation wRfx: NONREACTIVE

## 2024-04-21 ENCOUNTER — Ambulatory Visit: Payer: Self-pay | Admitting: Obstetrics & Gynecology

## 2024-04-26 NOTE — Therapy (Addendum)
 OUTPATIENT PHYSICAL THERAPY THORACOLUMBAR EVALUATION   Patient Name: Kimberly Lewis MRN: [id number] DOB:June 02, 1996, 28 y.o., female Today's Date: 04/27/2024  END OF SESSION:  PT End of Session - 04/27/24 1400     Visit Number 1    Number of Visits 16    Date for PT Re-Evaluation 06/22/24    Authorization Type [employer] auth required copay $4    PT Start Time 1320   late for appt   PT Stop Time 1400    PT Time Calculation (min) 40 min    Activity Tolerance Patient tolerated treatment well          Past Medical History:  Diagnosis Date   Depression    Fibroids    Inappropriate sinus tachycardia (HCC)    PCOS (polycystic ovarian syndrome)    Posttraumatic stress disorder 12/14/2015   Trichomonas infection 2017   UTI (urinary tract infection)    Past Surgical History:  Procedure Laterality Date   CHOLECYSTECTOMY     WISDOM TOOTH EXTRACTION Bilateral    Patient Active Problem List   Diagnosis Date Noted   History of shoulder dystocia in prior pregnancy, currently pregnant 03/22/2024   Obesity affecting pregnancy, antepartum 02/16/2024   BMI 39.0-39.9,adult 01/05/2024   Supervision of normal pregnancy 12/09/2023   History of postpartum hypertension 08/27/2022   Inappropriate sinus tachycardia (HCC) 02/04/2022   Papanicolaou smear of cervix with low grade squamous intraepithelial lesion (LGSIL) 07/05/2020    PCP: No PCP  REFERRING PROVIDER: Dr Burnard VEAR Pate  REFERRING DIAG: Back pain in pregnancy - third trimester   Rationale for Evaluation and Treatment: Rehabilitation  THERAPY DIAG:  Other low back pain  Other symptoms and signs involving the musculoskeletal system  ONSET DATE: 12/30/23  SUBJECTIVE:                                                                                                                                                                                           SUBJECTIVE STATEMENT: Patient reports that she has had back pain in the  past 3- 4 months. She has pain in the spot where she had epidurals in the past two pregnancies 08/26/22 and 03/26/2012. She feels that she had damage to LB when they did the past epidural. She notices gradual onset of LBP in the past 4 months with pain increasing as the pregnancy has progressed. Now [redacted] weeks pregnant   PERTINENT HISTORY:  LBP since the first pregnancy; gall bladder surgery 2024; history of sinus tachycardia with no medication; bilat knee pain     PAIN:  Are you having pain? Yes: NPRS scale: 4/10 Pain location: bilat LBP  Pain description: constant; sometimes aching; sometimes intense  Aggravating factors: any movement and at rest - standing and bending  Relieving factors: pregnancy belt that supports belly  PRECAUTIONS: Other: third trimester pregnancy  RED FLAGS: None   WEIGHT BEARING RESTRICTIONS: No  FALLS:  Has patient fallen in last 6 months? Yes. Number of falls 1 no injury dropped to knees   LIVING ENVIRONMENT: Lives with: lives with their family Lives in: House/apartment Stairs: Yes: External: 4 steps; on left going up Has following equipment at home: None  OCCUPATION: not working - worked until 5 months pregnancy on feet for ~ 10 hour days   PLOF: Independent  PATIENT GOALS: help get rid of pain   NEXT MD VISIT: OB 05/06/24  OBJECTIVE:  Note: Objective measures were completed at Evaluation unless otherwise noted.  DIAGNOSTIC FINDINGS:  None for lumbar spine   PATIENT SURVEYS:  PSFS: THE PATIENT SPECIFIC FUNCTIONAL SCALE  Place score of 0-10 (0 = unable to perform activity and 10 = able to perform activity at the same level as before injury or problem)  Activity Date: 04/27/24    Standing > 20 min 6    2. Bending  3    3. Lifting > 20 pounds  5    4. Walking > 10 min  5    Total Score 18      Total Score = Sum of activity scores/number of activities  18/4 = 4.5  Minimally Detectable Change: 3 points (for single activity); 2 points (for  average score)  [facility] (nd). The Patient Specific Functional Scale . Retrieved from [email or web address]   COGNITION: Overall cognitive status: Within functional limits for tasks assessed     SENSATION: WFL  POSTURE: rounded shoulders, forward head, increased lumbar lordosis, increased thoracic kyphosis, and flexed trunk - stands with bilat knees hyperextended   PALPATION: Tightness bilat lumbar musculature   LUMBAR ROM:   AROM eval  Flexion 70% pull; pain return to stand    Extension 25% pain   Right lateral flexion 90%   Left lateral flexion 90% pull R; round ligament   Right rotation   Left rotation    (Blank rows = not tested)  LOWER EXTREMITY ROM:  WFL's   Active  Right eval Left eval  Hip flexion    Hip extension    Hip abduction    Hip adduction    Hip internal rotation    Hip external rotation    Knee flexion    Knee extension    Ankle dorsiflexion    Ankle plantarflexion    Ankle inversion    Ankle eversion     (Blank rows = not tested)  LOWER EXTREMITY MMT:  functional weakness in knee extension - standing with knees hyperextended   MMT Right eval Left eval  Hip flexion    Hip extension    Hip abduction    Hip adduction    Hip internal rotation    Hip external rotation    Knee flexion    Knee extension    Ankle dorsiflexion    Ankle plantarflexion    Ankle inversion    Ankle eversion     (Blank rows = not tested)  GAIT: Distance walked: 40 feet Assistive device utilized: None Level of assistance: Complete Independence Comments: wide based gait; knees hyperextended   TREATMENT DATE: 04/27/24  Eval; HEP  PATIENT EDUCATION:  Education details: POC; HEP  Person educated: Patient Education method: Programmer, multimedia, Demonstration, Actor cues,  Verbal cues, and Handouts Education comprehension: verbalized understanding, returned demonstration, verbal cues required, tactile cues required, and needs further education  HOME EXERCISE PROGRAM: Access Code: [email or web address] URL: [email or web address] Date: 04/27/2024 Prepared by: Tahari Clabaugh  Exercises - Seated Flexion Stretch with Swiss Ball  - 2 x daily - 7 x weekly - 1 sets - 3 reps - 30 sec  hold - Seated Flexion Stretch  - 2 x daily - 7 x weekly - 1 sets - 3 reps - 30 sec  hold - Cat Cow  - 2 x daily - 7 x weekly - 1 sets - 5-10 reps - 3-5 sec  hold - Seated Anterior Pelvic Tilt  - 2 x daily - 7 x weekly - 1 sets - 10 reps - 2-3 sec  hold -Anterior/posterior pelvic tilt sitting on ball   ASSESSMENT:  CLINICAL IMPRESSION: Patient is a 28 y.o. female who was seen today for physical therapy evaluation and treatment for back pain in third trimester of pregnancy. She presents with limited trunk ROM/mobility; muscular tightness through lumbar spine; LBP which is constant in nature; limited functional activity level due to back pain. Patient stands with bilat knees in hyperextended. Patient will benefit from PT to address problems identified.   OBJECTIVE IMPAIRMENTS: Abnormal gait, decreased mobility, decreased ROM, decreased strength, improper body mechanics, postural dysfunction, obesity, and pain.   ACTIVITY LIMITATIONS: carrying, lifting, bending, sitting, standing, squatting, sleeping, stairs, transfers, bed mobility, and locomotion level  PARTICIPATION LIMITATIONS: meal prep, cleaning, laundry, driving, shopping, community activity, and occupation  PERSONAL FACTORS: Fitness, Past/current experiences, and Time since onset of injury/illness/exacerbation are also affecting patient's functional outcome.   REHAB POTENTIAL: Good  CLINICAL DECISION MAKING: Evolving/moderate complexity  EVALUATION COMPLEXITY: Moderate   GOALS: Goals reviewed with patient? Yes  SHORT TERM  GOALS: Target date: 05/25/2024   Independent in initial HEP  Baseline: Goal status: INITIAL  2.  Patient demonstrates appropriate standing posture without hyperextension of knees  Baseline:  Goal status: INITIAL  3.  Patient demonstrates ability to sit for 10 min with minimal to no increase in pain  Baseline:  Goal status: INITIAL  LONG TERM GOALS: Target date: 06/22/2024   Patient reports ability to stand for 20 min with minimal to no increase in pain for household chores and activities such as cooking and laundry Baseline:  Goal status: INITIAL  2.  Patient demonstrates proper bending techniques with UE assist as needed Baseline:  Goal status: INITIAL  3.  Patient demonstrates and/or verbalizes proper lifting techniques for > 20 pounds  Baseline:  Goal status: INITIAL  4.  Patient will ambulate for 10 in with minimal to no increase in pain for community distances such as grocery shopping  Baseline:  Goal status: INITIAL  5.  Increase PSFS: THE PATIENT SPECIFIC FUNCTIONAL SCALE score by 2 points   Place score of 0-10 (0 = unable to perform activity and 10 = able to perform activity at the same level as before injury or problem)  Activity Date: 04/27/24    Standing > 20 min 6    2. Bending  3    3. Lifting > 20 pounds  5    4. Walking > 10 min  5    Total Score 18      Total Score = Sum of activity scores/number of activities  18/4 = 4.5 Baseline: 4.5  Goal status:  INITIAL  6.  Independent in advanced HEP  Baseline:  Goal status: INITIAL  PLAN:  PT FREQUENCY: 1-2x/week  PT DURATION: 8 weeks  PLANNED INTERVENTIONS: 97164- PT Re-evaluation, 97110-Therapeutic exercises, 97530- Therapeutic activity, 97112- Neuromuscular re-education, [phone]- Self Care, 02859- Manual therapy, [phone]- Gait training, [phone]- Aquatic Therapy, Patient/Family education, Balance training, Stair training, Taping  PLAN FOR NEXT SESSION: review and progress exercises; continue with back care  and ergonomic education and correction; manual work and modalities as indicated    Jarone Ostergaard SHAUNNA Baptist, PT 04/27/2024, 2:36 PM   .[place] Authorization   Choose one: Rehabilitative  Standardized Assessment or Functional Outcome Tool: See Pain Assessment  Score or Percent Disability: PSFS: THE PATIENT SPECIFIC FUNCTIONAL SCALE = 4.5  Body Parts Treated (Select each separately):  Lumbopelvic. Overall deficits/functional limitations for body part selected: moderate       If treatment provided at initial evaluation, no treatment charged due to lack of authorization.

## 2024-04-27 ENCOUNTER — Encounter: Payer: Self-pay | Admitting: Rehabilitative and Restorative Service Providers"

## 2024-04-27 ENCOUNTER — Ambulatory Visit: Attending: Obstetrics & Gynecology | Admitting: Rehabilitative and Restorative Service Providers"

## 2024-04-27 ENCOUNTER — Other Ambulatory Visit: Payer: Self-pay

## 2024-04-27 DIAGNOSIS — O26893 Other specified pregnancy related conditions, third trimester: Secondary | ICD-10-CM | POA: Insufficient documentation

## 2024-04-27 DIAGNOSIS — M5459 Other low back pain: Secondary | ICD-10-CM | POA: Diagnosis not present

## 2024-04-27 DIAGNOSIS — R29898 Other symptoms and signs involving the musculoskeletal system: Secondary | ICD-10-CM | POA: Diagnosis not present

## 2024-04-27 DIAGNOSIS — Z3A29 29 weeks gestation of pregnancy: Secondary | ICD-10-CM | POA: Insufficient documentation

## 2024-04-29 ENCOUNTER — Ambulatory Visit: Admitting: Rehabilitative and Restorative Service Providers"

## 2024-04-29 ENCOUNTER — Encounter: Payer: Self-pay | Admitting: Rehabilitative and Restorative Service Providers"

## 2024-04-29 DIAGNOSIS — O26893 Other specified pregnancy related conditions, third trimester: Secondary | ICD-10-CM | POA: Diagnosis not present

## 2024-04-29 DIAGNOSIS — M5459 Other low back pain: Secondary | ICD-10-CM

## 2024-04-29 DIAGNOSIS — R29898 Other symptoms and signs involving the musculoskeletal system: Secondary | ICD-10-CM

## 2024-04-29 NOTE — Therapy (Signed)
 OUTPATIENT PHYSICAL THERAPY THORACOLUMBAR TREATMENT   Patient Name: Kimberly Lewis MRN: [id number] DOB:1995-10-28, 28 y.o., female Today's Date: 04/29/2024  END OF SESSION:  PT End of Session - 04/29/24 1112     Visit Number 2    Number of Visits 16    Date for PT Re-Evaluation 06/22/24    Authorization Type [employer] auth required copay $4    PT Start Time 1107    PT Stop Time 1145    PT Time Calculation (min) 38 min          Past Medical History:  Diagnosis Date   Depression    Fibroids    Inappropriate sinus tachycardia (HCC)    PCOS (polycystic ovarian syndrome)    Posttraumatic stress disorder 12/14/2015   Trichomonas infection 2017   UTI (urinary tract infection)    Past Surgical History:  Procedure Laterality Date   CHOLECYSTECTOMY     WISDOM TOOTH EXTRACTION Bilateral    Patient Active Problem List   Diagnosis Date Noted   History of shoulder dystocia in prior pregnancy, currently pregnant 03/22/2024   Obesity affecting pregnancy, antepartum 02/16/2024   BMI 39.0-39.9,adult 01/05/2024   Supervision of normal pregnancy 12/09/2023   History of postpartum hypertension 08/27/2022   Inappropriate sinus tachycardia (HCC) 02/04/2022   Papanicolaou smear of cervix with low grade squamous intraepithelial lesion (LGSIL) 07/05/2020    PCP: No PCP  REFERRING PROVIDER: Dr Burnard VEAR Pate  REFERRING DIAG: Back pain in pregnancy - third trimester   Rationale for Evaluation and Treatment: Rehabilitation  THERAPY DIAG:  Other low back pain  Other symptoms and signs involving the musculoskeletal system  ONSET DATE: 12/30/23  SUBJECTIVE:                                                                                                                                                                                           SUBJECTIVE STATEMENT: Patient reports that he pain is worse today and she is not sure why. She was moving around more and sitting to drive more.  Does have a lot of tightness in the upper back area    Eval: Patient reports that she has had back pain in the past 3- 4 months. She has pain in the spot where she had epidurals in the past two pregnancies 08/26/22 and 03/26/2012. She feels that she had damage to LB when they did the past epidural. She notices gradual onset of LBP in the past 4 months with pain increasing as the pregnancy has progressed. Now [redacted] weeks pregnant   PERTINENT HISTORY:  LBP since the first pregnancy; gall bladder surgery 2024; history of  sinus tachycardia with no medication; bilat knee pain     PAIN:  Are you having pain? Yes: NPRS scale: 7/10 Pain location: bilat LBP Pain description: constant; sometimes aching; sometimes intense  Aggravating factors: any movement and at rest - standing and bending  Relieving factors: pregnancy belt that supports belly  PRECAUTIONS: Other: third trimester pregnancy  WEIGHT BEARING RESTRICTIONS: No  FALLS:  Has patient fallen in last 6 months? Yes. Number of falls 1 no injury dropped to knees   LIVING ENVIRONMENT: Lives with: lives with their family Lives in: House/apartment Stairs: Yes: External: 4 steps; on left going up Has following equipment at home: None  OCCUPATION: not working - worked until 5 months pregnancy on feet for ~ 10 hour days    PATIENT GOALS: help get rid of pain   NEXT MD VISIT: OB 05/06/24  OBJECTIVE:  Note: Objective measures were completed at Evaluation unless otherwise noted.  DIAGNOSTIC FINDINGS:  None for lumbar spine   PATIENT SURVEYS:  PSFS: THE PATIENT SPECIFIC FUNCTIONAL SCALE  Place score of 0-10 (0 = unable to perform activity and 10 = able to perform activity at the same level as before injury or problem)  Activity Date: 04/27/24    Standing > 20 min 6    2. Bending  3    3. Lifting > 20 pounds  5    4. Walking > 10 min  5    Total Score 18      Total Score = Sum of activity scores/number of activities  18/4 =  4.5  Minimally Detectable Change: 3 points (for single activity); 2 points (for average score)  [facility] (nd). The Patient Specific Functional Scale . Retrieved from [email or web address]     SENSATION: WFL  POSTURE: rounded shoulders, forward head, increased lumbar lordosis, increased thoracic kyphosis, and flexed trunk - stands with bilat knees hyperextended   PALPATION: Tightness bilat lumbar musculature   LUMBAR ROM:   AROM eval  Flexion 70% pull; pain return to stand    Extension 25% pain   Right lateral flexion 90%   Left lateral flexion 90% pull R; round ligament   Right rotation   Left rotation    (Blank rows = not tested)  LOWER EXTREMITY ROM:  WFL's   Active  Right eval Left eval  Hip flexion    Hip extension    Hip abduction    Hip adduction    Hip internal rotation    Hip external rotation    Knee flexion    Knee extension    Ankle dorsiflexion    Ankle plantarflexion    Ankle inversion    Ankle eversion     (Blank rows = not tested)  LOWER EXTREMITY MMT:  functional weakness in knee extension - standing with knees hyperextended   MMT Right eval Left eval  Hip flexion    Hip extension    Hip abduction    Hip adduction    Hip internal rotation    Hip external rotation    Knee flexion    Knee extension    Ankle dorsiflexion    Ankle plantarflexion    Ankle inversion    Ankle eversion     (Blank rows = not tested)  GAIT: Distance walked: 40 feet Assistive device utilized: None Level of assistance: Complete Independence Comments: wide based gait; knees hyperextended   [facility] Adult PT Treatment:  DATE: 04/29/24 Therapeutic Exercise: Sitting on red therapeutic ball rolling up/back Sitting on red therapeutic ball for posterior pelvic tilt  Sitting on red ball for posterior pelvic tilt   Sitting posterior pelvic tilt   Neuromuscular re-ed: Sitting pelvic floor activation 10 sec x 10  Scap squeeze sitting with noodle 3 sec x 10  Sitting scap squeeze red TB 3 sec x 10  Sitting scap squeeze with W red TB 3 sec x 10  Backward shoulder rolls  Lateral cervical flexion 5 sec x 3 R/L  Therapeutic Activity: Rolling red therapeutic ball forward x 10  Rolling red therapeutic ball forward and then side to side x 10  Deep breath in raising arms overhead, breathe in pause then bring arms down as you breathe out opening/lifting ribs  Self Care: Sitting with noodle along spine                                                                                                                             PATIENT EDUCATION:  Education details: POC; HEP  Person educated: Patient Education method: Programmer, multimedia, Demonstration, Tactile cues, Verbal cues, and Handouts Education comprehension: verbalized understanding, returned demonstration, verbal cues required, tactile cues required, and needs further education  HOME EXERCISE PROGRAM: Access Code: [email or web address] URL: [email or web address] Date: 04/29/2024 Prepared by: Florene Baptist  Exercises - Seated Flexion Stretch with Swiss Ball  - 2 x daily - 7 x weekly - 1 sets - 3 reps - 30 sec  hold - Seated Flexion Stretch  - 2 x daily - 7 x weekly - 1 sets - 3 reps - 30 sec  hold - Cat Cow  - 2 x daily - 7 x weekly - 1 sets - 5-10 reps - 3-5 sec  hold - Seated Anterior Pelvic Tilt  - 2 x daily - 7 x weekly - 1 sets - 10 reps - 2-3 sec  hold - Seated Pelvic Floor Contraction  - 2 x daily - 7 x weekly - 1 sets - 10 reps - 10 sec  hold - Pelvic Tilt on Swiss Ball  - 2 x daily - 7 x weekly - 1 sets - Seated Cervical Sidebending AROM  - 2 x daily - 7 x weekly - 1 sets - 3-5 reps - 5-10 sec  hold - Seated Scapular Retraction  - 2 x daily - 7 x weekly - 1-2 sets - 10 reps - 10 sec  hold - Shoulder External Rotation and Scapular Retraction with Resistance  - 2 x daily - 7 x  weekly - 1 sets - 10 reps - 3-5 sec  hold - Shoulder W - External Rotation with Resistance  - 2 x daily - 7 x weekly - 1-2 sets - 10 reps - 3 sec  hold - Standing Backward Shoulder Rolls  - 2 x daily - 7 x weekly - 1 sets - 10 reps - 1-2 sec  hold  ASSESSMENT:  CLINICAL IMPRESSION:  Reviewed and progressed exercises adding posterior shoulder girdle strengthening to address postural control.   Eval: Patient is a 28 y.o. female who was seen today for physical therapy evaluation and treatment for back pain in third trimester of pregnancy. She presents with limited trunk ROM/mobility; muscular tightness through lumbar spine; LBP which is constant in nature; limited functional activity level due to back pain. Patient stands with bilat knees in hyperextended. Patient will benefit from PT to address problems identified.    GOALS: Goals reviewed with patient? Yes  SHORT TERM GOALS: Target date: 05/25/2024   Independent in initial HEP  Baseline: Goal status: INITIAL  2.  Patient demonstrates appropriate standing posture without hyperextension of knees  Baseline:  Goal status: INITIAL  3.  Patient demonstrates ability to sit for 10 min with minimal to no increase in pain  Baseline:  Goal status: INITIAL  LONG TERM GOALS: Target date: 06/22/2024   Patient reports ability to stand for 20 min with minimal to no increase in pain for household chores and activities such as cooking and laundry Baseline:  Goal status: INITIAL  2.  Patient demonstrates proper bending techniques with UE assist as needed Baseline:  Goal status: INITIAL  3.  Patient demonstrates and/or verbalizes proper lifting techniques for > 20 pounds  Baseline:  Goal status: INITIAL  4.  Patient will ambulate for 10 in with minimal to no increase in pain for community distances such as grocery shopping  Baseline:  Goal status: INITIAL  5.  Increase PSFS: THE PATIENT SPECIFIC FUNCTIONAL SCALE score by 2 points   Place  score of 0-10 (0 = unable to perform activity and 10 = able to perform activity at the same level as before injury or problem)  Activity Date: 04/27/24    Standing > 20 min 6    2. Bending  3    3. Lifting > 20 pounds  5    4. Walking > 10 min  5    Total Score 18      Total Score = Sum of activity scores/number of activities  18/4 = 4.5 Baseline: 4.5  Goal status: INITIAL  6.  Independent in advanced HEP  Baseline:  Goal status: INITIAL  PLAN:  PT FREQUENCY: 1-2x/week  PT DURATION: 8 weeks  PLANNED INTERVENTIONS: 97164- PT Re-evaluation, 97110-Therapeutic exercises, 97530- Therapeutic activity, 97112- Neuromuscular re-education, [phone]- Self Care, 02859- Manual therapy, [phone]- Gait training, [phone]- Aquatic Therapy, Patient/Family education, Balance training, Stair training, Taping  PLAN FOR NEXT SESSION: review and progress exercises; continue with back care and ergonomic education and correction; manual work and modalities as indicated    Shamanda Len [employer], PT 04/29/2024, 11:12 AM   .[place] Authorization   Choose one: Rehabilitative  Standardized Assessment or Functional Outcome Tool: See Pain Assessment  Score or Percent Disability: PSFS: THE PATIENT SPECIFIC FUNCTIONAL SCALE = 4.5  Body Parts Treated (Select each separately):  Lumbopelvic. Overall deficits/functional limitations for body part selected: moderate       If treatment provided at initial evaluation, no treatment charged due to lack of authorization.

## 2024-05-03 ENCOUNTER — Ambulatory Visit

## 2024-05-03 DIAGNOSIS — M5459 Other low back pain: Secondary | ICD-10-CM

## 2024-05-03 DIAGNOSIS — O26893 Other specified pregnancy related conditions, third trimester: Secondary | ICD-10-CM | POA: Diagnosis not present

## 2024-05-03 DIAGNOSIS — R29898 Other symptoms and signs involving the musculoskeletal system: Secondary | ICD-10-CM

## 2024-05-03 NOTE — Therapy (Signed)
 OUTPATIENT PHYSICAL THERAPY THORACOLUMBAR TREATMENT   Patient Name: Kimberly Lewis MRN: [id number] DOB:12-12-1995, 28 y.o., female Today's Date: 05/03/2024  END OF SESSION:  PT End of Session - 05/03/24 1019     Visit Number 3    Number of Visits 16    Date for PT Re-Evaluation 06/22/24    Authorization Type [employer] auth required copay $4    PT Start Time 1019    PT Stop Time 1107    PT Time Calculation (min) 48 min    Activity Tolerance Patient tolerated treatment well    Behavior During Therapy [facility] for tasks assessed/performed          Past Medical History:  Diagnosis Date   Depression    Fibroids    Inappropriate sinus tachycardia (HCC)    PCOS (polycystic ovarian syndrome)    Posttraumatic stress disorder 12/14/2015   Trichomonas infection 2017   UTI (urinary tract infection)    Past Surgical History:  Procedure Laterality Date   CHOLECYSTECTOMY     WISDOM TOOTH EXTRACTION Bilateral    Patient Active Problem List   Diagnosis Date Noted   History of shoulder dystocia in prior pregnancy, currently pregnant 03/22/2024   Obesity affecting pregnancy, antepartum 02/16/2024   BMI 39.0-39.9,adult 01/05/2024   Supervision of normal pregnancy 12/09/2023   History of postpartum hypertension 08/27/2022   Inappropriate sinus tachycardia (HCC) 02/04/2022   Papanicolaou smear of cervix with low grade squamous intraepithelial lesion (LGSIL) 07/05/2020    PCP: No PCP  REFERRING PROVIDER: Dr Burnard VEAR Pate  REFERRING DIAG: Back pain in pregnancy - third trimester   Rationale for Evaluation and Treatment: Rehabilitation  THERAPY DIAG:  Other low back pain  Other symptoms and signs involving the musculoskeletal system  ONSET DATE: 12/30/23  SUBJECTIVE:                                                                                                                                                                                           SUBJECTIVE  STATEMENT: Patient reports her back tends to hurt worse at end of the day; states she rested over the weekend and hasn't done too much this morning. Patient states she plans on starting a walking program. Patient states she sleeps on her Lt side due to acid reflux and her 29 year old sleeps with her as well; states she needs to get a pregnancy pillow.  Eval: Patient reports that she has had back pain in the past 3- 4 months. She has pain in the spot where she had epidurals in the past two pregnancies 08/26/22 and 03/26/2012. She feels that she had damage  to LB when they did the past epidural. She notices gradual onset of LBP in the past 4 months with pain increasing as the pregnancy has progressed. Now [redacted] weeks pregnant   PERTINENT HISTORY:  LBP since the first pregnancy; gall bladder surgery 2024; history of sinus tachycardia with no medication; bilat knee pain     PAIN:  Are you having pain? Yes: NPRS scale: 7/10 Pain location: bilat LBP Pain description: constant; sometimes aching; sometimes intense  Aggravating factors: any movement and at rest - standing and bending  Relieving factors: pregnancy belt that supports belly  PRECAUTIONS: Other: third trimester pregnancy  WEIGHT BEARING RESTRICTIONS: No  FALLS:  Has patient fallen in last 6 months? Yes. Number of falls 1 no injury dropped to knees   LIVING ENVIRONMENT: Lives with: lives with their family Lives in: House/apartment Stairs: Yes: External: 4 steps; on left going up Has following equipment at home: None  OCCUPATION: not working - worked until 5 months pregnancy on feet for ~ 10 hour days    PATIENT GOALS: help get rid of pain   NEXT MD VISIT: OB 05/06/24  OBJECTIVE:  Note: Objective measures were completed at Evaluation unless otherwise noted.  DIAGNOSTIC FINDINGS:  None for lumbar spine   PATIENT SURVEYS:  PSFS: THE PATIENT SPECIFIC FUNCTIONAL SCALE  Place score of 0-10 (0 = unable to perform activity and 10 =  able to perform activity at the same level as before injury or problem)  Activity Date: 04/27/24    Standing > 20 min 6    2. Bending  3    3. Lifting > 20 pounds  5    4. Walking > 10 min  5    Total Score 18      Total Score = Sum of activity scores/number of activities  18/4 = 4.5  Minimally Detectable Change: 3 points (for single activity); 2 points (for average score)  [facility] (nd). The Patient Specific Functional Scale . Retrieved from [email or web address]     SENSATION: WFL  POSTURE: rounded shoulders, forward head, increased lumbar lordosis, increased thoracic kyphosis, and flexed trunk - stands with bilat knees hyperextended   PALPATION: Tightness bilat lumbar musculature   LUMBAR ROM:   AROM eval  Flexion 70% pull; pain return to stand    Extension 25% pain   Right lateral flexion 90%   Left lateral flexion 90% pull R; round ligament   Right rotation   Left rotation    (Blank rows = not tested)  LOWER EXTREMITY ROM:  WFL's   Active  Right eval Left eval  Hip flexion    Hip extension    Hip abduction    Hip adduction    Hip internal rotation    Hip external rotation    Knee flexion    Knee extension    Ankle dorsiflexion    Ankle plantarflexion    Ankle inversion    Ankle eversion     (Blank rows = not tested)  LOWER EXTREMITY MMT:  functional weakness in knee extension - standing with knees hyperextended   MMT Right eval Left eval  Hip flexion    Hip extension    Hip abduction    Hip adduction    Hip internal rotation    Hip external rotation    Knee flexion    Knee extension    Ankle dorsiflexion    Ankle plantarflexion    Ankle inversion    Ankle eversion     (  Blank rows = not tested)  GAIT: Distance walked: 40 feet Assistive device utilized: None Level of assistance: Complete Independence Comments: wide based gait; knees hyperextended    [facility] Adult PT  Treatment:                                                DATE: 05/03/2024 Neuromuscular re-ed: Side Lying over green bolster (propped on elbows): Clamshells + green TB (bil) --> anterior hip ligament pain on Lt Bent knee hip abd in neutral --> leg propped on foam roller Reverse clamshell --> leg propped on foam roller Seated SLR 2x10x5 --> knee slightly bent Seated thoracic extension with yoga roll horizontal at back + towel for lumbar support/cue Therapeutic Activity: Quadruped: Cat with focus on lumbar flexion mobility Cat + yoga block stabilization b/w knees Seated on dynadisc --> A/P pelvic tilts Self Care: Pregnancy pillow Sitting with towel roll along spine for postural support Folded towel for lumbar support   [facility] Adult PT Treatment:                                                DATE: 04/29/24 Therapeutic Exercise: Sitting on red therapeutic ball rolling up/back Sitting on red therapeutic ball for posterior pelvic tilt  Sitting on red ball for posterior pelvic tilt   Sitting posterior pelvic tilt  Neuromuscular re-ed: Sitting pelvic floor activation 10 sec x 10  Scap squeeze sitting with noodle 3 sec x 10  Sitting scap squeeze red TB 3 sec x 10  Sitting scap squeeze with W red TB 3 sec x 10  Backward shoulder rolls  Lateral cervical flexion 5 sec x 3 R/L  Therapeutic Activity: Rolling red therapeutic ball forward x 10  Rolling red therapeutic ball forward and then side to side x 10  Deep breath in raising arms overhead, breathe in pause then bring arms down as you breathe out opening/lifting ribs  Self Care: Sitting with noodle along spine                                                                                                                             PATIENT EDUCATION:  Education details: Updated HEP  Person educated: Patient Education method: Explanation, Demonstration, Tactile cues, Verbal cues, and Handouts Education comprehension: verbalized  understanding, returned demonstration, verbal cues required, tactile cues required, and needs further education  HOME EXERCISE PROGRAM: Access Code: [email or web address] URL: [email or web address] Date: 05/03/2024 Prepared by: Lamarr Price  Exercises - Seated Flexion Stretch with Swiss Ball  - 2 x daily - 7 x weekly - 1 sets - 3 reps - 30 sec  hold - Cat Cow  - 2 x daily - 7 x  weekly - 1 sets - 5-10 reps - 3-5 sec  hold - Seated Pelvic Floor Contraction  - 2 x daily - 7 x weekly - 1 sets - 10 reps - 10 sec  hold - Pelvic Tilt on Swiss Ball  - 2 x daily - 7 x weekly - 1 sets - Seated Cervical Sidebending AROM  - 2 x daily - 7 x weekly - 1 sets - 3-5 reps - 5-10 sec  hold - Seated Scapular Retraction  - 2 x daily - 7 x weekly - 1-2 sets - 10 reps - 10 sec  hold - Shoulder External Rotation and Scapular Retraction with Resistance  - 2 x daily - 7 x weekly - 1 sets - 10 reps - 3-5 sec  hold - Shoulder W - External Rotation with Resistance  - 2 x daily - 7 x weekly - 1-2 sets - 10 reps - 3 sec  hold - Standing Backward Shoulder Rolls  - 2 x daily - 7 x weekly - 1 sets - 10 reps - 1-2 sec  hold - Sidelying Reverse Clamshell  - 1 x daily - 7 x weekly - 1-3 sets - 10 reps - Sidelying Bent Knee Lift at 45 Degrees  - 1 x daily - 7 x weekly - 1-3 sets - 10 reps - Seated Active Straight-Leg Raise  - 1 x daily - 7 x weekly - 3 sets - 10 reps - 5 sec hold  ASSESSMENT:  CLINICAL IMPRESSION: Exercises progressed focusing on lumbar flexion mobility in quadruped with pelvic floor activation and supported side lying hip strengthening exercises. Reported ligament pain along Lt inguinal area during clamshells in side lying; patient able to complete supported hip abduction and reverse clamshell variations with no exacerbation of pain/discomfort.   Eval: Patient is a 28 y.o. female who was seen today for physical therapy evaluation and treatment for back pain in third trimester of pregnancy. She presents  with limited trunk ROM/mobility; muscular tightness through lumbar spine; LBP which is constant in nature; limited functional activity level due to back pain. Patient stands with bilat knees in hyperextended. Patient will benefit from PT to address problems identified.    GOALS: Goals reviewed with patient? Yes  SHORT TERM GOALS: Target date: 05/25/2024  Independent in initial HEP  Baseline: Goal status: INITIAL  2.  Patient demonstrates appropriate standing posture without hyperextension of knees  Baseline:  Goal status: INITIAL  3.  Patient demonstrates ability to sit for 10 min with minimal to no increase in pain  Baseline:  Goal status: INITIAL  LONG TERM GOALS: Target date: 06/22/2024   Patient reports ability to stand for 20 min with minimal to no increase in pain for household chores and activities such as cooking and laundry Baseline:  Goal status: INITIAL  2.  Patient demonstrates proper bending techniques with UE assist as needed Baseline:  Goal status: INITIAL  3.  Patient demonstrates and/or verbalizes proper lifting techniques for > 20 pounds  Baseline:  Goal status: INITIAL  4.  Patient will ambulate for 10 in with minimal to no increase in pain for community distances such as grocery shopping  Baseline:  Goal status: INITIAL  5.  Increase PSFS: THE PATIENT SPECIFIC FUNCTIONAL SCALE score by 2 points   Place score of 0-10 (0 = unable to perform activity and 10 = able to perform activity at the same level as before injury or problem)  Activity Date: 04/27/24    Standing > 20  min 6    2. Bending  3    3. Lifting > 20 pounds  5    4. Walking > 10 min  5    Total Score 18      Total Score = Sum of activity scores/number of activities  18/4 = 4.5 Baseline: 4.5  Goal status: INITIAL  6.  Independent in advanced HEP  Baseline:  Goal status: INITIAL  PLAN:  PT FREQUENCY: 1-2x/week  PT DURATION: 8 weeks  PLANNED INTERVENTIONS: 97164- PT Re-evaluation,  97110-Therapeutic exercises, 97530- Therapeutic activity, 97112- Neuromuscular re-education, 97535- Self Care, 02859- Manual therapy, [phone]- Gait training, [phone]- Aquatic Therapy, Patient/Family education, Balance training, Stair training, Taping  PLAN FOR NEXT SESSION: review and progress exercises; continue with back care and ergonomic education and correction; manual work and modalities as indicated    Lamarr GORMAN Price, PTA 05/03/2024, 12:54 PM

## 2024-05-06 ENCOUNTER — Ambulatory Visit (INDEPENDENT_AMBULATORY_CARE_PROVIDER_SITE_OTHER): Admitting: Obstetrics and Gynecology

## 2024-05-06 VITALS — BP 113/74 | HR 75 | Wt 226.0 lb

## 2024-05-06 DIAGNOSIS — Z3A30 30 weeks gestation of pregnancy: Secondary | ICD-10-CM

## 2024-05-06 DIAGNOSIS — Z6841 Body Mass Index (BMI) 40.0 and over, adult: Secondary | ICD-10-CM

## 2024-05-06 DIAGNOSIS — Z3480 Encounter for supervision of other normal pregnancy, unspecified trimester: Secondary | ICD-10-CM

## 2024-05-06 DIAGNOSIS — O09299 Supervision of pregnancy with other poor reproductive or obstetric history, unspecified trimester: Secondary | ICD-10-CM

## 2024-05-06 DIAGNOSIS — O09293 Supervision of pregnancy with other poor reproductive or obstetric history, third trimester: Secondary | ICD-10-CM | POA: Diagnosis not present

## 2024-05-06 DIAGNOSIS — Z8759 Personal history of other complications of pregnancy, childbirth and the puerperium: Secondary | ICD-10-CM

## 2024-05-06 DIAGNOSIS — Z8679 Personal history of other diseases of the circulatory system: Secondary | ICD-10-CM

## 2024-05-06 DIAGNOSIS — I4711 Inappropriate sinus tachycardia, so stated: Secondary | ICD-10-CM

## 2024-05-06 NOTE — Progress Notes (Addendum)
   PRENATAL VISIT NOTE  Subjective:  Kimberly Lewis is a 28 y.o. G3P2002 at [redacted]w[redacted]d being seen today for ongoing prenatal care.  She is currently monitored for the following issues for this high-risk pregnancy and has Inappropriate sinus tachycardia (HCC); Papanicolaou smear of cervix with low grade squamous intraepithelial lesion (LGSIL); History of postpartum hypertension; Supervision of normal pregnancy; BMI 39.0-39.9,adult; Obesity affecting pregnancy, antepartum; and History of shoulder dystocia in prior pregnancy, currently pregnant on their problem list.  Patient reports back pain improving with PT. RLP, has not starting using support belt yet.  Contractions: Irritability. Vag. Bleeding: None.  Movement: Present. Denies leaking of fluid.   The following portions of the patient's history were reviewed and updated as appropriate: allergies, current medications, past family history, past medical history, past social history, past surgical history and problem list.   Objective:   Vitals:   05/06/24 1040  BP: 113/74  Pulse: 75  Weight: 226 lb (102.5 kg)    Fetal Status: Fetal Heart Rate (bpm): 151   Movement: Present     General:  Alert, oriented and cooperative. Patient is in no acute distress.  Skin: Skin is warm and dry. No rash noted.   Cardiovascular: Normal heart rate noted  Respiratory: Normal respiratory effort, no problems with respiration noted  Abdomen: Soft, gravid, appropriate for gestational age.  Pain/Pressure: Present      Assessment and Plan:  Pregnancy: G3P2002 at [redacted]w[redacted]d 1. Supervision of other normal pregnancy, antepartum (Primary) 2. [redacted] weeks gestation of pregnancy CBC not drawn last visit - will re-order Partner interested in vasectomy - info given. Pt unsure of his insurance.  3. BMI 40 4. History of postpartum hypertension Normal baseline labs Declines ldASA Serial growth US  - next scheduled for tomorrow 8/22 Weekly antenatal testing at 34  weeks Delivery around 39-40 weeks  5. History of shoulder dystocia in prior pregnancy, currently pregnant 8lb13oz; 30 seconds, NICU admisison  6. Inappropriate sinus tachycardia (HCC) No meds, normal HR today  Please refer to After Visit Summary for other counseling recommendations.   Return in about 2 weeks (around 05/20/2024) for return OB at 32 weeks.  Future Appointments  Date Time Provider Department Center  05/07/2024 11:00 AM [facility] PROVIDER 1 [facility]  05/07/2024 11:30 AM [facility] US[facility]  05/10/2024 11:00 AM Tiffany Lamarr RAMAN, PTA [facility]  05/13/2024 11:45 AM Tiffany Lamarr RAMAN, PTA [facility]  05/19/2024 11:00 AM Tiffany Lamarr RAMAN, PTA [facility]  05/24/2024 11:00 AM Tiffany Lamarr RAMAN, PTA [facility]  05/27/2024 11:00 AM Ina Florene SQUIBB, PT [facility]  05/31/2024 11:00 AM Tiffany Lamarr RAMAN, PTA [facility]  06/03/2024 11:00 AM Tiffany Lamarr RAMAN, PTA [facility]  06/04/2024  9:30 AM [facility] NURSE [facility]  06/04/2024  9:45 AM [facility] NST [facility]    Kieth JAYSON Carolin, MD

## 2024-05-06 NOTE — Patient Instructions (Addendum)
 Please confirm your partner's health insurance.   If he does not have insurance or has medicaid, Dr. Cresenzo with [facility] is able to do the vasectomy. We will need his name and date of birth to schedule the consultation. The cost of the vasectomy out-of-pocket for uninsured patients is estimated $98.80 for consult and $653.60 for the vasectomy. This is including the [facility] discount.  [facility] - if he has private insurance (non-Medicaid) through his employer, we refer our patients to this group [phone]

## 2024-05-07 ENCOUNTER — Other Ambulatory Visit: Payer: Self-pay | Admitting: *Deleted

## 2024-05-07 ENCOUNTER — Ambulatory Visit: Payer: Self-pay | Admitting: Obstetrics and Gynecology

## 2024-05-07 ENCOUNTER — Ambulatory Visit: Attending: Obstetrics | Admitting: Maternal & Fetal Medicine

## 2024-05-07 ENCOUNTER — Ambulatory Visit

## 2024-05-07 VITALS — BP 109/59 | HR 93

## 2024-05-07 DIAGNOSIS — O99213 Obesity complicating pregnancy, third trimester: Secondary | ICD-10-CM | POA: Diagnosis present

## 2024-05-07 DIAGNOSIS — Z362 Encounter for other antenatal screening follow-up: Secondary | ICD-10-CM | POA: Insufficient documentation

## 2024-05-07 DIAGNOSIS — O09293 Supervision of pregnancy with other poor reproductive or obstetric history, third trimester: Secondary | ICD-10-CM

## 2024-05-07 DIAGNOSIS — Z3A30 30 weeks gestation of pregnancy: Secondary | ICD-10-CM

## 2024-05-07 DIAGNOSIS — E669 Obesity, unspecified: Secondary | ICD-10-CM | POA: Diagnosis not present

## 2024-05-07 DIAGNOSIS — O99210 Obesity complicating pregnancy, unspecified trimester: Secondary | ICD-10-CM

## 2024-05-07 DIAGNOSIS — Z6839 Body mass index (BMI) 39.0-39.9, adult: Secondary | ICD-10-CM

## 2024-05-07 LAB — CBC
Hematocrit: 37.2 % (ref 34.0–46.6)
Hemoglobin: 12.1 g/dL (ref 11.1–15.9)
MCH: 29.5 pg (ref 26.6–33.0)
MCHC: 32.5 g/dL (ref 31.5–35.7)
MCV: 91 fL (ref 79–97)
Platelets: 266 x10E3/uL (ref 150–450)
RBC: 4.1 x10E6/uL (ref 3.77–5.28)
RDW: 13.2 % (ref 11.7–15.4)
WBC: 10 x10E3/uL (ref 3.4–10.8)

## 2024-05-07 NOTE — Progress Notes (Signed)
 Patient information  Patient Name: Kimberly Lewis  Patient MRN:   [id number]  Referring practice: MFM Referring Provider: [facility]  Problem List   Patient Active Problem List   Diagnosis Date Noted   History of shoulder dystocia in prior pregnancy, currently pregnant 03/22/2024   Obesity affecting pregnancy, antepartum 02/16/2024   BMI 39.0-39.9,adult 01/05/2024   Supervision of normal pregnancy 12/09/2023   History of postpartum hypertension 08/27/2022   Inappropriate sinus tachycardia (HCC) 02/04/2022   Papanicolaou smear of cervix with low grade squamous intraepithelial lesion (LGSIL) 07/05/2020    Maternal Fetal medicine Consult  Kimberly Lewis is a 28 y.o. G3P2002 at [redacted]w[redacted]d here for ultrasound and consultation. Kimberly Lewis is doing well today with no acute concerns. Today we focused on the following:   Patient is doing well today.  Due to her elevated BMI recommend a follow-up ultrasound every 4 weeks with NSTs weekly in between her ultrasounds starting after 34 weeks.  She has a history of a shoulder dystocia of an 8 pound 14 ounce baby.  This fetus is projected to be much Lewis.  I discussed that we cannot predict shoulder dystocia but it is much less likely at this pregnancy if the estimated fetal weight remains in this growth percentile.  I discussed that her NSTs should be done at her OB provider's office.  The patient had time to ask questions that were answered to her satisfaction.  She verbalized understanding and agrees to proceed with the plan below.  Sonographic findings Single intrauterine pregnancy at 29w 4d.  Fetal cardiac activity:  Observed and appears normal. Presentation: Cephalic. Interval fetal anatomy appears normal. Fetal biometry shows the estimated fetal weight at the 17 percentile. Amniotic fluid volume: Within normal limits. MVP: 5.91 cm. Placenta: Fundal.  There are limitations of prenatal ultrasound such as the  inability to detect certain abnormalities due to poor visualization. Various factors such as fetal position, gestational age and maternal body habitus may increase the difficulty in visualizing the fetal anatomy.    Recommendations -Serial growth ultrasounds every 4-6 weeks until delivery -Antenatal testing to start around 34 weeks  -Delivery around 39-[redacted] weeks gestation pending her clinical course  Review of Systems: A review of systems was performed and was negative except per HPI   Vitals and Physical Exam    05/07/2024   11:14 AM 05/06/2024   10:40 AM 04/19/2024    8:31 AM  Vitals with BMI  Weight  226 lbs 222 lbs  Systolic 109 113 871  Diastolic 59 74 70  Pulse 93 75 72    Sitting comfortably on the sonogram table Nonlabored breathing Normal rate and rhythm Abdomen is nontender  Past pregnancies OB History  Gravida Para Term Preterm AB Living  3 2 2  0 0 2  SAB IAB Ectopic Multiple Live Births  0 0 0 0 2    # Outcome Date GA Lbr Len/2nd Weight Sex Type Anes PTL Lv  3 Current           2 Term 08/26/22 [redacted]w[redacted]d 10:12 / 02:49 8 lb 13.1 oz (4 kg) M Vag-Spont EPI  LIV  1 Term 03/26/12   7 lb 1 oz (3.204 kg) M Vag-Spont   LIV    Obstetric Comments  2023 PP HTN     I spent 20 minutes reviewing the patients chart, including labs and images as well as counseling the patient about her medical conditions. Greater than 50% of the time was spent  in direct face-to-face patient counseling.  Kimberly Lewis  MFM, [facility]   05/07/2024  11:41 AM

## 2024-05-10 ENCOUNTER — Ambulatory Visit

## 2024-05-10 DIAGNOSIS — O26893 Other specified pregnancy related conditions, third trimester: Secondary | ICD-10-CM | POA: Diagnosis not present

## 2024-05-10 DIAGNOSIS — R29898 Other symptoms and signs involving the musculoskeletal system: Secondary | ICD-10-CM

## 2024-05-10 DIAGNOSIS — M5459 Other low back pain: Secondary | ICD-10-CM

## 2024-05-10 NOTE — Therapy (Signed)
 OUTPATIENT PHYSICAL THERAPY THORACOLUMBAR TREATMENT   Patient Name: Kimberly Lewis MRN: [id number] DOB:05/07/1996, 28 y.o., female Today's Date: 05/10/2024  END OF SESSION:  PT End of Session - 05/10/24 1107     Visit Number 4    Number of Visits 16    Date for PT Re-Evaluation 06/22/24    Authorization Type [employer] auth required copay $4    PT Start Time 1106    PT Stop Time 1148    PT Time Calculation (min) 42 min    Activity Tolerance Patient tolerated treatment well    Behavior During Therapy [facility] for tasks assessed/performed         Past Medical History:  Diagnosis Date   Depression    Fibroids    Inappropriate sinus tachycardia (HCC)    PCOS (polycystic ovarian syndrome)    Posttraumatic stress disorder 12/14/2015   Trichomonas infection 2017   UTI (urinary tract infection)    Past Surgical History:  Procedure Laterality Date   CHOLECYSTECTOMY     WISDOM TOOTH EXTRACTION Bilateral    Patient Active Problem List   Diagnosis Date Noted   History of shoulder dystocia in prior pregnancy, currently pregnant 03/22/2024   Obesity affecting pregnancy, antepartum 02/16/2024   BMI 39.0-39.9,adult 01/05/2024   Supervision of normal pregnancy 12/09/2023   History of postpartum hypertension 08/27/2022   Inappropriate sinus tachycardia (HCC) 02/04/2022   Papanicolaou smear of cervix with low grade squamous intraepithelial lesion (LGSIL) 07/05/2020    PCP: No PCP  REFERRING PROVIDER: Dr Burnard VEAR Pate  REFERRING DIAG: Back pain in pregnancy - third trimester   Rationale for Evaluation and Treatment: Rehabilitation  THERAPY DIAG:  Other low back pain  Other symptoms and signs involving the musculoskeletal system  ONSET DATE: 12/30/23  SUBJECTIVE:                                                                                                                                                                                           SUBJECTIVE STATEMENT: Patient  reports she started walking since last PT visit; states she felt good after last visit. Patient states 4/10 pain today across low back.   Eval: Patient reports that she has had back pain in the past 3- 4 months. She has pain in the spot where she had epidurals in the past two pregnancies 08/26/22 and 03/26/2012. She feels that she had damage to LB when they did the past epidural. She notices gradual onset of LBP in the past 4 months with pain increasing as the pregnancy has progressed. Now [redacted] weeks pregnant   PERTINENT HISTORY:  LBP since the first pregnancy;  gall bladder surgery 2024; history of sinus tachycardia with no medication; bilat knee pain     PAIN:  Are you having pain? Yes: NPRS scale: 4/10 Pain location: bilat LBP Pain description: constant; sometimes aching; sometimes intense  Aggravating factors: any movement and at rest - standing and bending  Relieving factors: pregnancy belt that supports belly  PRECAUTIONS: Other: third trimester pregnancy  WEIGHT BEARING RESTRICTIONS: No  FALLS:  Has patient fallen in last 6 months? Yes. Number of falls 1 no injury dropped to knees   LIVING ENVIRONMENT: Lives with: lives with their family Lives in: House/apartment Stairs: Yes: External: 4 steps; on left going up Has following equipment at home: None  OCCUPATION: not working - worked until 5 months pregnancy on feet for ~ 10 hour days    PATIENT GOALS: help get rid of pain   NEXT MD VISIT: OB 05/06/24  OBJECTIVE:  Note: Objective measures were completed at Evaluation unless otherwise noted.  DIAGNOSTIC FINDINGS:  None for lumbar spine   PATIENT SURVEYS:  PSFS: THE PATIENT SPECIFIC FUNCTIONAL SCALE  Place score of 0-10 (0 = unable to perform activity and 10 = able to perform activity at the same level as before injury or problem)  Activity Date: 04/27/24    Standing > 20 min 6    2. Bending  3    3. Lifting > 20 pounds  5    4. Walking > 10 min  5    Total Score 18       Total Score = Sum of activity scores/number of activities  18/4 = 4.5  Minimally Detectable Change: 3 points (for single activity); 2 points (for average score)  [facility] (nd). The Patient Specific Functional Scale . Retrieved from [email or web address]     SENSATION: WFL  POSTURE: rounded shoulders, forward head, increased lumbar lordosis, increased thoracic kyphosis, and flexed trunk - stands with bilat knees hyperextended   PALPATION: Tightness bilat lumbar musculature   LUMBAR ROM:   AROM eval  Flexion 70% pull; pain return to stand    Extension 25% pain   Right lateral flexion 90%   Left lateral flexion 90% pull R; round ligament   Right rotation   Left rotation    (Blank rows = not tested)  LOWER EXTREMITY ROM:  WFL's   Active  Right eval Left eval  Hip flexion    Hip extension    Hip abduction    Hip adduction    Hip internal rotation    Hip external rotation    Knee flexion    Knee extension    Ankle dorsiflexion    Ankle plantarflexion    Ankle inversion    Ankle eversion     (Blank rows = not tested)  LOWER EXTREMITY MMT:  functional weakness in knee extension - standing with knees hyperextended   MMT Right eval Left eval  Hip flexion    Hip extension    Hip abduction    Hip adduction    Hip internal rotation    Hip external rotation    Knee flexion    Knee extension    Ankle dorsiflexion    Ankle plantarflexion    Ankle inversion    Ankle eversion     (Blank rows = not tested)  GAIT: Distance walked: 40 feet Assistive device utilized: None Level of assistance: Complete Independence Comments: wide based gait; knees hyperextended    [facility] Adult PT Treatment:  DATE: 05/10/2024 Neuromuscular re-ed: Quadruped with feet on wall --> HS activation + breathing & TA activation S/L with back against wall --> clamshells + green TB  2x10 S/L with back leg supported in foam roller: Bent knee hip abd 2x10 Reverse clamshells  Seated SLR 2x10x5 --> knee slightly bent Therapeutic Activity: Seated on red PB --> posterior pelvic tilts Self Care: Modifying to carrying toddler on back instead of on side hip Recommendations for back carriers   [facility] Adult PT Treatment:                                                DATE: 05/03/2024 Neuromuscular re-ed: Side Lying over green bolster (propped on elbows): Clamshells + green TB (bil) --> anterior hip ligament pain on Lt Bent knee hip abd in neutral --> leg propped on foam roller Reverse clamshell --> leg propped on foam roller Seated SLR 2x10x5 --> knee slightly bent Seated thoracic extension with yoga roll horizontal at back + towel for lumbar support/cue Therapeutic Activity: Quadruped: Cat with focus on lumbar flexion mobility Cat + yoga block stabilization b/w knees Seated on dynadisc --> A/P pelvic tilts Self Care: Pregnancy pillow Sitting with towel roll along spine for postural support Folded towel for lumbar support   [facility] Adult PT Treatment:                                                DATE: 04/29/24 Therapeutic Exercise: Sitting on red therapeutic ball rolling up/back Sitting on red therapeutic ball for posterior pelvic tilt  Sitting on red ball for posterior pelvic tilt   Sitting posterior pelvic tilt  Neuromuscular re-ed: Sitting pelvic floor activation 10 sec x 10  Scap squeeze sitting with noodle 3 sec x 10  Sitting scap squeeze red TB 3 sec x 10  Sitting scap squeeze with W red TB 3 sec x 10  Backward shoulder rolls  Lateral cervical flexion 5 sec x 3 R/L  Therapeutic Activity: Rolling red therapeutic ball forward x 10  Rolling red therapeutic ball forward and then side to side x 10  Deep breath in raising arms overhead, breathe in pause then bring arms down as you breathe out opening/lifting ribs  Self Care: Sitting with noodle along spine                                                                                                                             PATIENT EDUCATION:  Education details: Updated HEP  Person educated: Patient Education method: Explanation, Demonstration, Tactile cues, Verbal cues, and Handouts Education comprehension: verbalized understanding, returned demonstration, verbal cues required, tactile cues required, and needs further education  HOME EXERCISE PROGRAM: Access  Code: [email or web address] URL: [email or web address] Date: 05/03/2024 Prepared by: Lamarr Price  Exercises - Seated Flexion Stretch with Swiss Ball  - 2 x daily - 7 x weekly - 1 sets - 3 reps - 30 sec  hold - Cat Cow  - 2 x daily - 7 x weekly - 1 sets - 5-10 reps - 3-5 sec  hold - Seated Pelvic Floor Contraction  - 2 x daily - 7 x weekly - 1 sets - 10 reps - 10 sec  hold - Pelvic Tilt on Swiss Ball  - 2 x daily - 7 x weekly - 1 sets - Seated Cervical Sidebending AROM  - 2 x daily - 7 x weekly - 1 sets - 3-5 reps - 5-10 sec  hold - Seated Scapular Retraction  - 2 x daily - 7 x weekly - 1-2 sets - 10 reps - 10 sec  hold - Shoulder External Rotation and Scapular Retraction with Resistance  - 2 x daily - 7 x weekly - 1 sets - 10 reps - 3-5 sec  hold - Shoulder W - External Rotation with Resistance  - 2 x daily - 7 x weekly - 1-2 sets - 10 reps - 3 sec  hold - Standing Backward Shoulder Rolls  - 2 x daily - 7 x weekly - 1 sets - 10 reps - 1-2 sec  hold - Sidelying Reverse Clamshell  - 1 x daily - 7 x weekly - 1-3 sets - 10 reps - Sidelying Bent Knee Lift at 45 Degrees  - 1 x daily - 7 x weekly - 1-3 sets - 10 reps - Seated Active Straight-Leg Raise  - 1 x daily - 7 x weekly - 3 sets - 10 reps - 5 sec hold  ASSESSMENT:  CLINICAL IMPRESSION: Wall utilized to provide tactile feedback and lumbo pelvic stabilization during side lying hip abd/rotation variations. Discussion with patient on carrying toddler on back versus side hip;  provided carrier recommendations. Recommended patient exercise/walk in pool if accessible for pain management and walking program.   Eval: Patient is a 28 y.o. female who was seen today for physical therapy evaluation and treatment for back pain in third trimester of pregnancy. She presents with limited trunk ROM/mobility; muscular tightness through lumbar spine; LBP which is constant in nature; limited functional activity level due to back pain. Patient stands with bilat knees in hyperextended. Patient will benefit from PT to address problems identified.    GOALS: Goals reviewed with patient? Yes  SHORT TERM GOALS: Target date: 05/25/2024  Independent in initial HEP  Baseline: Goal status: INITIAL  2.  Patient demonstrates appropriate standing posture without hyperextension of knees  Baseline:  Goal status: INITIAL  3.  Patient demonstrates ability to sit for 10 min with minimal to no increase in pain  Baseline:  Goal status: INITIAL  LONG TERM GOALS: Target date: 06/22/2024   Patient reports ability to stand for 20 min with minimal to no increase in pain for household chores and activities such as cooking and laundry Baseline:  Goal status: INITIAL  2.  Patient demonstrates proper bending techniques with UE assist as needed Baseline:  Goal status: INITIAL  3.  Patient demonstrates and/or verbalizes proper lifting techniques for > 20 pounds  Baseline:  Goal status: INITIAL  4.  Patient will ambulate for 10 in with minimal to no increase in pain for community distances such as grocery shopping  Baseline:  Goal status: INITIAL  5.  Increase PSFS: THE PATIENT  SPECIFIC FUNCTIONAL SCALE score by 2 points   Place score of 0-10 (0 = unable to perform activity and 10 = able to perform activity at the same level as before injury or problem)  Activity Date: 04/27/24    Standing > 20 min 6    2. Bending  3    3. Lifting > 20 pounds  5    4. Walking > 10 min  5    Total Score 18       Total Score = Sum of activity scores/number of activities  18/4 = 4.5 Baseline: 4.5  Goal status: INITIAL  6.  Independent in advanced HEP  Baseline:  Goal status: INITIAL  PLAN:  PT FREQUENCY: 1-2x/week  PT DURATION: 8 weeks  PLANNED INTERVENTIONS: 97164- PT Re-evaluation, 97110-Therapeutic exercises, 97530- Therapeutic activity, [id number]- Neuromuscular re-education, 97535- Self Care, 02859- Manual therapy, [phone]- Gait training, [phone]- Aquatic Therapy, Patient/Family education, Balance training, Stair training, Taping  PLAN FOR NEXT SESSION: review and progress exercises; hip and glute strengthening, posterior pelvic tilts/lumbar flexion AROM, quad strengthening, lifting/carrying mechanics.   Lamarr GORMAN Price, PTA 05/10/2024, 11:57 AM

## 2024-05-13 ENCOUNTER — Ambulatory Visit

## 2024-05-14 ENCOUNTER — Other Ambulatory Visit: Payer: Self-pay

## 2024-05-14 ENCOUNTER — Encounter: Admitting: Rehabilitative and Restorative Service Providers"

## 2024-05-14 ENCOUNTER — Inpatient Hospital Stay (HOSPITAL_COMMUNITY)
Admission: AD | Admit: 2024-05-14 | Discharge: 2024-05-14 | Disposition: A | Discharge status: Home / Self Care | Attending: Obstetrics and Gynecology | Admitting: Obstetrics and Gynecology

## 2024-05-14 ENCOUNTER — Encounter (HOSPITAL_COMMUNITY): Payer: Self-pay | Admitting: Obstetrics and Gynecology

## 2024-05-14 ENCOUNTER — Inpatient Hospital Stay (HOSPITAL_COMMUNITY)

## 2024-05-14 DIAGNOSIS — W1839XA Other fall on same level, initial encounter: Secondary | ICD-10-CM | POA: Insufficient documentation

## 2024-05-14 DIAGNOSIS — Y92009 Unspecified place in unspecified non-institutional (private) residence as the place of occurrence of the external cause: Secondary | ICD-10-CM

## 2024-05-14 DIAGNOSIS — Z3A31 31 weeks gestation of pregnancy: Secondary | ICD-10-CM | POA: Diagnosis not present

## 2024-05-14 DIAGNOSIS — S99912A Unspecified injury of left ankle, initial encounter: Secondary | ICD-10-CM | POA: Diagnosis not present

## 2024-05-14 DIAGNOSIS — O9A213 Injury, poisoning and certain other consequences of external causes complicating pregnancy, third trimester: Secondary | ICD-10-CM | POA: Diagnosis present

## 2024-05-14 DIAGNOSIS — O99213 Obesity complicating pregnancy, third trimester: Secondary | ICD-10-CM | POA: Diagnosis not present

## 2024-05-14 DIAGNOSIS — O09293 Supervision of pregnancy with other poor reproductive or obstetric history, third trimester: Secondary | ICD-10-CM | POA: Insufficient documentation

## 2024-05-14 DIAGNOSIS — O99210 Obesity complicating pregnancy, unspecified trimester: Secondary | ICD-10-CM | POA: Diagnosis not present

## 2024-05-14 DIAGNOSIS — M25572 Pain in left ankle and joints of left foot: Secondary | ICD-10-CM

## 2024-05-14 MED ORDER — ACETAMINOPHEN 500 MG PO TABS
1000.0000 mg | ORAL_TABLET | Freq: Once | ORAL | Status: AC
  Administered 2024-05-14: 1000 mg via ORAL
  Filled 2024-05-14: qty 2

## 2024-05-14 NOTE — Discharge Instructions (Signed)
 To help with the swelling in your left ankle, you can use 2 buckets -1 with ice water and 1 with warm water.  Please start and end this process with the ice water.  You will submerge your foot and ankle into each bucket of water  5 minutes at a time.  The cold water helps with inflammation and the warm water helps with circulation.

## 2024-05-14 NOTE — MAU Note (Signed)
 Kimberly Lewis is a 28 y.o. at [redacted]w[redacted]d here in MAU reporting: she fell at about 0930 this morning.  Reports left ankle rolled and she fell forward and landed onto knees.  States unsure if she struck abdomen.  Denies VB and LOF.  Endorses +FM since fall, reports not much.   LMP: 10/06/2023 Onset of complaint: today Pain score: 9 Vitals:   05/14/24 1150  BP: (!) 118/59  Pulse: 76  Resp: 18  Temp: 97.7 F (36.5 C)  SpO2: 100%     FHT: 135 bpm  Lab orders placed from triage: None

## 2024-05-14 NOTE — Progress Notes (Signed)
 Left foot elevated with pillow and given ice pack for ankle.

## 2024-05-14 NOTE — MAU Provider Note (Signed)
 Chief Complaint:  Fall   HPI   Kimberly Lewis is a 28 y.o. G3P2002 at [redacted]w[redacted]d who presents to maternity admissions reporting a fall.  Around 9:30 AM, she mentions that she was on flat ground when her left ankle rolled.  She then fell onto both of her knees and her right arm.  She does not believe that she hit her abdomen on the ground.  She is now describing pain in her left ankle and foot, with difficulty weightbearing, scoring the pain as 9 out of 10 on the pain scale.  She denies any contractions, leakage of fluid, vaginal bleeding, or decreased fetal movement.  Pregnancy Course:  She currently received prenatal care at [facility].  Pregnancy is complicated by history of postpartum hypertension, and history of shoulder dystocia.  Past Medical History:  Diagnosis Date   Depression    Fibroids    Inappropriate sinus tachycardia (HCC)    PCOS (polycystic ovarian syndrome)    Posttraumatic stress disorder 12/14/2015   Trichomonas infection 2017   UTI (urinary tract infection)    OB History  Gravida Para Term Preterm AB Living  3 2 2  0 0 2  SAB IAB Ectopic Multiple Live Births  0 0 0 0 2    # Outcome Date GA Lbr Len/2nd Weight Sex Type Anes PTL Lv  3 Current           2 Term 08/26/22 [redacted]w[redacted]d 10:12 / 02:49 4000 g M Vag-Spont EPI  LIV  1 Term 03/26/12   3204 g M Vag-Spont   LIV    Obstetric Comments  2023 PP HTN   Past Surgical History:  Procedure Laterality Date   CHOLECYSTECTOMY     WISDOM TOOTH EXTRACTION Bilateral    Family History  Problem Relation Age of Onset   Healthy Mother    Other Father        unknown   Hypertension Maternal Grandmother    Cancer Maternal Grandfather    Cancer Paternal Grandmother    Diabetes Other    Asthma Neg Hx    Heart disease Neg Hx    Stroke Neg Hx    Social History   Tobacco Use   Smoking status: Former    Types: Cigarettes    Passive exposure: Past  Vaping Use   Vaping status: Never Used  Substance Use Topics    Alcohol use: Not Currently   Drug use: Not Currently   Allergies  Allergen Reactions   Aspirin  Nausea And Vomiting    abd pain and vomiting abd pain and vomiting    Amoxicillin Itching and Swelling    Per patient, caused vaginal itching and swelling   No medications prior to admission.    I have reviewed patient's Past Medical Hx, Surgical Hx, Family Hx, Social Hx, medications and allergies.   ROS  Pertinent items noted in HPI and remainder of comprehensive ROS otherwise negative.   PHYSICAL EXAM  Patient Vitals for the past 24 hrs:  BP Temp Temp src Pulse Resp SpO2  05/14/24 1205 122/62 -- -- 76 -- --  05/14/24 1150 (!) 118/59 97.7 F (36.5 C) Oral 76 18 100 %    Constitutional: Well-developed, well-nourished female in no acute distress.  Cardiovascular: normal rate & rhythm, warm and well-perfused Respiratory: normal effort, no problems with respiration noted GI: Abd soft, non-tender, non-distended MS: Extremities nontender, swelling in L foot and ankle, TTP lateral ankle and 5th metatarsal Neurologic: Alert and oriented x 4.  GU: no  CVA tenderness Pelvic: deferred     Fetal Tracing: Baseline: 140 bpm Variability: moderate Accelerations: present Decelerations: absent Toco: flat   Labs: No results found for this or any previous visit (from the past 24 hours).  Imaging:  DG Foot Complete Left Result Date: 05/14/2024 CLINICAL DATA:  Left foot pain after fall today. EXAM: LEFT FOOT - COMPLETE 3+ VIEW COMPARISON:  None Available. FINDINGS: There is no evidence of fracture or dislocation. There is no evidence of arthropathy or other focal bone abnormality. Soft tissues are unremarkable. IMPRESSION: Negative. Electronically Signed   By: Lynwood Landy Raddle M.D.   On: 05/14/2024 13:21   DG Ankle 2 Views Left Result Date: 05/14/2024 CLINICAL DATA:  Left ankle pain after fall today. EXAM: LEFT ANKLE - 2 VIEW COMPARISON:  None Available. FINDINGS: There is no evidence of  fracture, dislocation, or joint effusion. There is no evidence of arthropathy or other focal bone abnormality. Soft tissues are unremarkable. IMPRESSION: Negative. Electronically Signed   By: Lynwood Landy Raddle M.D.   On: 05/14/2024 13:20    MDM & MAU COURSE  MDM: Moderate  MAU Course: Orders Placed This Encounter  Procedures   DG Ankle 2 Views Left   DG Foot Complete Left   Apply ace wrap   Discharge patient   Meds ordered this encounter  Medications   acetaminophen  (TYLENOL ) tablet 1,000 mg   Presenting after a fall on flat ground with no abdominal trauma.  Vital signs stable, exam remarkable for point tenderness on fifth left metatarsal and lateral left ankle.  Left foot and ankle x-rays negative for fracture, dislocation, or bony abnormality.  Given fall, EFM for 4 hours with no concerns.  Left foot and ankle wrapped in Ace bandage by Ortho tech.  Tylenol  provided for pain.  Discussed RICE for conservative treatment of left ankle strain.  Next appointment 9/5 with OB.  All questions answered, patient expressed understanding.  ASSESSMENT   1. [redacted] weeks gestation of pregnancy   2. Acute left ankle pain   3. Fall in home, initial encounter     PLAN  Discharge home in stable condition with return precautions.      Allergies as of 05/14/2024       Reactions   Aspirin  Nausea And Vomiting   abd pain and vomiting abd pain and vomiting   Amoxicillin Itching, Swelling   Per patient, caused vaginal itching and swelling        Medication List     STOP taking these medications    aspirin  EC 81 MG tablet       TAKE these medications    famotidine  20 MG tablet Commonly known as: Pepcid  Take 1 tablet (20 mg total) by mouth 2 (two) times daily.   PrePLUS 27-1 MG Tabs Take 1 tablet by mouth daily.        Charlie Courts, MD  Family Medicine - Obstetrics Fellow

## 2024-05-18 NOTE — Progress Notes (Unsigned)
   PRENATAL VISIT NOTE  Subjective:  Kimberly Lewis is a 28 y.o. G3P2002 at [redacted]w[redacted]d being seen today for ongoing prenatal care.  She is currently monitored for the following issues for this {Blank single:19197::high-risk,low-risk} pregnancy and has Inappropriate sinus tachycardia (HCC); Papanicolaou smear of cervix with low grade squamous intraepithelial lesion (LGSIL); History of postpartum hypertension; Supervision of normal pregnancy; BMI 39.0-39.9,adult; Obesity affecting pregnancy, antepartum; and History of shoulder dystocia in prior pregnancy, currently pregnant on their problem list.  Patient reports {sx:14538}.   .  .   . Denies leaking of fluid.   The following portions of the patient's history were reviewed and updated as appropriate: allergies, current medications, past family history, past medical history, past social history, past surgical history and problem list.   Objective:    There were no vitals filed for this visit.  Fetal Status:           General: Alert, oriented and cooperative. Patient is in no acute distress.  Skin: Skin is warm and dry. No rash noted.   Cardiovascular: Normal heart rate noted  Respiratory: Normal respiratory effort, no problems with respiration noted  Abdomen: Soft, gravid, appropriate for gestational age.        Pelvic: {Blank single:19197::Cervical exam performed in the presence of a chaperone,Cervical exam deferred}        Extremities: Normal range of motion.     Mental Status: Normal mood and affect. Normal behavior. Normal judgment and thought content.   Assessment and Plan:  Pregnancy: G3P2002 at [redacted]w[redacted]d 1. Supervision of other normal pregnancy, antepartum (Primary) ***  2. [redacted] weeks gestation of pregnancy ***  3. History of shoulder dystocia in prior pregnancy, currently pregnant ***  {Blank single:19197::Term,Preterm} labor symptoms and general obstetric precautions including but not limited to vaginal bleeding,  contractions, leaking of fluid and fetal movement were reviewed in detail with the patient. Please refer to After Visit Summary for other counseling recommendations.   No follow-ups on file.  Future Appointments  Date Time Provider Department Center  05/19/2024 11:00 AM Tiffany Lamarr RAMAN, [place] [facility]  05/21/2024  9:10 AM Regino Camie LABOR, CNM [facility]  05/24/2024 11:00 AM Tiffany Lamarr RAMAN, PTA [facility]  05/27/2024 11:00 AM Ina Florene SQUIBB, PT [facility]  05/31/2024 11:00 AM Tiffany Lamarr RAMAN, PTA [facility]  06/02/2024 10:10 AM Cleatus Moccasin, MD [facility]  06/03/2024 11:00 AM Tiffany Lamarr RAMAN, PTA [facility]  06/04/2024  9:30 AM [facility] NURSE [facility]  06/04/2024  9:45 AM [facility] NST [facility]  06/07/2024 11:15 AM [facility] PROVIDER 1 [facility]  06/07/2024 11:30 AM [facility] US5 [facility]  07/02/2024 11:00 AM [facility] PROVIDER 1 [facility]  07/02/2024 11:30 AM [facility] US2 [facility]    Camie LABOR Regino, CNM

## 2024-05-19 ENCOUNTER — Ambulatory Visit: Attending: Obstetrics & Gynecology

## 2024-05-19 DIAGNOSIS — M5459 Other low back pain: Secondary | ICD-10-CM | POA: Diagnosis present

## 2024-05-19 DIAGNOSIS — R29898 Other symptoms and signs involving the musculoskeletal system: Secondary | ICD-10-CM | POA: Insufficient documentation

## 2024-05-19 NOTE — Therapy (Signed)
 OUTPATIENT PHYSICAL THERAPY THORACOLUMBAR TREATMENT   Patient Name: Kimberly Lewis MRN: [id number] DOB:28-Feb-1996, 28 y.o., female Today's Date: 05/19/2024  END OF SESSION:  PT End of Session - 05/19/24 1103     Visit Number 5    Number of Visits 16    Date for PT Re-Evaluation 06/22/24    Authorization Type [employer] auth required copay $4    PT Start Time 1104    PT Stop Time 1146    PT Time Calculation (min) 42 min    Activity Tolerance Patient tolerated treatment well    Behavior During Therapy [facility] for tasks assessed/performed         Past Medical History:  Diagnosis Date   Depression    Fibroids    Inappropriate sinus tachycardia (HCC)    PCOS (polycystic ovarian syndrome)    Posttraumatic stress disorder 12/14/2015   Trichomonas infection 2017   UTI (urinary tract infection)    Past Surgical History:  Procedure Laterality Date   CHOLECYSTECTOMY     WISDOM TOOTH EXTRACTION Bilateral    Patient Active Problem List   Diagnosis Date Noted   History of shoulder dystocia in prior pregnancy, currently pregnant 03/22/2024   Obesity affecting pregnancy, antepartum 02/16/2024   BMI 39.0-39.9,adult 01/05/2024   Supervision of normal pregnancy 12/09/2023   History of postpartum hypertension 08/27/2022   Inappropriate sinus tachycardia (HCC) 02/04/2022   Papanicolaou smear of cervix with low grade squamous intraepithelial lesion (LGSIL) 07/05/2020    PCP: No PCP  REFERRING PROVIDER: Dr Burnard VEAR Pate  REFERRING DIAG: Back pain in pregnancy - third trimester   Rationale for Evaluation and Treatment: Rehabilitation  THERAPY DIAG:  Other low back pain  Other symptoms and signs involving the musculoskeletal system  ONSET DATE: 12/30/23  SUBJECTIVE:                                                                                                                                                                                           SUBJECTIVE STATEMENT: Patient  reports had a fall where she rolled her ankle when leaving the house on Friday; states she is feeling better after resting for the weekend and is able to walk on it with minimal pain. Patent states her back is feeling very uncomfortable, states she has been having lightning crotch.   Eval: Patient reports that she has had back pain in the past 3- 4 months. She has pain in the spot where she had epidurals in the past two pregnancies 08/26/22 and 03/26/2012. She feels that she had damage to LB when they did the past epidural. She notices gradual onset of LBP  in the past 4 months with pain increasing as the pregnancy has progressed. Now [redacted] weeks pregnant   PERTINENT HISTORY:  LBP since the first pregnancy; gall bladder surgery 2024; history of sinus tachycardia with no medication; bilat knee pain     PAIN:  Are you having pain? Yes: NPRS scale: 4/10 Pain location: bilat LBP Pain description: constant; sometimes aching; sometimes intense  Aggravating factors: any movement and at rest - standing and bending  Relieving factors: pregnancy belt that supports belly  PRECAUTIONS: Other: third trimester pregnancy  WEIGHT BEARING RESTRICTIONS: No  FALLS:  Has patient fallen in last 6 months? Yes. Number of falls 1 no injury dropped to knees   LIVING ENVIRONMENT: Lives with: lives with their family Lives in: House/apartment Stairs: Yes: External: 4 steps; on left going up Has following equipment at home: None  OCCUPATION: not working - worked until 5 months pregnancy on feet for ~ 10 hour days    PATIENT GOALS: help get rid of pain   NEXT MD VISIT: OB 05/21/24  OBJECTIVE:  Note: Objective measures were completed at Evaluation unless otherwise noted.  DIAGNOSTIC FINDINGS:  None for lumbar spine   PATIENT SURVEYS:  PSFS: THE PATIENT SPECIFIC FUNCTIONAL SCALE  Place score of 0-10 (0 = unable to perform activity and 10 = able to perform activity at the same level as before injury or  problem)  Activity Date: 04/27/24    Standing > 20 min 6    2. Bending  3    3. Lifting > 20 pounds  5    4. Walking > 10 min  5    Total Score 18      Total Score = Sum of activity scores/number of activities  18/4 = 4.5  Minimally Detectable Change: 3 points (for single activity); 2 points (for average score)  [facility] (nd). The Patient Specific Functional Scale . Retrieved from [email or web address].com.pt     SENSATION: WFL  POSTURE: rounded shoulders, forward head, increased lumbar lordosis, increased thoracic kyphosis, and flexed trunk - stands with bilat knees hyperextended   PALPATION: Tightness bilat lumbar musculature   LUMBAR ROM:   AROM eval  Flexion 70% pull; pain return to stand    Extension 25% pain   Right lateral flexion 90%   Left lateral flexion 90% pull R; round ligament   Right rotation   Left rotation    (Blank rows = not tested)  LOWER EXTREMITY ROM:  WFL's   Active  Right eval Left eval  Hip flexion    Hip extension    Hip abduction    Hip adduction    Hip internal rotation    Hip external rotation    Knee flexion    Knee extension    Ankle dorsiflexion    Ankle plantarflexion    Ankle inversion    Ankle eversion     (Blank rows = not tested)  LOWER EXTREMITY MMT:  functional weakness in knee extension - standing with knees hyperextended   MMT Right eval Left eval  Hip flexion    Hip extension    Hip abduction    Hip adduction    Hip internal rotation    Hip external rotation    Knee flexion    Knee extension    Ankle dorsiflexion    Ankle plantarflexion    Ankle inversion    Ankle eversion     (Blank rows = not tested)  GAIT: Distance walked: 40 feet Assistive  device utilized: None Level of assistance: Complete Independence Comments: wide based gait; knees hyperextended    [facility] Adult PT Treatment:                                                DATE:  05/19/2024 Neuromuscular re-ed: Seated on red PB: Shoulder extension press down + red TB 2x10 Rows + green TB 2x10 Paloff press + green TB x12 Seated:  SLR 2x10x5 --> knee slightly bent Hip abd isometric press with gait belt 2x10x5 Bilateral hip abd with sliders + green TB around thighs & yellow Tb around ankles PF activation (elevator/pencil lift) Therapeutic Activity: Seated on red PB: Pelvic tilts Pelvic rocking Pelvic circles Hip hinge mini squat --> added PF acitvation   [facility] Adult PT Treatment:                                                DATE: 05/10/2024 Neuromuscular re-ed: Quadruped with feet on wall --> HS activation + breathing & TA activation S/L with back against wall --> clamshells + green TB 2x10 S/L with back leg supported in foam roller: Bent knee hip abd 2x10 Reverse clamshells  Seated SLR 2x10x5 --> knee slightly bent Therapeutic Activity: Seated on red PB --> posterior pelvic tilts Self Care: Modifying to carrying toddler on back instead of on side hip Recommendations for back carriers   [facility] Adult PT Treatment:                                                DATE: 05/03/2024 Neuromuscular re-ed: Side Lying over green bolster (propped on elbows): Clamshells + green TB (bil) --> anterior hip ligament pain on Lt Bent knee hip abd in neutral --> leg propped on foam roller Reverse clamshell --> leg propped on foam roller Seated SLR 2x10x5 --> knee slightly bent Seated thoracic extension with yoga roll horizontal at back + towel for lumbar support/cue Therapeutic Activity: Quadruped: Cat with focus on lumbar flexion mobility Cat + yoga block stabilization b/w knees Seated on dynadisc --> A/P pelvic tilts Self Care: Pregnancy pillow Sitting with towel roll along spine for postural support Folded towel for lumbar support               PATIENT EDUCATION:  Education details: Updated HEP  Person educated: Patient Education method: Explanation,  Demonstration, Tactile cues, Verbal cues, and Handouts Education comprehension: verbalized understanding, returned demonstration, verbal cues required, tactile cues required, and needs further education  HOME EXERCISE PROGRAM: Access Code: [email or web address] URL: [email or web address] Date: 05/03/2024 Prepared by: Lamarr Price  Exercises - Seated Flexion Stretch with Swiss Ball  - 2 x daily - 7 x weekly - 1 sets - 3 reps - 30 sec  hold - Cat Cow  - 2 x daily - 7 x weekly - 1 sets - 5-10 reps - 3-5 sec  hold - Seated Pelvic Floor Contraction  - 2 x daily - 7 x weekly - 1 sets - 10 reps - 10 sec  hold - Pelvic Tilt on Swiss Ball  - 2 x daily - 7 x  weekly - 1 sets - Seated Cervical Sidebending AROM  - 2 x daily - 7 x weekly - 1 sets - 3-5 reps - 5-10 sec  hold - Seated Scapular Retraction  - 2 x daily - 7 x weekly - 1-2 sets - 10 reps - 10 sec  hold - Shoulder External Rotation and Scapular Retraction with Resistance  - 2 x daily - 7 x weekly - 1 sets - 10 reps - 3-5 sec  hold - Shoulder W - External Rotation with Resistance  - 2 x daily - 7 x weekly - 1-2 sets - 10 reps - 3 sec  hold - Standing Backward Shoulder Rolls  - 2 x daily - 7 x weekly - 1 sets - 10 reps - 1-2 sec  hold - Sidelying Reverse Clamshell  - 1 x daily - 7 x weekly - 1-3 sets - 10 reps - Sidelying Bent Knee Lift at 45 Degrees  - 1 x daily - 7 x weekly - 1-3 sets - 10 reps - Seated Active Straight-Leg Raise  - 1 x daily - 7 x weekly - 3 sets - 10 reps - 5 sec hold   ASSESSMENT:  CLINICAL IMPRESSION: Patient unable to tolerate LE exercises in supported side lying dur to increased pain at pubic symphysis; patient reports pain had increased since fall last Friday. Patient able to complete hip abduction variations on sitting well with no exacerbation of pubis pain. Deferred side lying clamshells/abduction in side lying with HEP due to pubis pain. Pelvic floor activation incorporated with hip hinge mini squats; cue for  self-tactile support of abdomen alleviated low back pain/discomfort.  Eval: Patient is a 28 y.o. female who was seen today for physical therapy evaluation and treatment for back pain in third trimester of pregnancy. She presents with limited trunk ROM/mobility; muscular tightness through lumbar spine; LBP which is constant in nature; limited functional activity level due to back pain. Patient stands with bilat knees in hyperextended. Patient will benefit from PT to address problems identified.    GOALS: Goals reviewed with patient? Yes  SHORT TERM GOALS: Target date: 05/25/2024  Independent in initial HEP  Baseline: Goal status: INITIAL  2.  Patient demonstrates appropriate standing posture without hyperextension of knees  Baseline:  Goal status: INITIAL  3.  Patient demonstrates ability to sit for 10 min with minimal to no increase in pain  Baseline:  Goal status: INITIAL  LONG TERM GOALS: Target date: 06/22/2024  Patient reports ability to stand for 20 min with minimal to no increase in pain for household chores and activities such as cooking and laundry Baseline:  Goal status: INITIAL  2.  Patient demonstrates proper bending techniques with UE assist as needed Baseline:  Goal status: INITIAL  3.  Patient demonstrates and/or verbalizes proper lifting techniques for > 20 pounds  Baseline:  Goal status: INITIAL  4.  Patient will ambulate for 10 in with minimal to no increase in pain for community distances such as grocery shopping  Baseline:  Goal status: INITIAL  5.  Increase PSFS: THE PATIENT SPECIFIC FUNCTIONAL SCALE score by 2 points   Place score of 0-10 (0 = unable to perform activity and 10 = able to perform activity at the same level as before injury or problem)  Activity Date: 04/27/24    Standing > 20 min 6    2. Bending  3    3. Lifting > 20 pounds  5    4. Walking > 10  min  5    Total Score 18      Total Score = Sum of activity scores/number of activities   18/4 = 4.5 Baseline: 4.5  Goal status: INITIAL  6.  Independent in advanced HEP  Baseline:  Goal status: INITIAL  PLAN:  PT FREQUENCY: 1-2x/week  PT DURATION: 8 weeks  PLANNED INTERVENTIONS: 97164- PT Re-evaluation, 97110-Therapeutic exercises, 97530- Therapeutic activity, [id number]- Neuromuscular re-education, 97535- Self Care, [id number]- Manual therapy, [phone]- Gait training, [phone]- Aquatic Therapy, Patient/Family education, Balance training, Stair training, Taping  PLAN FOR NEXT SESSION: Check STGs next visit. Review and progress exercises; hip and glute strengthening, posterior pelvic tilts/lumbar flexion AROM, quad strengthening, lifting/carrying mechanics.   Lamarr GORMAN Price, PTA 05/19/2024, 11:50 AM

## 2024-05-20 ENCOUNTER — Other Ambulatory Visit: Payer: Self-pay

## 2024-05-20 ENCOUNTER — Encounter (HOSPITAL_COMMUNITY): Payer: Self-pay | Admitting: Obstetrics & Gynecology

## 2024-05-20 ENCOUNTER — Inpatient Hospital Stay (HOSPITAL_COMMUNITY)
Admission: AD | Admit: 2024-05-20 | Discharge: 2024-05-20 | Disposition: A | Discharge status: Home / Self Care | Payer: Self-pay | Attending: Obstetrics & Gynecology | Admitting: Obstetrics & Gynecology

## 2024-05-20 DIAGNOSIS — R102 Pelvic and perineal pain: Secondary | ICD-10-CM | POA: Diagnosis present

## 2024-05-20 DIAGNOSIS — O09299 Supervision of pregnancy with other poor reproductive or obstetric history, unspecified trimester: Secondary | ICD-10-CM

## 2024-05-20 DIAGNOSIS — O9A213 Injury, poisoning and certain other consequences of external causes complicating pregnancy, third trimester: Secondary | ICD-10-CM | POA: Insufficient documentation

## 2024-05-20 DIAGNOSIS — M545 Low back pain, unspecified: Secondary | ICD-10-CM | POA: Diagnosis not present

## 2024-05-20 DIAGNOSIS — W19XXXA Unspecified fall, initial encounter: Secondary | ICD-10-CM | POA: Insufficient documentation

## 2024-05-20 DIAGNOSIS — Z3480 Encounter for supervision of other normal pregnancy, unspecified trimester: Secondary | ICD-10-CM

## 2024-05-20 DIAGNOSIS — M5459 Other low back pain: Secondary | ICD-10-CM | POA: Insufficient documentation

## 2024-05-20 DIAGNOSIS — Z9181 History of falling: Secondary | ICD-10-CM | POA: Diagnosis present

## 2024-05-20 DIAGNOSIS — Z3A32 32 weeks gestation of pregnancy: Secondary | ICD-10-CM | POA: Diagnosis not present

## 2024-05-20 LAB — URINALYSIS, ROUTINE W REFLEX MICROSCOPIC
Bilirubin Urine: NEGATIVE
Glucose, UA: NEGATIVE mg/dL
Hgb urine dipstick: NEGATIVE
Ketones, ur: NEGATIVE mg/dL
Leukocytes,Ua: NEGATIVE
Nitrite: NEGATIVE
Protein, ur: NEGATIVE mg/dL
Specific Gravity, Urine: 1.023 (ref 1.005–1.030)
pH: 6 (ref 5.0–8.0)

## 2024-05-20 NOTE — Discharge Instructions (Signed)
 Kimberly Lewis,  You came to the MAU (Maternity Assessment Unit) today for new low back pain. We ruled out labor with a cervical check; you also had a normal urinalysis. Your baby looked good on the monitor while you were here. I think your back pain and pelvic pain is related to where baby is now sitting in your pelvis given you're entering the end of your third trimester. Some of this pain can also be from round ligament pain as your abdomen continues to stretch to accommodate baby.  - You can also use tylenol  as needed, as well as heat/ice to help with pain - Continue to use your pregnancy belt - Let your physical therapist know you are having round ligament pain, as there are exercises that can help with this  Reasons to return to the MAU: - You start to experience abdominal pain/contractions (squeezing sensation) - You experience vaginal bleeding or leakage of fluids - You do not feel your baby moving as much or at all - You develop a fever (> 100.73F or 38C)  Thank you for allowing me to be a part of your care! Alan Flies, MD [facility], PGY1 [facility] at [facility] [address] [place], [place] [address]

## 2024-05-20 NOTE — MAU Provider Note (Addendum)
 Chief Complaint:  Back Pain and Pelvic Pain   HPI    Kimberly Lewis is a 28 y.o. G3P2002 at [redacted]w[redacted]d who presents to maternity admissions reporting back pain. She fell on Friday 8/29 and had pelvic pain following (seen at MAU on that day). No abdominal trauma at time of fall. Negative left foot and ankle xrays. EFM without concerning findings. Had pelvic pain in groin crease before fall, worsened after fall, now around to her back.  Was seen yesterday 9/3 at physical therapy and has been seeing them for a while for a different kind of back pain (at the site of her prior epidurals); has been going on for years. Plan to continue PT 1-2x/week.   Now presenting to MAU with back pain; she reports this is lower down than her prior back pain. Symptoms have been present for 1 day and include aching, occasional sharp feeling. Initial inciting event: none. Symptoms are always there, hasn't gotten better or worse. Nothing seems to make it better except moving around some. Nothing has made it worse. Treatments so far initiated by patient: moving around some. Has not taken tylenol , not used heat or ice. Has used pregnancy belt.  Red Flags Fecal/urinary incontinence: no  Numbness/Weakness: no  Fever/chills/sweats: no  Night pain: no  Unexplained weight loss: no  No relief with bedrest: no  h/o cancer/immunosuppression: no  IV drug use: no  PMH of osteoporosis or chronic steroid use: no    Contractions: occasional Braxon-Hicks contractions (tightness in her abdomen and pressure), happening multiple times a day  Vaginal bleeding: none  Fluid leakage: none  Fetal movement: yes.  Partner present to provide support.  Pregnancy Course: Receives care at [facility] . Prenatal records reviewed. Elevated BMI (~40 pregravid). Hx of shoulder dystocia in last pregnancy. Hx of postpartum hypertension.  Past Medical History:  Diagnosis Date   Depression    Fibroids     Inappropriate sinus tachycardia (HCC)    PCOS (polycystic ovarian syndrome)    Posttraumatic stress disorder 12/14/2015   Trichomonas infection 2017   UTI (urinary tract infection)    OB History  Gravida Para Term Preterm AB Living  3 2 2  0 0 2  SAB IAB Ectopic Multiple Live Births  0 0 0 0 2    # Outcome Date GA Lbr Len/2nd Weight Sex Type Anes PTL Lv  3 Current           2 Term 08/26/22 [redacted]w[redacted]d 10:12 / 02:49 4000 g M Vag-Spont EPI  LIV  1 Term 03/26/12   3204 g M Vag-Spont   LIV    Obstetric Comments  2023 PP HTN   Past Surgical History:  Procedure Laterality Date   CHOLECYSTECTOMY     WISDOM TOOTH EXTRACTION Bilateral    Family History  Problem Relation Age of Onset   Healthy Mother    Other Father        unknown   Hypertension Maternal Grandmother    Cancer Maternal Grandfather    Cancer Paternal Grandmother    Diabetes Other    Asthma Neg Hx    Heart disease Neg Hx    Stroke Neg Hx    Social History   Tobacco Use   Smoking status: Former    Types: Cigarettes    Passive exposure: Past  Vaping Use   Vaping status: Never Used  Substance Use Topics   Alcohol use: Not Currently   Drug use: Not Currently  Allergies  Allergen Reactions   Aspirin  Nausea And Vomiting    abd pain and vomiting abd pain and vomiting    Amoxicillin Itching and Swelling    Per patient, caused vaginal itching and swelling   Medications Prior to Admission  Medication Sig Dispense Refill Last Dose/Taking   famotidine  (PEPCID ) 20 MG tablet Take 1 tablet (20 mg total) by mouth 2 (two) times daily. 60 tablet 2 Past Month   Prenatal Vit-Fe Fumarate-FA (PREPLUS) 27-1 MG TABS Take 1 tablet by mouth daily. 30 tablet 13 Past Week    I have reviewed patient's Past Medical Hx, Surgical Hx, Family Hx, Social Hx, medications and allergies.   ROS  Pertinent items noted in HPI and remainder of comprehensive ROS otherwise negative.   PHYSICAL EXAM  Patient Vitals for the past 24 hrs:  BP  Temp Temp src Pulse Resp SpO2 Height Weight  05/20/24 1900 119/65 -- -- 71 -- 100 % -- --  05/20/24 1855 -- -- -- -- -- 100 % -- --  05/20/24 1846 114/68 -- -- 71 -- -- -- --  05/20/24 1830 -- -- -- -- -- 100 % -- --  05/20/24 1815 124/65 -- -- 71 -- -- -- --  05/20/24 1802 (!) 112/57 -- -- 76 -- 100 % -- --  05/20/24 1759 (!) 115/58 -- -- 72 -- -- -- --  05/20/24 1737 (!) 121/59 98.4 F (36.9 C) Oral 66 18 100 % -- --  05/20/24 1731 -- -- -- -- -- -- 5' 1 (1.549 m) 103.2 kg    Constitutional: Well-developed, well-nourished female in no acute distress.  HEENT: atraumatic, normocephalic. Neck has normal ROM. EOM intact. Cardiovascular: normal rate & rhythm, warm and well-perfused Respiratory: normal effort, no problems with respiration noted GI: Abd soft, non-tender, gravid MSK: Extremities nontender, no edema, normal ROM Skin: warm and dry. Acyanotic, no jaundice or pallor. Neurologic: Alert and oriented x 4. No abnormal coordination. Psychiatric: Normal mood. Speech not slurred, not rapid/pressured. Patient is cooperative. Back: No spinal point tenderness. No flank tenderness. No CVA tenderness Cervical Exam: Dilation: Fingertip Effacement (%): Thick Station: -3 Exam by:: Joesph Sear PA-C chaperoned by Jeoffrey Ade RN.   Fetal Tracing: Baseline FHR: 135 per minute Fetal heart variability: moderate Fetal Heart Rate accelerations: yes Fetal Heart Rate decelerations: none Fetal Non-stress Test: Category I (reactive) Toco: No contractions  Labs: Results for orders placed or performed during the hospital encounter of 05/20/24 (from the past 24 hours)  Urinalysis, Routine w reflex microscopic -Urine, Clean Catch     Status: None   Collection Time: 05/20/24  6:26 PM  Result Value Ref Range   Color, Urine YELLOW YELLOW   APPearance CLEAR CLEAR   Specific Gravity, Urine 1.023 1.005 - 1.030   pH 6.0 5.0 - 8.0   Glucose, UA NEGATIVE NEGATIVE mg/dL   Hgb urine dipstick  NEGATIVE NEGATIVE   Bilirubin Urine NEGATIVE NEGATIVE   Ketones, ur NEGATIVE NEGATIVE mg/dL   Protein, ur NEGATIVE NEGATIVE mg/dL   Nitrite NEGATIVE NEGATIVE   Leukocytes,Ua NEGATIVE NEGATIVE    Imaging:  No results found.   MDM & MAU COURSE  MDM: Low  MAU Course: Differential diagnosis considered for back pain includes but is not limited to: nephrolithiasis, constipation, acute back strain, postpartum back pain, degenerative spine disease, disc herniation, epidural abscess, cauda equina, AAA, spine fracture, labor, round ligament pain, positioning of baby.  Labor ruled out with cervical check. Urinalysis with normal findings.  Suspect symptoms related  to round ligament pain and positioning of baby in third trimester. Patient reassured. Recommended to use tylenol , heat/ice, pregnancy belt, physical therapy to help with symptoms.  Orders Placed This Encounter  Procedures   Urinalysis, Routine w reflex microscopic -Urine, Clean Catch   Discharge patient   No orders of the defined types were placed in this encounter.   ASSESSMENT   1. Midline low back pain without sciatica, unspecified chronicity   2. Supervision of other normal pregnancy, antepartum   3. History of shoulder dystocia in prior pregnancy, currently pregnant   4. [redacted] weeks gestation of pregnancy     PLAN  Discharge home in stable condition with return precautions.      Allergies as of 05/20/2024       Reactions   Aspirin  Nausea And Vomiting   abd pain and vomiting abd pain and vomiting   Amoxicillin Itching, Swelling   Per patient, caused vaginal itching and swelling        Medication List     TAKE these medications    famotidine  20 MG tablet Commonly known as: Pepcid  Take 1 tablet (20 mg total) by mouth 2 (two) times daily.   PrePLUS 27-1 MG Tabs Take 1 tablet by mouth daily.        Alan Flies, MD

## 2024-05-20 NOTE — MAU Note (Signed)
 Kimberly Lewis is a 27 y.o. at [redacted]w[redacted]d here in MAU reporting: she fell on Friday has had continuing pelvic pain since fall but today has begun to have lower back pain.  Denies taking meds to treat pain.  Denies VB or LOF.  Endorses +FM.  LMP: 10/06/2023 Onset of complaint: Friday Pain score: 7  Vitals:   05/20/24 1737  BP: (!) 121/59  Pulse: 66  Resp: 18  Temp: 98.4 F (36.9 C)  SpO2: 100%     FHT: 138 bpm  Lab orders placed from triage: UA

## 2024-05-21 ENCOUNTER — Ambulatory Visit (INDEPENDENT_AMBULATORY_CARE_PROVIDER_SITE_OTHER): Admitting: Certified Nurse Midwife

## 2024-05-21 VITALS — BP 101/63 | HR 70 | Wt 228.0 lb

## 2024-05-21 DIAGNOSIS — O09299 Supervision of pregnancy with other poor reproductive or obstetric history, unspecified trimester: Secondary | ICD-10-CM

## 2024-05-21 DIAGNOSIS — O09293 Supervision of pregnancy with other poor reproductive or obstetric history, third trimester: Secondary | ICD-10-CM | POA: Diagnosis not present

## 2024-05-21 DIAGNOSIS — Z3A32 32 weeks gestation of pregnancy: Secondary | ICD-10-CM | POA: Diagnosis not present

## 2024-05-21 DIAGNOSIS — Z3480 Encounter for supervision of other normal pregnancy, unspecified trimester: Secondary | ICD-10-CM

## 2024-05-21 NOTE — Patient Instructions (Signed)
 Things to Try After 37 weeks to Encourage Labor/Get Ready for Labor:    Try the [employer] at [email or web address] daily to improve baby's position and encourage the onset of labor.  Walk a little and rest a little every day.  Change positions often.  Cervical Ripening: May try one or both Red Raspberry Leaf capsules or tea:  two 300mg  or 400mg  tablets with each meal, 2-3 times a day, or 1-3 cups of tea daily  Potential Side Effects Of Raspberry Leaf:  Most women do not experience any side effects from drinking raspberry leaf tea. However, nausea and loose stools are possible.  Evening Primrose Oil capsules: take 1 capsule by mouth and place one capsule in the vagina every night.    Some of the potential side effects:  Upset stomach  Loose stools or diarrhea  Headaches  Nausea  Sex can also help the cervix ripen and encourage labor onset.  5. Eating 6-8 dates per day can help soften and even dilate your cervix. You may eat them plain, in smoothies, or in energy balls.  They do contain sugar so eat with caution and balance with protein.

## 2024-05-24 ENCOUNTER — Ambulatory Visit

## 2024-05-24 DIAGNOSIS — M5459 Other low back pain: Secondary | ICD-10-CM

## 2024-05-24 DIAGNOSIS — R29898 Other symptoms and signs involving the musculoskeletal system: Secondary | ICD-10-CM

## 2024-05-24 NOTE — Therapy (Signed)
 OUTPATIENT PHYSICAL THERAPY THORACOLUMBAR TREATMENT   Patient Name: Kimberly Lewis MRN: [id number] DOB:29-Mar-1996, 28 y.o., female Today's Date: 05/24/2024  END OF SESSION:  PT End of Session - 05/24/24 1103     Visit Number 6    Number of Visits 16    Date for PT Re-Evaluation 06/22/24    Authorization Type [employer] auth required copay $4    PT Start Time 1104    PT Stop Time 1147    PT Time Calculation (min) 43 min    Activity Tolerance Patient tolerated treatment well    Behavior During Therapy [facility] for tasks assessed/performed         Past Medical History:  Diagnosis Date   Depression    Fibroids    Inappropriate sinus tachycardia (HCC)    PCOS (polycystic ovarian syndrome)    Posttraumatic stress disorder 12/14/2015   Trichomonas infection 2017   UTI (urinary tract infection)    Past Surgical History:  Procedure Laterality Date   CHOLECYSTECTOMY     WISDOM TOOTH EXTRACTION Bilateral    Patient Active Problem List   Diagnosis Date Noted   History of shoulder dystocia in prior pregnancy, currently pregnant 03/22/2024   Obesity affecting pregnancy, antepartum 02/16/2024   BMI 39.0-39.9,adult 01/05/2024   Supervision of normal pregnancy 12/09/2023   History of postpartum hypertension 08/27/2022   Inappropriate sinus tachycardia (HCC) 02/04/2022   Papanicolaou smear of cervix with low grade squamous intraepithelial lesion (LGSIL) 07/05/2020    PCP: No PCP  REFERRING PROVIDER: Dr Burnard VEAR Pate  REFERRING DIAG: Back pain in pregnancy - third trimester   Rationale for Evaluation and Treatment: Rehabilitation  THERAPY DIAG:  Other low back pain  Other symptoms and signs involving the musculoskeletal system  ONSET DATE: 12/30/23  SUBJECTIVE:                                                                                                                                                                                           SUBJECTIVE STATEMENT: Patient  reports her pubis pain went away, thinks it was related to her fall last week. Patient states her low back pain has gotten better but it continues to fluctuate. Patient states her foot/ankle continues to bother her.  Eval: Patient reports that she has had back pain in the past 3- 4 months. She has pain in the spot where she had epidurals in the past two pregnancies 08/26/22 and 03/26/2012. She feels that she had damage to LB when they did the past epidural. She notices gradual onset of LBP in the past 4 months with pain increasing as the pregnancy has progressed. Now  [redacted] weeks pregnant   PERTINENT HISTORY:  LBP since the first pregnancy; gall bladder surgery 2024; history of sinus tachycardia with no medication; bilat knee pain     PAIN:  Are you having pain? Yes: NPRS scale: 3/10 Pain location: bilat LBP Pain description: constant; sometimes aching; sometimes intense  Aggravating factors: any movement and at rest - standing and bending  Relieving factors: pregnancy belt that supports belly  PRECAUTIONS: Other: third trimester pregnancy  WEIGHT BEARING RESTRICTIONS: No  FALLS:  Has patient fallen in last 6 months? Yes. Number of falls 1 no injury dropped to knees   LIVING ENVIRONMENT: Lives with: lives with their family Lives in: House/apartment Stairs: Yes: External: 4 steps; on left going up Has following equipment at home: None  OCCUPATION: not working - worked until 5 months pregnancy on feet for ~ 10 hour days    PATIENT GOALS: help get rid of pain   NEXT MD VISIT: OB 05/21/24  OBJECTIVE:  Note: Objective measures were completed at Evaluation unless otherwise noted.  DIAGNOSTIC FINDINGS:  None for lumbar spine   PATIENT SURVEYS:  PSFS: THE PATIENT SPECIFIC FUNCTIONAL SCALE  Place score of 0-10 (0 = unable to perform activity and 10 = able to perform activity at the same level as before injury or problem)  Activity Date: 04/27/24    Standing > 20 min 6    2. Bending  3     3. Lifting > 20 pounds  5    4. Walking > 10 min  5    Total Score 18      Total Score = Sum of activity scores/number of activities  18/4 = 4.5  Minimally Detectable Change: 3 points (for single activity); 2 points (for average score)  [facility] (nd). The Patient Specific Functional Scale . Retrieved from [email or web address]     SENSATION: WFL  POSTURE: rounded shoulders, forward head, increased lumbar lordosis, increased thoracic kyphosis, and flexed trunk - stands with bilat knees hyperextended   PALPATION: Tightness bilat lumbar musculature   LUMBAR ROM:   AROM eval  Flexion 70% pull; pain return to stand    Extension 25% pain   Right lateral flexion 90%   Left lateral flexion 90% pull R; round ligament   Right rotation   Left rotation    (Blank rows = not tested)  LOWER EXTREMITY ROM:  WFL's   Active  Right eval Left eval  Hip flexion    Hip extension    Hip abduction    Hip adduction    Hip internal rotation    Hip external rotation    Knee flexion    Knee extension    Ankle dorsiflexion    Ankle plantarflexion    Ankle inversion    Ankle eversion     (Blank rows = not tested)  LOWER EXTREMITY MMT:  functional weakness in knee extension - standing with knees hyperextended   MMT Right eval Left eval  Hip flexion    Hip extension    Hip abduction    Hip adduction    Hip internal rotation    Hip external rotation    Knee flexion    Knee extension    Ankle dorsiflexion    Ankle plantarflexion    Ankle inversion    Ankle eversion     (Blank rows = not tested)  GAIT: Distance walked: 40 feet Assistive device utilized: None Level of assistance: Complete Independence Comments: wide based gait; knees hyperextended  [facility] Adult PT Treatment:                                                DATE: 05/24/2024 Neuromuscular re-ed: Side lying on green bolster & pillow: Reverse  clamshells + yellow TB around ankles (pillow b/w knees) Bent knee hip abd in extension  Sit to stand with self-correction cues with knee hyperextension Therapeutic Activity: Seated on red PB: Pelvic tilts & circles Side bend stretch Hammer curls + green TB Side arm raises + red TB Standing with back abainst soft foam roller --> T arm pec stretch  Self Care: Instruction on kinesiotape for abdomen support/pressure relief Gentle rock tape for belly support (U line) Ice ankle for pain management    [facility] Adult PT Treatment:                                                DATE: 05/19/2024 Neuromuscular re-ed: Seated on red PB: Shoulder extension press down + red TB 2x10 Rows + green TB 2x10 Paloff press + green TB x12 Seated:  SLR 2x10x5 --> knee slightly bent Hip abd isometric press with gait belt 2x10x5 Bilateral hip abd with sliders + green TB around thighs & yellow TB around ankles PF activation (elevator/pencil lift) Therapeutic Activity: Seated on red PB: Pelvic tilts Pelvic rocking Pelvic circles Hip hinge mini squat --> added PF acitvation   [facility] Adult PT Treatment:                                                DATE: 05/10/2024 Neuromuscular re-ed: Quadruped with feet on wall --> HS activation + breathing & TA activation S/L with back against wall --> clamshells + green TB 2x10 S/L with back leg supported in foam roller: Bent knee hip abd 2x10 Reverse clamshells  Seated SLR 2x10x5 --> knee slightly bent Therapeutic Activity: Seated on red PB --> posterior pelvic tilts Self Care: Modifying to carrying toddler on back instead of on side hip Recommendations for back carriers   PATIENT EDUCATION:  Education details: Updated HEP  Person educated: Patient Education method: Explanation, Demonstration, Tactile cues, Verbal cues, and Handouts Education comprehension: verbalized understanding, returned demonstration, verbal cues required, tactile cues required, and  needs further education  HOME EXERCISE PROGRAM: Access Code: [email or web address] URL: [email or web address] Date: 05/03/2024 Prepared by: Lamarr Price  Exercises - Seated Flexion Stretch with Swiss Ball  - 2 x daily - 7 x weekly - 1 sets - 3 reps - 30 sec  hold - Cat Cow  - 2 x daily - 7 x weekly - 1 sets - 5-10 reps - 3-5 sec  hold - Seated Pelvic Floor Contraction  - 2 x daily - 7 x weekly - 1 sets - 10 reps - 10 sec  hold - Pelvic Tilt on Swiss Ball  - 2 x daily - 7 x weekly - 1 sets - Seated Cervical Sidebending AROM  - 2 x daily - 7 x weekly - 1 sets - 3-5 reps - 5-10 sec  hold - Seated Scapular Retraction  - 2 x daily -  7 x weekly - 1-2 sets - 10 reps - 10 sec  hold - Shoulder External Rotation and Scapular Retraction with Resistance  - 2 x daily - 7 x weekly - 1 sets - 10 reps - 3-5 sec  hold - Shoulder W - External Rotation with Resistance  - 2 x daily - 7 x weekly - 1-2 sets - 10 reps - 3 sec  hold - Standing Backward Shoulder Rolls  - 2 x daily - 7 x weekly - 1 sets - 10 reps - 1-2 sec  hold - Sidelying Reverse Clamshell  - 1 x daily - 7 x weekly - 1-3 sets - 10 reps - Sidelying Bent Knee Lift at 45 Degrees  - 1 x daily - 7 x weekly - 1-3 sets - 10 reps - Seated Active Straight-Leg Raise  - 1 x daily - 7 x weekly - 3 sets - 10 reps - 5 sec hold   ASSESSMENT:  CLINICAL IMPRESSION: Continued to defer hip ER due to exacerbation of pubis pain on Lt; patient tolerated support side lying hip IR and abduction in extension well with no exacerbation of pain or discomfort. Verbal cueing continued for awareness of bilateral knee hyperextension in standing; Cueing tactile assist for lower abdomen support improved LE alignment and decreased compensation with knee hyperextension. Trialed kinesiotape for belly support; will follow-up with response at next visit.   Eval: Patient is a 28 y.o. female who was seen today for physical therapy evaluation and treatment for back pain in third  trimester of pregnancy. She presents with limited trunk ROM/mobility; muscular tightness through lumbar spine; LBP which is constant in nature; limited functional activity level due to back pain. Patient stands with bilat knees in hyperextended. Patient will benefit from PT to address problems identified.    GOALS: Goals reviewed with patient? Yes  SHORT TERM GOALS: Target date: 05/25/2024  Independent in initial HEP  Baseline: Goal status: INITIAL  2.  Patient demonstrates appropriate standing posture without hyperextension of knees  Baseline:  05/24/24:  Goal status:  3.  Patient demonstrates ability to sit for 10 min with minimal to no increase in pain  Baseline:  05/24/24: 5-7/10 pain with sitting for 10 min Goal status: NOT MET  LONG TERM GOALS: Target date: 06/22/2024  Patient reports ability to stand for 20 min with minimal to no increase in pain for household chores and activities such as cooking and laundry Baseline:  Goal status: INITIAL  2.  Patient demonstrates proper bending techniques with UE assist as needed Baseline:  Goal status: INITIAL  3.  Patient demonstrates and/or verbalizes proper lifting techniques for > 20 pounds  Baseline:  Goal status: INITIAL  4.  Patient will ambulate for 10 in with minimal to no increase in pain for community distances such as grocery shopping  Baseline:  Goal status: INITIAL  5.  Increase PSFS: THE PATIENT SPECIFIC FUNCTIONAL SCALE score by 2 points   Place score of 0-10 (0 = unable to perform activity and 10 = able to perform activity at the same level as before injury or problem)  Activity Date: 04/27/24    Standing > 20 min 6    2. Bending  3    3. Lifting > 20 pounds  5    4. Walking > 10 min  5    Total Score 18      Total Score = Sum of activity scores/number of activities  18/4 = 4.5 Baseline: 4.5  Goal  status: INITIAL  6.  Independent in advanced HEP  Baseline:  Goal status: INITIAL  PLAN:  PT FREQUENCY:  1-2x/week  PT DURATION: 8 weeks  PLANNED INTERVENTIONS: 97164- PT Re-evaluation, 97110-Therapeutic exercises, 97530- Therapeutic activity, [id number]- Neuromuscular re-education, 97535- Self Care, [id number]- Manual therapy, [phone]- Gait training, [phone]- Aquatic Therapy, Patient/Family education, Balance training, Stair training, Taping  PLAN FOR NEXT SESSION: Review and progress exercises as tolerated; defer Lt hip ER d/t pubis pain. Supported side lying - hip and glute strengthening; seated on physioball: posterior pelvic tilts/lumbar flexion AROM, postural strengthening & stretches; quad strengthening, lifting/carrying mechanics. Response to rock tape?   Lamarr GORMAN Price, PTA 05/24/2024, 11:51 AM

## 2024-05-27 ENCOUNTER — Ambulatory Visit: Admitting: Rehabilitative and Restorative Service Providers"

## 2024-05-31 ENCOUNTER — Ambulatory Visit

## 2024-05-31 DIAGNOSIS — M5459 Other low back pain: Secondary | ICD-10-CM

## 2024-05-31 DIAGNOSIS — R29898 Other symptoms and signs involving the musculoskeletal system: Secondary | ICD-10-CM

## 2024-05-31 NOTE — Therapy (Addendum)
 OUTPATIENT PHYSICAL THERAPY THORACOLUMBAR TREATMENT PHYSICAL THERAPY DISCHARGE SUMMARY  Visits from Start of Care: 7  Current functional level related to goals / functional outcomes: See progress note for discharge status    Remaining deficits: Continued discomfort in the LB - improvement reported with exercises    Education / Equipment: HEP    Patient agrees to discharge. Patient goals were partially met. Patient is being discharged due to not returning since the last visit.  Celyn P. Ina PT, MPH 08/09/24 11:14 AM     Patient Name: Brandis Wixted MRN: [id number] DOB:Mar 22, 1996, 28 y.o., female Today's Date: 05/31/2024  END OF SESSION:  PT End of Session - 05/31/24 1057     Visit Number 7    Number of Visits 16    Date for PT Re-Evaluation 06/22/24    Authorization Type [employer] auth required copay $4    PT Start Time 1058    PT Stop Time 1146    PT Time Calculation (min) 48 min    Activity Tolerance Patient tolerated treatment well    Behavior During Therapy [facility] for tasks assessed/performed         Past Medical History:  Diagnosis Date   Depression    Fibroids    Inappropriate sinus tachycardia (HCC)    PCOS (polycystic ovarian syndrome)    Posttraumatic stress disorder 12/14/2015   Trichomonas infection 2017   UTI (urinary tract infection)    Past Surgical History:  Procedure Laterality Date   CHOLECYSTECTOMY     WISDOM TOOTH EXTRACTION Bilateral    Patient Active Problem List   Diagnosis Date Noted   History of shoulder dystocia in prior pregnancy, currently pregnant 03/22/2024   Obesity affecting pregnancy, antepartum 02/16/2024   BMI 39.0-39.9,adult 01/05/2024   Supervision of normal pregnancy 12/09/2023   History of postpartum hypertension 08/27/2022   Inappropriate sinus tachycardia (HCC) 02/04/2022   Papanicolaou smear of cervix with low grade squamous intraepithelial lesion (LGSIL) 07/05/2020    PCP: No PCP  REFERRING PROVIDER: Dr Burnard VEAR Pate  REFERRING DIAG: Back pain in pregnancy - third trimester   Rationale for Evaluation and Treatment: Rehabilitation  THERAPY DIAG:  Other symptoms and signs involving the musculoskeletal system  Other low back pain  ONSET DATE: 12/30/23  SUBJECTIVE:                                                                                                                                                                                           SUBJECTIVE STATEMENT: Patient reports her Lt pubic pain is high (7/10), states she got a physioball and that helps alleviate pain when she sits  on it. Patient states her ankle is feeling better.   Eval: Patient reports that she has had back pain in the past 3- 4 months. She has pain in the spot where she had epidurals in the past two pregnancies 08/26/22 and 03/26/2012. She feels that she had damage to LB when they did the past epidural. She notices gradual onset of LBP in the past 4 months with pain increasing as the pregnancy has progressed. Now [redacted] weeks pregnant   PERTINENT HISTORY:  LBP since the first pregnancy; gall bladder surgery 2024; history of sinus tachycardia with no medication; bilat knee pain     PAIN:  Are you having pain? Yes: NPRS scale: 7/10 pelvis pain, 3/10 low back pain Pain location: bilat LBP Pain description: constant; sometimes aching; sometimes intense  Aggravating factors: any movement and at rest - standing and bending  Relieving factors: pregnancy belt that supports belly  PRECAUTIONS: Other: third trimester pregnancy  WEIGHT BEARING RESTRICTIONS: No  FALLS:  Has patient fallen in last 6 months? Yes. Number of falls 1 no injury dropped to knees   LIVING ENVIRONMENT: Lives with: lives with their family Lives in: House/apartment Stairs: Yes: External: 4 steps; on left going up Has following equipment at home: None  OCCUPATION: not working - worked until 5 months pregnancy on feet for ~ 10 hour days    PATIENT  GOALS: help get rid of pain   NEXT MD VISIT: OB 05/21/24  OBJECTIVE:  Note: Objective measures were completed at Evaluation unless otherwise noted.  DIAGNOSTIC FINDINGS:  None for lumbar spine   PATIENT SURVEYS:  PSFS: THE PATIENT SPECIFIC FUNCTIONAL SCALE  Place score of 0-10 (0 = unable to perform activity and 10 = able to perform activity at the same level as before injury or problem)  Activity Date: 04/27/24    Standing > 20 min 6    2. Bending  3    3. Lifting > 20 pounds  5    4. Walking > 10 min  5    Total Score 18      Total Score = Sum of activity scores/number of activities  18/4 = 4.5  Minimally Detectable Change: 3 points (for single activity); 2 points (for average score)  [facility] (nd). The Patient Specific Functional Scale . Retrieved from [email or web address]     SENSATION: WFL  POSTURE: rounded shoulders, forward head, increased lumbar lordosis, increased thoracic kyphosis, and flexed trunk - stands with bilat knees hyperextended   PALPATION: Tightness bilat lumbar musculature   LUMBAR ROM:   AROM eval  Flexion 70% pull; pain return to stand    Extension 25% pain   Right lateral flexion 90%   Left lateral flexion 90% pull R; round ligament   Right rotation   Left rotation    (Blank rows = not tested)  LOWER EXTREMITY ROM:  WFL's   Active  Right eval Left eval  Hip flexion    Hip extension    Hip abduction    Hip adduction    Hip internal rotation    Hip external rotation    Knee flexion    Knee extension    Ankle dorsiflexion    Ankle plantarflexion    Ankle inversion    Ankle eversion     (Blank rows = not tested)  LOWER EXTREMITY MMT:  functional weakness in knee extension - standing with knees hyperextended   MMT Right eval Left eval  Hip flexion  Hip extension    Hip abduction    Hip adduction    Hip internal rotation    Hip external rotation    Knee  flexion    Knee extension    Ankle dorsiflexion    Ankle plantarflexion    Ankle inversion    Ankle eversion     (Blank rows = not tested)  GAIT: Distance walked: 40 feet Assistive device utilized: None Level of assistance: Complete Independence Comments: wide based gait; knees hyperextended    [facility] Adult PT Treatment:                                                DATE: 05/31/2024 Neuromuscular re-ed: Side Lying (Rt) with towel roll underneath Rt waist --> foam roller b/w legs in 90/90 position: Posterior lateral unilateral pelvic shift (top leg) Isometric positional clamshell (90/90 lift & hold) + breathing cues Quadruped breathing + HS activation --> isometric activation with feet on wall Therapeutic Activity: Sitting on red physioball: Pelvic tilts & circles Shoulder girdle circles with hands on shoulders (elbow circles) Ribcage lift breathing with crossed arms overhead Side bend stretch with reach overhead Rolling foam roller up wall + ribcage elevation & breathing Standing with back against foam roller + folded towel at lumbar: W arm open/close (pec lengthening) Shoulder horizontal abduction + red TB Self Care: Provided patient with instruction on kinesiotape application for abdomen support/pressure relief Gentle rock tape for belly support (U line)    [facility] Adult PT Treatment:                                                DATE: 05/24/2024 Neuromuscular re-ed: Side lying on green bolster & pillow: Reverse clamshells + yellow TB around ankles (pillow b/w knees) Bent knee hip abd in extension  Sit to stand with self-correction cues with knee hyperextension Therapeutic Activity: Seated on red PB: Pelvic tilts & circles Side bend stretch Hammer curls + green TB Side arm raises + red TB Standing with back abainst soft foam roller --> T arm pec stretch  Self Care: Instruction on kinesiotape for abdomen support/pressure relief Gentle rock tape for belly support (U  line) Ice ankle for pain management    [facility] Adult PT Treatment:                                                DATE: 05/19/2024 Neuromuscular re-ed: Seated on red PB: Shoulder extension press down + red TB 2x10 Rows + green TB 2x10 Paloff press + green TB x12 Seated:  SLR 2x10x5 --> knee slightly bent Hip abd isometric press with gait belt 2x10x5 Bilateral hip abd with sliders + green TB around thighs & yellow TB around ankles PF activation (elevator/pencil lift) Therapeutic Activity: Seated on red PB: Pelvic tilts Pelvic rocking Pelvic circles Hip hinge mini squat --> added PF acitvation   [facility] Adult PT Treatment:  DATE: 05/10/2024 Neuromuscular re-ed: Quadruped with feet on wall --> HS activation + breathing & TA activation S/L with back against wall --> clamshells + green TB 2x10 S/L with back leg supported in foam roller: Bent knee hip abd 2x10 Reverse clamshells  Seated SLR 2x10x5 --> knee slightly bent Therapeutic Activity: Seated on red PB --> posterior pelvic tilts Self Care: Modifying to carrying toddler on back instead of on side hip Recommendations for back carriers   PATIENT EDUCATION:  Education details: Instruction on kinesiotape application for pressure relief/abdomen support Person educated: Patient Education method: Explanation, Demonstration, Tactile cues, Verbal cues, and Handouts Education comprehension: verbalized understanding, returned demonstration, verbal cues required, tactile cues required, and needs further education  HOME EXERCISE PROGRAM: Access Code: [email or web address] URL: [email or web address] Date: 05/03/2024 Prepared by: Lamarr Price  Exercises - Seated Flexion Stretch with Swiss Ball  - 2 x daily - 7 x weekly - 1 sets - 3 reps - 30 sec  hold - Cat Cow  - 2 x daily - 7 x weekly - 1 sets - 5-10 reps - 3-5 sec  hold - Seated Pelvic Floor Contraction  - 2 x daily - 7 x weekly - 1  sets - 10 reps - 10 sec  hold - Pelvic Tilt on Swiss Ball  - 2 x daily - 7 x weekly - 1 sets - Seated Cervical Sidebending AROM  - 2 x daily - 7 x weekly - 1 sets - 3-5 reps - 5-10 sec  hold - Seated Scapular Retraction  - 2 x daily - 7 x weekly - 1-2 sets - 10 reps - 10 sec  hold - Shoulder External Rotation and Scapular Retraction with Resistance  - 2 x daily - 7 x weekly - 1 sets - 10 reps - 3-5 sec  hold - Shoulder W - External Rotation with Resistance  - 2 x daily - 7 x weekly - 1-2 sets - 10 reps - 3 sec  hold - Standing Backward Shoulder Rolls  - 2 x daily - 7 x weekly - 1 sets - 10 reps - 1-2 sec  hold - Sidelying Reverse Clamshell  - 1 x daily - 7 x weekly - 1-3 sets - 10 reps - Sidelying Bent Knee Lift at 45 Degrees  - 1 x daily - 7 x weekly - 1-3 sets - 10 reps - Seated Active Straight-Leg Raise  - 1 x daily - 7 x weekly - 3 sets - 10 reps - 5 sec hold   ASSESSMENT:  CLINICAL IMPRESSION:  Good response with side lying hip shifts with no pelvic pain on Lt side. Breathing exercises completed in various positions with focus on ribcage elevation and improving diaphragm function for improved diaphragmatic breathing. Patient instruction provided for kinesiotape self-application for abdomen support and pressure relief.  Eval: Patient is a 28 y.o. female who was seen today for physical therapy evaluation and treatment for back pain in third trimester of pregnancy. She presents with limited trunk ROM/mobility; muscular tightness through lumbar spine; LBP which is constant in nature; limited functional activity level due to back pain. Patient stands with bilat knees in hyperextended. Patient will benefit from PT to address problems identified.    GOALS: Goals reviewed with patient? Yes  SHORT TERM GOALS: Target date: 05/25/2024  Independent in initial HEP  Baseline: Goal status: INITIAL  2.  Patient demonstrates appropriate standing posture without hyperextension of knees  Baseline:   05/24/24:  Goal status:  3.  Patient demonstrates  ability to sit for 10 min with minimal to no increase in pain  Baseline:  05/24/24: 5-7/10 pain with sitting for 10 min Goal status: NOT MET   LONG TERM GOALS: Target date: 06/22/2024  Patient reports ability to stand for 20 min with minimal to no increase in pain for household chores and activities such as cooking and laundry Baseline:  Goal status: INITIAL  2.  Patient demonstrates proper bending techniques with UE assist as needed Baseline:  Goal status: INITIAL  3.  Patient demonstrates and/or verbalizes proper lifting techniques for > 20 pounds  Baseline:  Goal status: INITIAL  4.  Patient will ambulate for 10 in with minimal to no increase in pain for community distances such as grocery shopping  Baseline:  Goal status: INITIAL  5.  Increase PSFS: THE PATIENT SPECIFIC FUNCTIONAL SCALE score by 2 points   Place score of 0-10 (0 = unable to perform activity and 10 = able to perform activity at the same level as before injury or problem)  Activity Date: 04/27/24    Standing > 20 min 6    2. Bending  3    3. Lifting > 20 pounds  5    4. Walking > 10 min  5    Total Score 18      Total Score = Sum of activity scores/number of activities  18/4 = 4.5 Baseline: 4.5  Goal status: INITIAL  6.  Independent in advanced HEP  Baseline:  Goal status: INITIAL  PLAN:  PT FREQUENCY: 1-2x/week  PT DURATION: 8 weeks  PLANNED INTERVENTIONS: 97164- PT Re-evaluation, 97110-Therapeutic exercises, 97530- Therapeutic activity, [id number]- Neuromuscular re-education, 97535- Self Care, [id number]- Manual therapy, [phone]- Gait training, [id number]- Aquatic Therapy, Patient/Family education, Balance training, Stair training, Taping  PLAN FOR NEXT SESSION: Glute strengthening/activation, pelvis mobility (posterior bias), postural strengthening as tolerated. Rock tape as needed.  Lamarr GORMAN Price, PTA 05/31/2024, 11:54 AM

## 2024-06-01 NOTE — Progress Notes (Unsigned)
   PRENATAL VISIT NOTE  Subjective:  Kimberly Lewis is a 28 y.o. G3P2002 at [redacted]w[redacted]d being seen today for ongoing prenatal care.  She is currently monitored for the following issues for this low-risk pregnancy and has Inappropriate sinus tachycardia (HCC); Papanicolaou smear of cervix with low grade squamous intraepithelial lesion (LGSIL); History of postpartum hypertension; Supervision of normal pregnancy; BMI 39.0-39.9,adult; Obesity affecting pregnancy, antepartum; and History of shoulder dystocia in prior pregnancy, currently pregnant on their problem list.  Patient reports {[id number]}.   .  .   . Denies leaking of fluid.   The following portions of the patient's history were reviewed and updated as appropriate: allergies, current medications, past family history, past medical history, past social history, past surgical history and problem list.   Objective:    There were no vitals filed for this visit.  Fetal Status:           General: Alert, oriented and cooperative. Patient is in no acute distress.  Skin: Skin is warm and dry. No rash noted.   Cardiovascular: Normal heart rate noted  Respiratory: Normal respiratory effort, no problems with respiration noted  Abdomen: Soft, gravid, appropriate for gestational age.        Pelvic: Cervical exam deferred        Extremities: Normal range of motion.     Mental Status: Normal mood and affect. Normal behavior. Normal judgment and thought content.   Assessment and Plan:  Pregnancy: G3P2002 at [redacted]w[redacted]d 1. Pregnancy with 34 completed weeks gestation  2. Supervision of other normal pregnancy, antepartum (Primary) Offered flu shot - pt *** GBS next visit  3. Obesity affecting pregnancy, antepartum, unspecified obesity type Continue serial growth - next due on 8/22.  Start NST today.   4. History of shoulder dystocia in prior pregnancy, currently pregnant 30 sec per delivery record. 8lb13oz.  Discussed risk of recurrence and associated  risks of shoulder dystocia.  Discussed her options including 1LTCS. This baby is anticipated to be much smaller than the one she had SD with.  She would like ***  5. History of postpartum hypertension Unable to tolerate ldASA. Plan is to monitor closely postpartum.   Preterm labor symptoms and general obstetric precautions including but not limited to vaginal bleeding, contractions, leaking of fluid and fetal movement were reviewed in detail with the patient. Please refer to After Visit Summary for other counseling recommendations.   No follow-ups on file.  Future Appointments  Date Time Provider Department Center  06/02/2024 10:10 AM Cleatus Moccasin, MD [facility]  06/03/2024 11:00 AM Tiffany Lamarr RAMAN, PTA [facility]  06/04/2024  9:30 AM [facility] NURSE [facility]  06/04/2024  9:45 AM [facility] NST [facility]  06/07/2024 11:15 AM [facility] PROVIDER 1 [facility]  06/07/2024 11:30 AM [facility] US5 [facility]  06/09/2024  9:30 AM Ina, Celyn P, PT [facility]  06/11/2024  9:00 AM [facility] NURSE [facility]  06/11/2024  9:30 AM Tiffany Lamarr RAMAN, PTA [facility]  06/15/2024 10:15 AM Ina Florene SQUIBB, PT [facility]  06/18/2024  9:50 AM Regino Camie LABOR, CNM [facility]  06/18/2024 10:15 AM Tiffany Lamarr RAMAN, PTA [facility]  06/22/2024 11:00 AM Ina Florene SQUIBB, PT [facility]  06/25/2024  9:30 AM Rasch, Delon FERNS, NP [facility]  06/25/2024 10:15 AM Tiffany Lamarr RAMAN, PTA [facility]  07/02/2024 11:00 AM [facility] PROVIDER 1 [facility]  07/02/2024 11:30 AM [facility] US2 [facility]    Moccasin Cleatus, MD

## 2024-06-02 ENCOUNTER — Encounter: Payer: Self-pay | Admitting: Obstetrics and Gynecology

## 2024-06-02 ENCOUNTER — Ambulatory Visit (INDEPENDENT_AMBULATORY_CARE_PROVIDER_SITE_OTHER): Admitting: Obstetrics and Gynecology

## 2024-06-02 VITALS — BP 108/71 | HR 83 | Wt 232.0 lb

## 2024-06-02 DIAGNOSIS — Z3A34 34 weeks gestation of pregnancy: Secondary | ICD-10-CM

## 2024-06-02 DIAGNOSIS — Z8759 Personal history of other complications of pregnancy, childbirth and the puerperium: Secondary | ICD-10-CM | POA: Diagnosis not present

## 2024-06-02 DIAGNOSIS — O99213 Obesity complicating pregnancy, third trimester: Secondary | ICD-10-CM

## 2024-06-02 DIAGNOSIS — O99210 Obesity complicating pregnancy, unspecified trimester: Secondary | ICD-10-CM

## 2024-06-02 DIAGNOSIS — Z8679 Personal history of other diseases of the circulatory system: Secondary | ICD-10-CM | POA: Diagnosis not present

## 2024-06-02 DIAGNOSIS — O09299 Supervision of pregnancy with other poor reproductive or obstetric history, unspecified trimester: Secondary | ICD-10-CM

## 2024-06-02 DIAGNOSIS — O09293 Supervision of pregnancy with other poor reproductive or obstetric history, third trimester: Secondary | ICD-10-CM | POA: Diagnosis not present

## 2024-06-02 DIAGNOSIS — Z3480 Encounter for supervision of other normal pregnancy, unspecified trimester: Secondary | ICD-10-CM

## 2024-06-03 ENCOUNTER — Ambulatory Visit

## 2024-06-04 ENCOUNTER — Ambulatory Visit: Attending: Obstetrics | Admitting: Obstetrics

## 2024-06-04 ENCOUNTER — Ambulatory Visit: Admitting: *Deleted

## 2024-06-04 VITALS — BP 114/51 | HR 75

## 2024-06-04 DIAGNOSIS — Z3A34 34 weeks gestation of pregnancy: Secondary | ICD-10-CM | POA: Diagnosis not present

## 2024-06-04 DIAGNOSIS — O99213 Obesity complicating pregnancy, third trimester: Secondary | ICD-10-CM | POA: Insufficient documentation

## 2024-06-04 DIAGNOSIS — O99210 Obesity complicating pregnancy, unspecified trimester: Secondary | ICD-10-CM

## 2024-06-04 NOTE — Procedures (Signed)
 Kimberly Lewis 1996-06-26 [redacted]w[redacted]d  Fetus A Non-Stress Test Interpretation for 06/04/24-NST only  Indication: obese  Fetal Heart Rate A Mode: External Baseline Rate (A): 140 bpm Variability: Moderate Accelerations: 15 x 15 Decelerations: None Multiple birth?: No  Uterine Activity Mode: Toco Contraction Frequency (min): noen Resting Tone Palpated: Relaxed  Interpretation (Fetal Testing) Nonstress Test Interpretation: Reactive Comments: Tracing reviewed by Dr. Ileana

## 2024-06-07 ENCOUNTER — Ambulatory Visit (HOSPITAL_BASED_OUTPATIENT_CLINIC_OR_DEPARTMENT_OTHER): Attending: Maternal & Fetal Medicine | Admitting: Obstetrics and Gynecology

## 2024-06-07 ENCOUNTER — Other Ambulatory Visit: Payer: Self-pay | Admitting: *Deleted

## 2024-06-07 ENCOUNTER — Ambulatory Visit (HOSPITAL_BASED_OUTPATIENT_CLINIC_OR_DEPARTMENT_OTHER)

## 2024-06-07 VITALS — BP 116/66 | HR 86

## 2024-06-07 DIAGNOSIS — O99210 Obesity complicating pregnancy, unspecified trimester: Secondary | ICD-10-CM

## 2024-06-07 DIAGNOSIS — E669 Obesity, unspecified: Secondary | ICD-10-CM | POA: Diagnosis not present

## 2024-06-07 DIAGNOSIS — Z3A35 35 weeks gestation of pregnancy: Secondary | ICD-10-CM | POA: Insufficient documentation

## 2024-06-07 DIAGNOSIS — O365930 Maternal care for other known or suspected poor fetal growth, third trimester, not applicable or unspecified: Secondary | ICD-10-CM

## 2024-06-07 DIAGNOSIS — Z362 Encounter for other antenatal screening follow-up: Secondary | ICD-10-CM | POA: Insufficient documentation

## 2024-06-07 DIAGNOSIS — O09293 Supervision of pregnancy with other poor reproductive or obstetric history, third trimester: Secondary | ICD-10-CM

## 2024-06-07 DIAGNOSIS — O99213 Obesity complicating pregnancy, third trimester: Secondary | ICD-10-CM | POA: Diagnosis present

## 2024-06-07 NOTE — Progress Notes (Signed)
 Maternal-Fetal Medicine Consultation  Name: Kimberly Lewis  MRN: [id number]  GA: [id number] [redacted]w[redacted]d   Pregravid BMI 40.  Ultrasound Fetal growth is appropriate for gestational age.  Estimated fetal weight is at the 10th percentile.  Amniotic fluid is normal good fetal activity seen.  Antenatal testing is reassuring.  BPP 8/8 I reassured the patient of the findings.  I discussed the importance of weekly antenatal testing because of grade 3 obesity that is associated with a small increased risk of stillbirth.  I recommend a fetal growth assessment in 3 weeks to rule out fetal growth restriction.  On today's ultrasound, the estimated fetal weight is at the 10th percentile, which is not consistent with growth restriction.  Recommendations - Weekly NST at your office. - An appointment was made for her to return in 3 weeks for fetal growth assessment and BPP.     Consultation including face-to-face (more than 50%) counseling 10 minutes.

## 2024-06-09 ENCOUNTER — Ambulatory Visit: Admitting: Rehabilitative and Restorative Service Providers"

## 2024-06-11 ENCOUNTER — Ambulatory Visit

## 2024-06-11 ENCOUNTER — Encounter

## 2024-06-11 VITALS — BP 101/66 | HR 73 | Wt 234.0 lb

## 2024-06-11 DIAGNOSIS — Z3A35 35 weeks gestation of pregnancy: Secondary | ICD-10-CM

## 2024-06-11 DIAGNOSIS — O99210 Obesity complicating pregnancy, unspecified trimester: Secondary | ICD-10-CM

## 2024-06-11 DIAGNOSIS — O09299 Supervision of pregnancy with other poor reproductive or obstetric history, unspecified trimester: Secondary | ICD-10-CM

## 2024-06-11 DIAGNOSIS — Z3483 Encounter for supervision of other normal pregnancy, third trimester: Secondary | ICD-10-CM

## 2024-06-11 DIAGNOSIS — Z8679 Personal history of other diseases of the circulatory system: Secondary | ICD-10-CM

## 2024-06-11 DIAGNOSIS — O99213 Obesity complicating pregnancy, third trimester: Secondary | ICD-10-CM

## 2024-06-11 NOTE — Progress Notes (Addendum)
 Pt her for NST. Pt is 35 weeks 4 days gestation. Pt reports good fetal movement with concerns. VS WNL. NST reviewed by Camie Butler Potters, CNM, NST reactive and reassuring for gestational age. Follow up as scheduled.  Silvano LELON Piano, RN

## 2024-06-15 ENCOUNTER — Encounter: Payer: Self-pay | Admitting: Rehabilitative and Restorative Service Providers"

## 2024-06-15 ENCOUNTER — Encounter: Admitting: Rehabilitative and Restorative Service Providers"

## 2024-06-18 ENCOUNTER — Encounter

## 2024-06-18 ENCOUNTER — Other Ambulatory Visit (HOSPITAL_COMMUNITY)
Admission: RE | Admit: 2024-06-18 | Discharge: 2024-06-18 | Disposition: A | Discharge status: Home / Self Care | Source: Ambulatory Visit | Attending: Certified Nurse Midwife | Admitting: Certified Nurse Midwife

## 2024-06-18 ENCOUNTER — Ambulatory Visit: Admitting: Certified Nurse Midwife

## 2024-06-18 VITALS — BP 108/73 | HR 83 | Wt 237.0 lb

## 2024-06-18 DIAGNOSIS — O09293 Supervision of pregnancy with other poor reproductive or obstetric history, third trimester: Secondary | ICD-10-CM

## 2024-06-18 DIAGNOSIS — Z3483 Encounter for supervision of other normal pregnancy, third trimester: Secondary | ICD-10-CM | POA: Insufficient documentation

## 2024-06-18 DIAGNOSIS — O99213 Obesity complicating pregnancy, third trimester: Secondary | ICD-10-CM | POA: Diagnosis not present

## 2024-06-18 DIAGNOSIS — Z8679 Personal history of other diseases of the circulatory system: Secondary | ICD-10-CM | POA: Diagnosis not present

## 2024-06-18 DIAGNOSIS — O09299 Supervision of pregnancy with other poor reproductive or obstetric history, unspecified trimester: Secondary | ICD-10-CM

## 2024-06-18 DIAGNOSIS — Z3A36 36 weeks gestation of pregnancy: Secondary | ICD-10-CM | POA: Diagnosis not present

## 2024-06-18 DIAGNOSIS — Z8759 Personal history of other complications of pregnancy, childbirth and the puerperium: Secondary | ICD-10-CM

## 2024-06-18 DIAGNOSIS — O99210 Obesity complicating pregnancy, unspecified trimester: Secondary | ICD-10-CM

## 2024-06-18 NOTE — Progress Notes (Signed)
   PRENATAL VISIT NOTE  Subjective:  Kimberly Lewis is a 28 y.o. G3P2002 at [redacted]w[redacted]d being seen today for ongoing prenatal care.  She is currently monitored for the following issues for this high-risk pregnancy and has Inappropriate sinus tachycardia; Papanicolaou smear of cervix with low grade squamous intraepithelial lesion (LGSIL); History of postpartum hypertension; Supervision of normal pregnancy; BMI 39.0-39.9,adult; Obesity affecting pregnancy, antepartum; and History of shoulder dystocia in prior pregnancy, currently pregnant on their problem list.  Patient reports backache.  Contractions: Not present. Vag. Bleeding: None.  Movement: Present. Denies leaking of fluid.   The following portions of the patient's history were reviewed and updated as appropriate: allergies, current medications, past family history, past medical history, past social history, past surgical history and problem list.   Objective:    Vitals:   06/18/24 0938  BP: 108/73  Pulse: 83  Weight: 237 lb (107.5 kg)    Fetal Status:  Fetal Heart Rate (bpm): 140 Fundal Height: 35 cm Movement: Present    General: Alert, oriented and cooperative. Patient is in no acute distress.  Skin: Skin is warm and dry. No rash noted.   Cardiovascular: Normal heart rate noted  Respiratory: Normal respiratory effort, no problems with respiration noted  Abdomen: Soft, gravid, appropriate for gestational age.  Pain/Pressure: Present (pressure and back pain)     Pelvic: Cervical exam deferred        Extremities: Normal range of motion.  Edema: None  Mental Status: Normal mood and affect. Normal behavior. Normal judgment and thought content.   Assessment and Plan:  Pregnancy: G3P2002 at [redacted]w[redacted]d 1. Encounter for supervision of other normal pregnancy in third trimester (Primary) - Doing well, feeling regular and vigorous fetal movement   2. [redacted] weeks gestation of pregnancy - Routine PNC, anticipatory guidance.  - Discussed stretches,  positioning and pain relief.   3. History of postpartum hypertension - Allergic to ASA.   4. History of shoulder dystocia in prior pregnancy, currently pregnant - This infant was last projected to be in 10th%ile for growth. Not growth restricted, MFM will continue to follow.   5. Obesity affecting pregnancy, antepartum, unspecified obesity type - Followed by MFM.   Preterm labor symptoms and general obstetric precautions including but not limited to vaginal bleeding, contractions, leaking of fluid and fetal movement were reviewed in detail with the patient. Please refer to After Visit Summary for other counseling recommendations.   Return in about 1 week (around 06/25/2024) for HROB.  Future Appointments  Date Time Provider Department Center  06/25/2024  9:30 AM Rasch, Delon FERNS, NP [facility]  06/29/2024 10:15 AM [facility] PROVIDER 1 [facility]  06/29/2024 10:30 AM [facility] US4 [facility]    Camie DELENA Rote, CNM

## 2024-06-20 ENCOUNTER — Ambulatory Visit: Payer: Self-pay | Admitting: Certified Nurse Midwife

## 2024-06-20 LAB — STREP GP B NAA: Strep Gp B NAA: NEGATIVE

## 2024-06-21 LAB — CERVICOVAGINAL ANCILLARY ONLY
Chlamydia: NEGATIVE
Comment: NEGATIVE
Comment: NORMAL
Neisseria Gonorrhea: NEGATIVE

## 2024-06-22 ENCOUNTER — Ambulatory Visit: Admitting: Rehabilitative and Restorative Service Providers"

## 2024-06-23 ENCOUNTER — Encounter (HOSPITAL_COMMUNITY): Payer: Self-pay | Admitting: Obstetrics & Gynecology

## 2024-06-23 ENCOUNTER — Inpatient Hospital Stay (HOSPITAL_COMMUNITY)
Admission: AD | Admit: 2024-06-23 | Discharge: 2024-06-23 | Disposition: A | Discharge status: Home / Self Care | Attending: Obstetrics & Gynecology | Admitting: Obstetrics & Gynecology

## 2024-06-23 ENCOUNTER — Other Ambulatory Visit: Payer: Self-pay

## 2024-06-23 DIAGNOSIS — O479 False labor, unspecified: Secondary | ICD-10-CM

## 2024-06-23 DIAGNOSIS — Z0371 Encounter for suspected problem with amniotic cavity and membrane ruled out: Secondary | ICD-10-CM | POA: Diagnosis present

## 2024-06-23 DIAGNOSIS — O471 False labor at or after 37 completed weeks of gestation: Secondary | ICD-10-CM | POA: Insufficient documentation

## 2024-06-23 DIAGNOSIS — Z3A37 37 weeks gestation of pregnancy: Secondary | ICD-10-CM | POA: Diagnosis not present

## 2024-06-23 NOTE — MAU Note (Signed)
 MAU Triage Note  Kimberly Lewis is a 28 y.o. at [redacted]w[redacted]d here in MAU reporting: ctx since yesterday, reports she's unsure how far apart they are but she's had 5-6 in an hour, also reports increased pelvic pressure. Endorses +FM, denies VB and LOF.   Onset of complaint: yesterday Pain score: 5/10 Vitals:   06/23/24 1314  BP: 123/68  Pulse: 64  Resp: 19  Temp: 98.4 F (36.9 C)  SpO2: 99%     FHT: 132  Lab orders placed from triage: mau labor

## 2024-06-23 NOTE — Progress Notes (Signed)
 Patient was assessed for active labor and managed by nursing staff during this encounter. I have reviewed the chart and agree with the documentation and plan. I have also reviewed the NST for appropriate reactivity.  Fetal Tracing: Baseline: 120 Variability: moderate  Accelerations: yes  Decelerations: none Toco: uterine irritability on monitor, patient endorses about every 10 minutes   Dilation: 1 Effacement (%): Thick Station: Ballotable Exam by:: GEANNIE Punter, RN   Patient stable for discharge home with labor precautions.  Joesph Sear, PA-C  Center for [employer], [facility]

## 2024-06-23 NOTE — MAU Note (Signed)
 MAU Labor Evaluation RN Medical Screening Exam: RN Assessment:  .Kimberly Lewis is a 28 y.o. at [redacted]w[redacted]d here in MAU for evaluation of labor. See RN triage note.   Pain Score: 5  Pain Location: Abdomen  Cervical exam:  Dilation: 1 Effacement (%): Thick Station: Ballotable Exam by:: GEANNIE Punter, RN  Fetal Monitoring: Baseline Rate (A): 125 bpm Variability: Moderate Accelerations: 15 x 15 Decelerations: None Contraction Frequency (min): occ ui  Vitals:   06/23/24 1314  BP: 123/68  Pulse: 64  Resp: 19  Temp: 98.4 F (36.9 C)  SpO2: 99%      Medical Decision Making & Provider Communication:  This RN has communicated with: Provider Name/Title: Wallace, PA  Communicated with MAU provider SBAR report of labor evaluation. Also reviewed contraction pattern and that non-stress test is reactive.  Contraction Frequency (min): occ ui has been documented with minimal cervical change since 05/20/24 not indicating active labor.  Patient denies any other complaints.  Based on this report provider has given order for discharge. A discharge order and diagnosis entered by a provider. Labor discharge precautions reviewed with patient and all questions answered. Patient verbalized understanding.   Plan of Care: Discharge home with strict labor precautions

## 2024-06-23 NOTE — Discharge Instructions (Signed)
 [employer] This circuit takes at least 90 minutes to complete so clear your schedule and make mental preparations so you can relax in your environment.  The second step requires a lot of pillows so gather them up before beginning.  Before starting, you should empty your bladder! Have a nice drink nearby, and make sure it has a straw! If you are having contractions, this circuit should be done through contractions, try not to change positions between steps.  Step One: Open-kneeChest Stay in this position for 30 minutes, start in cat/cow, then drop your chest as low as you can to the bed or the floor and your bottom as high as you can. Knees should be fairly wide apart, and the angle between the torso/thighs should be wider than 90 degrees. Wiggle around, prop with lots of pillows and use this time to get totally relaxed. This position allows the baby to scoot out of the pelvis a bit and gives them room to rotate, shift their head position, etc. If the pregnant person finds it helpful, careful positioning with a rebozo under the belly, with gentle tension from a support person behind can help maintain this position for the full 30 minutes.  Step BJY:[id number] SideLying Roll to your left side, bringing your top leg as high as possible and keeping your bottom leg straight. Roll forward as much as possible, again using a lot of pillows. Sink into the bed and relax some more. If you fall asleep, that's totally okay and you can stay there! If not, stay here for at least another half an hour. Try and get your top right leg up towards your head and get as rolled over onto your belly as much as possible. If you repeat the circuit during labor, try alternating left and right sides. We know the photo the left is actually right side... just flip the image in your head.  Step Three: Moving and Lunges Lunge, walk stairs facing sideways, 2 at a time, (have a spotter  downstairs of you!), take a walk outside with one foot on the curb and the other on the street, sit on a birth ball and hula- anything that's upright and putting your pelvis in open, asymmetrical positions. Spend at least 30 minutes doing this one as well to give your baby a chance to move down. If you are lunging or stair or curb walking, you should lunge/walk/go up stairs in the direction that feels better to you. The key with the lunge is that the toes of the higher leg and mom's belly button should be at right angles. Do not lunge over your knee, that closes the pelvis.  You got this!!!!!!!!!!!!!!!!!!!!!!! :)

## 2024-06-25 ENCOUNTER — Ambulatory Visit

## 2024-06-25 ENCOUNTER — Ambulatory Visit: Admitting: Obstetrics and Gynecology

## 2024-06-25 VITALS — BP 116/75 | HR 68 | Wt 236.0 lb

## 2024-06-25 DIAGNOSIS — O99213 Obesity complicating pregnancy, third trimester: Secondary | ICD-10-CM

## 2024-06-25 DIAGNOSIS — Z6839 Body mass index (BMI) 39.0-39.9, adult: Secondary | ICD-10-CM

## 2024-06-25 DIAGNOSIS — Z3A37 37 weeks gestation of pregnancy: Secondary | ICD-10-CM

## 2024-06-25 DIAGNOSIS — O99210 Obesity complicating pregnancy, unspecified trimester: Secondary | ICD-10-CM

## 2024-06-25 DIAGNOSIS — Z3483 Encounter for supervision of other normal pregnancy, third trimester: Secondary | ICD-10-CM

## 2024-06-25 NOTE — Progress Notes (Signed)
   PRENATAL VISIT NOTE  Subjective:  Kimberly Lewis is a 28 y.o. G3P2002 at [redacted]w[redacted]d being seen today for ongoing prenatal care.  She is currently monitored for the following issues for this low-risk pregnancy and has Inappropriate sinus tachycardia; Papanicolaou smear of cervix with low grade squamous intraepithelial lesion (LGSIL); History of postpartum hypertension; Supervision of normal pregnancy; BMI 39.0-39.9,adult; Obesity affecting pregnancy, antepartum; and History of shoulder dystocia in prior pregnancy, currently pregnant on their problem list.  Patient reports no complaints.  Contractions: Irritability. Vag. Bleeding: None.  Movement: Present. Denies leaking of fluid.   The following portions of the patient's history were reviewed and updated as appropriate: allergies, current medications, past family history, past medical history, past social history, past surgical history and problem list.   Objective:    Vitals:   06/25/24 0929  BP: 116/75  Pulse: 68  Weight: 236 lb (107 kg)    Fetal Status:  Fetal Heart Rate (bpm): 130   Movement: Present    General: Alert, oriented and cooperative. Patient is in no acute distress.  Skin: Skin is warm and dry. No rash noted.   Cardiovascular: Normal heart rate noted  Respiratory: Normal respiratory effort, no problems with respiration noted  Abdomen: Soft, gravid, appropriate for gestational age.  Pain/Pressure: Present     Pelvic: Cervical exam deferred        Extremities: Normal range of motion.  Edema: None  Mental Status: Normal mood and affect. Normal behavior. Normal judgment and thought content.   Assessment and Plan:  Pregnancy: G3P2002 at [redacted]w[redacted]d 1. Encounter for supervision of other normal pregnancy in third trimester (Primary)  - Fetal nonstress test - Baseline 120's, 15x15 accels, no decels. One variable which lasted 20 secs.   2. BMI 39.0-39.9,adult  - Fetal nonstress test  3. Obesity affecting pregnancy,  antepartum, unspecified obesity type  - Fetal non stress test - Continue antenatal testing - Growth US  scheduled next week with MFM.   Preterm labor symptoms and general obstetric precautions including but not limited to vaginal bleeding, contractions, leaking of fluid and fetal movement were reviewed in detail with the patient. Please refer to After Visit Summary for other counseling recommendations.   No follow-ups on file.  Future Appointments  Date Time Provider Department Center  06/29/2024 10:15 AM [facility] PROVIDER 1 [facility]  06/29/2024 10:30 AM [facility] US4 [facility]  06/30/2024  9:30 AM Erik Kieth BROCKS, MD [facility]    Delon Emms, NP

## 2024-06-28 ENCOUNTER — Ambulatory Visit

## 2024-06-29 ENCOUNTER — Ambulatory Visit

## 2024-06-29 ENCOUNTER — Ambulatory Visit: Attending: Obstetrics and Gynecology | Admitting: Obstetrics and Gynecology

## 2024-06-29 VITALS — BP 118/69

## 2024-06-29 DIAGNOSIS — O99213 Obesity complicating pregnancy, third trimester: Secondary | ICD-10-CM | POA: Insufficient documentation

## 2024-06-29 DIAGNOSIS — O365930 Maternal care for other known or suspected poor fetal growth, third trimester, not applicable or unspecified: Secondary | ICD-10-CM

## 2024-06-29 DIAGNOSIS — Z3483 Encounter for supervision of other normal pregnancy, third trimester: Secondary | ICD-10-CM

## 2024-06-29 DIAGNOSIS — E669 Obesity, unspecified: Secondary | ICD-10-CM | POA: Diagnosis not present

## 2024-06-29 DIAGNOSIS — Z3A38 38 weeks gestation of pregnancy: Secondary | ICD-10-CM | POA: Insufficient documentation

## 2024-06-29 DIAGNOSIS — O99210 Obesity complicating pregnancy, unspecified trimester: Secondary | ICD-10-CM

## 2024-06-29 DIAGNOSIS — O09293 Supervision of pregnancy with other poor reproductive or obstetric history, third trimester: Secondary | ICD-10-CM | POA: Diagnosis not present

## 2024-06-29 DIAGNOSIS — Z362 Encounter for other antenatal screening follow-up: Secondary | ICD-10-CM | POA: Insufficient documentation

## 2024-06-29 DIAGNOSIS — O09299 Supervision of pregnancy with other poor reproductive or obstetric history, unspecified trimester: Secondary | ICD-10-CM

## 2024-06-29 NOTE — Progress Notes (Signed)
 Maternal-Fetal Medicine Consultation  Name: Kimberly Lewis  MRN: [id number]  GA: [id number] [redacted]w[redacted]d   Patient is here for fetal growth assessment. - Pregravid BMI 40.  Patient does not have gestational diabetes. - History of shoulder dystocia.  Ultrasound Normal fetal growth and amniotic fluid.  The estimated fetal weight is at the 14th percentile and the abdominal circumference measurement at the 22nd percentile.  Cephalic presentation.  History of shoulder dystocia I counseled the patient that recurrence of his shoulder dystocia in subsequent pregnancies he is about 15%.  Shoulder dystocia can complicate delivery even in the absence of fetal macrosomia.  Patient does not have gestational diabetes and that reduces the risks. I encouraged her to take epidural analgesia. Induction of labor is not recommended before [redacted] weeks gestation.  If cervix is favorable, patient can undergo induction of labor at [redacted] weeks gestation.  Recommendations -NST next week if undelivered.     Consultation including face-to-face (more than 50%) counseling 20 minutes.

## 2024-06-30 ENCOUNTER — Ambulatory Visit: Admitting: Obstetrics and Gynecology

## 2024-06-30 VITALS — BP 114/69 | HR 88 | Wt 241.0 lb

## 2024-06-30 DIAGNOSIS — Z3A38 38 weeks gestation of pregnancy: Secondary | ICD-10-CM | POA: Diagnosis not present

## 2024-06-30 DIAGNOSIS — Z8679 Personal history of other diseases of the circulatory system: Secondary | ICD-10-CM | POA: Diagnosis not present

## 2024-06-30 DIAGNOSIS — Z6841 Body Mass Index (BMI) 40.0 and over, adult: Secondary | ICD-10-CM | POA: Diagnosis not present

## 2024-06-30 DIAGNOSIS — Z8759 Personal history of other complications of pregnancy, childbirth and the puerperium: Secondary | ICD-10-CM

## 2024-06-30 DIAGNOSIS — O09293 Supervision of pregnancy with other poor reproductive or obstetric history, third trimester: Secondary | ICD-10-CM

## 2024-06-30 DIAGNOSIS — Z3483 Encounter for supervision of other normal pregnancy, third trimester: Secondary | ICD-10-CM

## 2024-06-30 DIAGNOSIS — I4711 Inappropriate sinus tachycardia, so stated: Secondary | ICD-10-CM

## 2024-06-30 DIAGNOSIS — O09299 Supervision of pregnancy with other poor reproductive or obstetric history, unspecified trimester: Secondary | ICD-10-CM

## 2024-06-30 NOTE — Progress Notes (Signed)
   PRENATAL VISIT NOTE  Subjective:  Asiana Benninger is a 28 y.o. G3P2002 at [redacted]w[redacted]d being seen today for ongoing prenatal care.  She is currently monitored for the following issues for this high-risk pregnancy and has Inappropriate sinus tachycardia; Papanicolaou smear of cervix with low grade squamous intraepithelial lesion (LGSIL); History of postpartum hypertension; Supervision of normal pregnancy; BMI 39.0-39.9,adult; Obesity affecting pregnancy, antepartum; and History of shoulder dystocia in prior pregnancy, currently pregnant on their problem list.  Patient reports doing well overall.  Contractions: Irregular. Vag. Bleeding: None.  Movement: Present. Denies leaking of fluid.   The following portions of the patient's history were reviewed and updated as appropriate: allergies, current medications, past family history, past medical history, past social history, past surgical history and problem list.   Objective:   Vitals:   06/30/24 0934  BP: 114/69  Pulse: 88  Weight: 241 lb (109.3 kg)    Fetal Status: Fetal Heart Rate (bpm): 141   Movement: Present     General:  Alert, oriented and cooperative. Patient is in no acute distress.  Skin: Skin is warm and dry. No rash noted.   Cardiovascular: Normal heart rate noted  Respiratory: Normal respiratory effort, no problems with respiration noted  Abdomen: Soft, gravid, appropriate for gestational age.  Pain/Pressure: Present      Assessment and Plan:  Pregnancy: G3P2002 at [redacted]w[redacted]d 1. Encounter for supervision of other normal pregnancy in third trimester (Primary) 2. [redacted] weeks gestation of pregnancy Discussed SCE/IOL -- cervix is not currently favorable, but she would like to move forward with IOL at 39-40 weeks. There is availability next Thurs (10/23). Plan for ROB on 10/22 to sweep membranes, then IOL 10/23 if no spontaneous labor GBS neg  3. BMI 40.0-44.9, adult (HCC) 5. History of postpartum hypertension Normal baseline  labs Declines ldASA Growth yesterday AGA (14%), EFW 2825g at [redacted]w[redacted]d Weekly NST - NST today reactive & reassuring Delivery at 39-40 weeks recommended by MFM; IOL can be considered if cervix favorable (see above)  4. History of shoulder dystocia in prior pregnancy, currently pregnant 8lb13oz/4000g; 30 seconds, NICU admisison EFW 2825g (14%) at [redacted]w[redacted]d  6. Inappropriate sinus tachycardia No meds, normal HR today  Term labor symptoms and general obstetric precautions including but not limited to vaginal bleeding, contractions, leaking of fluid and fetal movement were reviewed in detail with the patient. Please refer to After Visit Summary for other counseling recommendations.   Return in about 1 week (around 07/07/2024) for return OB with NST.  No future appointments.  Kieth JAYSON Carolin, MD

## 2024-07-01 ENCOUNTER — Telehealth (HOSPITAL_COMMUNITY): Payer: Self-pay | Admitting: *Deleted

## 2024-07-01 ENCOUNTER — Encounter (HOSPITAL_COMMUNITY): Payer: Self-pay | Admitting: *Deleted

## 2024-07-01 NOTE — Telephone Encounter (Signed)
 Preadmission screen

## 2024-07-02 ENCOUNTER — Inpatient Hospital Stay (HOSPITAL_COMMUNITY)
Admission: AD | Admit: 2024-07-02 | Discharge: 2024-07-04 | DRG: 807 | Disposition: A | Discharge status: Home / Self Care | Attending: Obstetrics and Gynecology | Admitting: Obstetrics and Gynecology

## 2024-07-02 ENCOUNTER — Encounter (HOSPITAL_COMMUNITY): Payer: Self-pay

## 2024-07-02 ENCOUNTER — Ambulatory Visit

## 2024-07-02 ENCOUNTER — Encounter (HOSPITAL_COMMUNITY): Payer: Self-pay | Admitting: Anesthesiology

## 2024-07-02 ENCOUNTER — Inpatient Hospital Stay (HOSPITAL_COMMUNITY): Admitting: Anesthesiology

## 2024-07-02 ENCOUNTER — Encounter (HOSPITAL_COMMUNITY): Payer: Self-pay | Admitting: Obstetrics and Gynecology

## 2024-07-02 DIAGNOSIS — Z3A38 38 weeks gestation of pregnancy: Secondary | ICD-10-CM

## 2024-07-02 DIAGNOSIS — O99214 Obesity complicating childbirth: Principal | ICD-10-CM | POA: Diagnosis present

## 2024-07-02 DIAGNOSIS — Z8249 Family history of ischemic heart disease and other diseases of the circulatory system: Secondary | ICD-10-CM

## 2024-07-02 DIAGNOSIS — Z880 Allergy status to penicillin: Secondary | ICD-10-CM

## 2024-07-02 DIAGNOSIS — O26893 Other specified pregnancy related conditions, third trimester: Secondary | ICD-10-CM | POA: Diagnosis present

## 2024-07-02 DIAGNOSIS — Z833 Family history of diabetes mellitus: Secondary | ICD-10-CM | POA: Diagnosis not present

## 2024-07-02 DIAGNOSIS — O135 Gestational [pregnancy-induced] hypertension without significant proteinuria, complicating the puerperium: Secondary | ICD-10-CM | POA: Diagnosis not present

## 2024-07-02 DIAGNOSIS — Z3483 Encounter for supervision of other normal pregnancy, third trimester: Secondary | ICD-10-CM

## 2024-07-02 DIAGNOSIS — Z8759 Personal history of other complications of pregnancy, childbirth and the puerperium: Secondary | ICD-10-CM

## 2024-07-02 DIAGNOSIS — Z87891 Personal history of nicotine dependence: Secondary | ICD-10-CM

## 2024-07-02 DIAGNOSIS — Z3490 Encounter for supervision of normal pregnancy, unspecified, unspecified trimester: Principal | ICD-10-CM | POA: Diagnosis present

## 2024-07-02 LAB — RPR: RPR Ser Ql: NONREACTIVE

## 2024-07-02 LAB — CBC
HCT: 42.1 % (ref 36.0–46.0)
Hemoglobin: 14.6 g/dL (ref 12.0–15.0)
MCH: 29.7 pg (ref 26.0–34.0)
MCHC: 34.7 g/dL (ref 30.0–36.0)
MCV: 85.6 fL (ref 80.0–100.0)
Platelets: 234 K/uL (ref 150–400)
RBC: 4.92 MIL/uL (ref 3.87–5.11)
RDW: 15.3 % (ref 11.5–15.5)
WBC: 10.8 K/uL — ABNORMAL HIGH (ref 4.0–10.5)
nRBC: 0 % (ref 0.0–0.2)

## 2024-07-02 LAB — TYPE AND SCREEN
ABO/RH(D): B POS
Antibody Screen: NEGATIVE

## 2024-07-02 MED ORDER — DIPHENHYDRAMINE HCL 25 MG PO CAPS: 25.0000 mg | ORAL_CAPSULE | Freq: Four times a day (QID) | ORAL | Status: DC | PRN

## 2024-07-02 MED ORDER — METHYLERGONOVINE MALEATE 0.2 MG/ML IJ SOLN: 0.2000 mg | INTRAMUSCULAR | Status: DC | PRN

## 2024-07-02 MED ORDER — ONDANSETRON HCL 4 MG PO TABS: 4.0000 mg | ORAL_TABLET | ORAL | Status: DC | PRN

## 2024-07-02 MED ORDER — ACETAMINOPHEN 325 MG PO TABS: 650.0000 mg | ORAL_TABLET | ORAL | Status: DC | PRN

## 2024-07-02 MED ORDER — PHENYLEPHRINE 80 MCG/ML (10ML) SYRINGE FOR IV PUSH (FOR BLOOD PRESSURE SUPPORT): 80.0000 ug | PREFILLED_SYRINGE | INTRAVENOUS | Status: DC | PRN

## 2024-07-02 MED ORDER — BENZOCAINE-MENTHOL 20-0.5 % EX AERO: 1.0000 | INHALATION_SPRAY | CUTANEOUS | Status: DC | PRN

## 2024-07-02 MED ORDER — FLEET ENEMA RE ENEM: 1.0000 | ENEMA | Freq: Every day | RECTAL | Status: DC | PRN

## 2024-07-02 MED ORDER — TERBUTALINE SULFATE 1 MG/ML IJ SOLN: 0.2500 mg | Freq: Once | INTRAMUSCULAR | Status: DC | PRN

## 2024-07-02 MED ORDER — BISACODYL 10 MG RE SUPP: 10.0000 mg | Freq: Every day | RECTAL | Status: DC | PRN

## 2024-07-02 MED ORDER — EPHEDRINE 5 MG/ML INJ: 10.0000 mg | INTRAVENOUS | Status: DC | PRN

## 2024-07-02 MED ORDER — METHYLERGONOVINE MALEATE 0.2 MG PO TABS: 0.2000 mg | ORAL_TABLET | ORAL | Status: DC | PRN

## 2024-07-02 MED ORDER — FENTANYL CITRATE (PF) 100 MCG/2ML IJ SOLN
50.0000 ug | INTRAMUSCULAR | Status: DC | PRN
  Filled 2024-07-02: qty 2

## 2024-07-02 MED ORDER — DIBUCAINE (PERIANAL) 1 % EX OINT: 1.0000 | TOPICAL_OINTMENT | CUTANEOUS | Status: DC | PRN

## 2024-07-02 MED ORDER — WITCH HAZEL-GLYCERIN EX PADS: 1.0000 | MEDICATED_PAD | CUTANEOUS | Status: DC | PRN

## 2024-07-02 MED ORDER — SODIUM CHLORIDE 0.9% FLUSH: 3.0000 mL | Freq: Two times a day (BID) | INTRAVENOUS | Status: DC

## 2024-07-02 MED ORDER — DIPHENHYDRAMINE HCL 50 MG/ML IJ SOLN: 12.5000 mg | INTRAMUSCULAR | Status: DC | PRN

## 2024-07-02 MED ORDER — SIMETHICONE 80 MG PO CHEW: 80.0000 mg | CHEWABLE_TABLET | ORAL | Status: DC | PRN

## 2024-07-02 MED ORDER — LACTATED RINGERS IV SOLN: 500.0000 mL | Freq: Once | INTRAVENOUS | Status: DC

## 2024-07-02 MED ORDER — LACTATED RINGERS IV SOLN: 500.0000 mL | INTRAVENOUS | Status: DC | PRN

## 2024-07-02 MED ORDER — OXYTOCIN BOLUS FROM INFUSION
333.0000 mL | Freq: Once | INTRAVENOUS | Status: AC
  Administered 2024-07-02: 333 mL via INTRAVENOUS

## 2024-07-02 MED ORDER — IBUPROFEN 600 MG PO TABS
600.0000 mg | ORAL_TABLET | Freq: Four times a day (QID) | ORAL | Status: DC
  Administered 2024-07-02 – 2024-07-03 (×5): 600 mg via ORAL
  Filled 2024-07-02 (×6): qty 1

## 2024-07-02 MED ORDER — MEASLES, MUMPS & RUBELLA VAC ~~LOC~~ SUSR: 0.5000 mL | Freq: Once | SUBCUTANEOUS | Status: DC

## 2024-07-02 MED ORDER — ONDANSETRON HCL 4 MG/2ML IJ SOLN: 4.0000 mg | Freq: Four times a day (QID) | INTRAMUSCULAR | Status: DC | PRN

## 2024-07-02 MED ORDER — ONDANSETRON HCL 4 MG/2ML IJ SOLN: 4.0000 mg | INTRAMUSCULAR | Status: DC | PRN

## 2024-07-02 MED ORDER — COCONUT OIL OIL: 1.0000 | TOPICAL_OIL | Status: DC | PRN

## 2024-07-02 MED ORDER — ACETAMINOPHEN 325 MG PO TABS
650.0000 mg | ORAL_TABLET | ORAL | Status: DC | PRN
  Administered 2024-07-02 – 2024-07-03 (×2): 650 mg via ORAL
  Filled 2024-07-02 (×2): qty 2

## 2024-07-02 MED ORDER — PRENATAL MULTIVITAMIN CH
1.0000 | ORAL_TABLET | Freq: Every day | ORAL | Status: DC
  Administered 2024-07-02 – 2024-07-03 (×2): 1 via ORAL
  Filled 2024-07-02 (×3): qty 1

## 2024-07-02 MED ORDER — SODIUM CHLORIDE 0.9% FLUSH: 3.0000 mL | INTRAVENOUS | Status: DC | PRN

## 2024-07-02 MED ORDER — MEDROXYPROGESTERONE ACETATE 150 MG/ML IM SUSP: 150.0000 mg | INTRAMUSCULAR | Status: DC | PRN

## 2024-07-02 MED ORDER — MISOPROSTOL 25 MCG QUARTER TABLET: 25.0000 ug | ORAL_TABLET | Freq: Once | ORAL | Status: DC

## 2024-07-02 MED ORDER — SOD CITRATE-CITRIC ACID 500-334 MG/5ML PO SOLN: 30.0000 mL | ORAL | Status: DC | PRN

## 2024-07-02 MED ORDER — TETANUS-DIPHTH-ACELL PERTUSSIS 5-2-15.5 LF-MCG/0.5 IM SUSP: 0.5000 mL | Freq: Once | INTRAMUSCULAR | Status: DC

## 2024-07-02 MED ORDER — SENNOSIDES-DOCUSATE SODIUM 8.6-50 MG PO TABS
2.0000 | ORAL_TABLET | ORAL | Status: DC
  Administered 2024-07-03 – 2024-07-04 (×2): 2 via ORAL
  Filled 2024-07-02 (×2): qty 2

## 2024-07-02 MED ORDER — OXYTOCIN-SODIUM CHLORIDE 30-0.9 UT/500ML-% IV SOLN
2.5000 [IU]/h | INTRAVENOUS | Status: DC
  Filled 2024-07-02: qty 500

## 2024-07-02 MED ORDER — FENTANYL-BUPIVACAINE-NACL 0.5-0.125-0.9 MG/250ML-% EP SOLN
12.0000 mL/h | EPIDURAL | Status: DC | PRN
  Administered 2024-07-02: 12 mL/h via EPIDURAL
  Filled 2024-07-02: qty 250

## 2024-07-02 MED ORDER — FERROUS SULFATE 325 (65 FE) MG PO TABS
325.0000 mg | ORAL_TABLET | ORAL | Status: DC
  Administered 2024-07-03 – 2024-07-04 (×2): 325 mg via ORAL
  Filled 2024-07-02 (×2): qty 1

## 2024-07-02 MED ORDER — LIDOCAINE HCL (PF) 1 % IJ SOLN: 30.0000 mL | INTRAMUSCULAR | Status: DC | PRN

## 2024-07-02 MED ORDER — MISOPROSTOL 50MCG HALF TABLET: 50.0000 ug | ORAL_TABLET | Freq: Once | ORAL | Status: DC

## 2024-07-02 MED ADMIN — Lidocaine HCl Local Preservative Free (PF) Inj 1%: 5 mL | EPIDURAL | NDC 00409471332

## 2024-07-02 NOTE — Anesthesia Preprocedure Evaluation (Deleted)
 Anesthesia Evaluation  Patient identified by MRN, date of birth, ID band Patient awake    Reviewed: Allergy & Precautions, NPO status , Patient's Chart, lab work & pertinent test results  Airway Mallampati: III  TM Distance: >3 FB Neck ROM: Full    Dental no notable dental hx. (+) Teeth Intact, Dental Advisory Given   Pulmonary former smoker   Pulmonary exam normal breath sounds clear to auscultation       Cardiovascular negative cardio ROS Normal cardiovascular exam Rhythm:Regular Rate:Normal     Neuro/Psych  PSYCHIATRIC DISORDERS Anxiety Depression       GI/Hepatic negative GI ROS, Neg liver ROS,,,  Endo/Other  negative endocrine ROS    Renal/GU negative Renal ROS     Musculoskeletal negative musculoskeletal ROS (+)    Abdominal  (+) + obese (BMI 45,5)  Peds  Hematology Lab Results      Component                Value               Date                      WBC                      10.8 (H)            07/02/2024                HGB                      14.6                07/02/2024                HCT                      42.1                07/02/2024                MCV                      85.6                07/02/2024                PLT                      234                 07/02/2024              Anesthesia Other Findings All: ASA, amoxicillin  Reproductive/Obstetrics (+) Pregnancy                              Anesthesia Physical Anesthesia Plan  ASA: 3  Anesthesia Plan: Epidural   Post-op Pain Management:    Induction:   PONV Risk Score and Plan:   Airway Management Planned:   Additional Equipment:   Intra-op Plan:   Post-operative Plan:   Informed Consent: I have reviewed the patients History and Physical, chart, labs and discussed the procedure including the risks, benefits and alternatives for the proposed anesthesia with the patient or authorized  representative who has indicated his/her understanding and acceptance.  Plan Discussed with:   Anesthesia Plan Comments: (38.4 wk G3P2 w BMI 45.5 for LEA)        Anesthesia Quick Evaluation

## 2024-07-02 NOTE — H&P (Signed)
 Kimberly Lewis is a 28 y.o. female G3P2002 with IUP at [redacted]w[redacted]d by LMP/6 week US  presenting for labor.  She reports positive fetal movement. She denies leakage of fluid or vaginal bleeding.  Prenatal History/Complications:  Term SVD X2 PP GHTN Obesity Shoulder dystocia (4Kg)   PNC at [facility]   Past Medical History: Past Medical History:  Diagnosis Date   Depression    Fibroids    History of postpartum hypertension    Inappropriate sinus tachycardia    PCOS (polycystic ovarian syndrome)    Posttraumatic stress disorder 12/14/2015   Trichomonas infection 2017   UTI (urinary tract infection)     Past Surgical History: Past Surgical History:  Procedure Laterality Date   CHOLECYSTECTOMY     WISDOM TOOTH EXTRACTION Bilateral     Obstetrical History: OB History     Gravida  3   Para  2   Term  2   Preterm  0   AB  0   Living  2      SAB  0   IAB  0   Ectopic  0   Multiple  0   Live Births  2        Obstetric Comments  2023 PP HTN          Social History: Social History   Socioeconomic History   Marital status: Single    Spouse name: Not on file   Number of children: Not on file   Years of education: Not on file   Highest education level: Not on file  Occupational History   Not on file  Tobacco Use   Smoking status: Former    Types: Cigarettes    Passive exposure: Past   Smokeless tobacco: Not on file  Vaping Use   Vaping status: Never Used  Substance and Sexual Activity   Alcohol use: Not Currently   Drug use: Not Currently   Sexual activity: Yes  Other Topics Concern   Not on file  Social History Narrative   Not on file   Social Drivers of Health   Financial Resource Strain: Not on File (10/07/2019)   Received from [employer]    Financial Resource Strain: 0  Food Insecurity: No Food Insecurity (05/20/2024)   Hunger Vital Sign    Worried About Running Out of Food in the Last Year: Never true    Ran  Out of Food in the Last Year: Never true  Transportation Needs: No Transportation Needs (05/20/2024)   PRAPARE - Administrator, Civil Service (Medical): No    Lack of Transportation (Non-Medical): No  Physical Activity: Not on File (10/07/2019)   Received from [facility]   Physical Activity    Physical Activity: 0  Recent Concern: Physical Activity - Inactive (08/05/2019)   Received from [facility] visits prior to 11/16/2022.   Exercise Vital Sign    On average, how many days per week do you engage in moderate to strenuous exercise (like a brisk walk)?: 0 days    On average, how many minutes do you engage in exercise at this level?: 0 min  Stress: Not on File (10/07/2019)   Received from [facility]   Stress    Stress: 0  Social Connections: Unknown (05/20/2024)   Social Connection and Isolation Panel    Frequency of Communication with Friends and Family: More than three times a week    Frequency of Social Gatherings with Friends and Family:  More than three times a week    Attends Religious Services: Patient declined    Active Member of Clubs or Organizations: Patient declined    Attends Banker Meetings: Patient declined    Marital Status: Living with partner    Family History: Family History  Problem Relation Age of Onset   Healthy Mother    Other Father        unknown   Hypertension Maternal Grandmother    Cancer Maternal Grandfather    Cancer Paternal Grandmother    Diabetes Other    Asthma Neg Hx    Heart disease Neg Hx    Stroke Neg Hx     Allergies: Allergies  Allergen Reactions   Aspirin  Nausea And Vomiting    abd pain and vomiting abd pain and vomiting    Amoxicillin Itching and Swelling    Per patient, caused vaginal itching and swelling    Medications Prior to Admission  Medication Sig Dispense Refill Last Dose/Taking   famotidine  (PEPCID ) 20 MG tablet Take 1 tablet (20 mg total) by mouth 2 (two) times daily. 60 tablet 2     Prenatal Vit-Fe Fumarate-FA (PREPLUS) 27-1 MG TABS Take 1 tablet by mouth daily. 30 tablet 13     Review of Systems   Constitutional: Negative for fever and chills Eyes: Negative for visual disturbances Respiratory: Negative for shortness of breath, dyspnea Cardiovascular: Negative for chest pain or palpitations  Gastrointestinal: Negative for vomiting, diarrhea and constipation.  POSITIVE for abdominal pain (contractions) Genitourinary: Negative for dysuria and urgency Musculoskeletal: Negative for back pain, joint pain, myalgias  Neurological: Negative for dizziness and headaches  Blood pressure (!) 173/73, pulse (!) 141, last menstrual period 10/06/2023, currently breastfeeding. General appearance: alert, cooperative, and no distress Lungs: normal respiratory effort Heart: regular rate and rhythm Abdomen: soft, non-tender; bowel sounds normal Extremities: Homans sign is negative, no sign of DVT DTR's 2+ Presentation: cephalic Fetal monitoring  Baseline: 140 bpm, Variability: Good {> 6 bpm), Accelerations: Reactive, and Decelerations: Absent Uterine activity  3-4 minutes Dilation: 6 Effacement (%): 80 Station: -3 Exam by:: Kent Solian RN   Prenatal labs: ABO, Rh: B/Positive/-- (04/16 0931) Antibody: Negative (04/16 0931) Rubella: 8.80 (04/16 0931) RPR: Non Reactive (08/04 0917)  HBsAg: Negative (04/16 0931)  HIV: Non Reactive (08/04 0917)  GBS: Negative/-- (10/03 1018)   NURSING  PROVIDER  Office Location [place] Dating by LMP c/w 6wk US   PNC Model Traditional Anatomy U/S incomplete  Initiated care at  10wks                Language  English              LAB RESULTS   Support Person F[facility] NIPS: LR XY AFP: neg    NT/IT (FT only)     Carrier Screen Horizon: neg  Rhogam  B/Positive/-- (04/16 0931) A1C/GTT Early HgbA1C: 5.5 Third trimester 2 hr GTT: neg  Flu Vaccine Declines    TDaP Vaccine  04/19/24 Blood Type B/Positive/-- (04/16 0931)  RSV  Vaccine  Antibody Negative (04/16 0931)  COVID Vaccine  Rubella 8.80 (04/16 0931)  Feeding Plan breast RPR Non Reactive (08/04 0917)  Contraception Vasectomy HBsAg Negative (04/16 0931)  Circumcision no HIV Non Reactive (08/04 0917)  Pediatrician  [facility] HCVAb Non Reactive (04/16 0931)  Prenatal Classes     BTL Consent  Pap Diagnosis  Date Value Ref Range Status  02/18/2022   Final   - Negative for intraepithelial  lesion or malignancy (NILM)    BTL Pre-payment  GC/CT Initial:  neg 36wks:    VBAC Consent  GBS Negative/-- (10/03 1018) For PCN allergy, check sensitivities   BRx Optimized? [ ]  yes   [ ]  no    DME Rx [ ]  BP cuff [ ]  Weight Scale Waterbirth  [ ]  Class [ ]  Consent [ ]  CNM visit  PHQ9 & GAD7 [  ] new OB [  ] 28 weeks  [  ] 36 weeks Induction  [ ]  Orders Entered [ ] Foley Y/N      Prenatal Transfer Tool  Maternal Diabetes: No Genetic Screening: Normal Maternal Ultrasounds/Referrals: Normal Fetal Ultrasounds or other Referrals:  None Maternal Substance Abuse:  No Significant Maternal Medications:  None Significant Maternal Lab Results: Group B Strep negative Number of Prenatal Visits:greater than 3 verified prenatal visits Maternal Vaccinations:TDap Other Comments:  None  Results for orders placed or performed during the hospital encounter of 07/02/24 (from the past 24 hours)  CBC   Collection Time: 07/02/24  4:07 AM  Result Value Ref Range   WBC 10.8 (H) 4.0 - 10.5 K/uL   RBC 4.92 3.87 - 5.11 MIL/uL   Hemoglobin 14.6 12.0 - 15.0 g/dL   HCT 57.8 63.9 - 53.9 %   MCV 85.6 80.0 - 100.0 fL   MCH 29.7 26.0 - 34.0 pg   MCHC 34.7 30.0 - 36.0 g/dL   RDW 84.6 88.4 - 84.4 %   Platelets 234 150 - 400 K/uL   nRBC 0.0 0.0 - 0.2 %    Assessment: Kimberly Lewis is a 28 y.o. [id number] with an IUP at [redacted]w[redacted]d presenting for labor  Plan: #Labor: expectant management--would like a nap after her epidural #Pain:  Per request #FWB Cat 1 #ID: GBS: neg  #MOF:   breast #MOC: vasectomy #Circ: no   Cathlean Ely 07/02/2024, 4:19 AM

## 2024-07-02 NOTE — Anesthesia Preprocedure Evaluation (Signed)
 Anesthesia Evaluation  Patient identified by MRN, date of birth, ID band Patient awake    Reviewed: Allergy & Precautions, NPO status , Patient's Chart, lab work & pertinent test results  Airway Mallampati: III  TM Distance: >3 FB Neck ROM: Full    Dental no notable dental hx. (+) Teeth Intact, Dental Advisory Given   Pulmonary former smoker   Pulmonary exam normal breath sounds clear to auscultation       Cardiovascular negative cardio ROS Normal cardiovascular exam Rhythm:Regular Rate:Normal     Neuro/Psych  PSYCHIATRIC DISORDERS Anxiety Depression       GI/Hepatic negative GI ROS, Neg liver ROS,,,  Endo/Other  negative endocrine ROS    Renal/GU negative Renal ROS     Musculoskeletal   Abdominal  (+) + obese (BMI 45.5)  Peds  Hematology Lab Results      Component                Value               Date                      WBC                      10.8 (H)            07/02/2024                HGB                      14.6                07/02/2024                HCT                      42.1                07/02/2024                MCV                      85.6                07/02/2024                PLT                      234                 07/02/2024              Anesthesia Other Findings All: Amoxicillin, Asa  Reproductive/Obstetrics (+) Pregnancy                              Anesthesia Physical Anesthesia Plan  ASA: 3  Anesthesia Plan: Epidural   Post-op Pain Management:    Induction:   PONV Risk Score and Plan:   Airway Management Planned:   Additional Equipment:   Intra-op Plan:   Post-operative Plan:   Informed Consent: I have reviewed the patients History and Physical, chart, labs and discussed the procedure including the risks, benefits and alternatives for the proposed anesthesia with the patient or authorized representative who has indicated his/her  understanding and acceptance.       Plan  Discussed with:   Anesthesia Plan Comments: (38.4 wk G3P2 w BMI of 45.5 for LEA)        Anesthesia Quick Evaluation

## 2024-07-02 NOTE — Lactation Note (Signed)
 This note was copied from a baby's chart. Lactation Consultation Note  Patient Name: Kimberly Lewis Date: 07/02/2024 Age:29 hours Reason for consult: Initial assessment;Early term 37-38.6wks;Infant < 6lbs;Maternal endocrine disorder;Breastfeeding assistance  P3- Infant was born at [redacted]w[redacted]d GA weighing [phone]. MOB had already been set up with the hospital DEBP and reports using it after every breastfeeding attempt. Infant was due for a feeding so LC assisted with placing infant on the right breast in the cross cradle hold. Infant was very alert, but not wanting to eat. With the 15 minutes of trying to latch infant, he was able to nurse for 3 minutes. Infant had a strong rhythmic suck when he was on the breast. LC left the room to get formula for infant and when LC came back, infant was silently choking on thick fluid. Infant was dusky around the mouth and nose and he did not have his head turned to work the fluid up. LC quickly started patting infant's back firmly as the RN used the bulb suction to help him. Infant was able to recover well.  As MOB pumped for 15 minutes, LC bottle fed infant formula. LC placed infant in a side lying pace feed position with his head elevated. LC used the purple slow flow nipple. Infant was eager to drink, but gagged as soon as the nipple touched the roof of his mouth or his tongue. Infant was able to take 2 mL in 15 minutes. LC reviewed infant's feeding plan with MOB.  Feeding plan as follows: -Entire feeding to not last more than 30 minutes total. -Pump for 15 minutes after every feeding. -Supplementation volume for day 1 is a minimum of 11-12 mL, day 2 is 16-18 mL, and day 3 is 22-24 mL.   LC reviewed the first 24 hr birthday nap, day 2 cluster feeding, feeding infant on cue 8-12x in 24 hrs, not allowing infant to go over 3 hrs without a  feeding, CDC milk storage guidelines, LC services handout and engorgement/breast care. LC encouraged MOB to call for  further assistance as needed.  Maternal Data Has patient been taught Hand Expression?: Yes Does the patient have breastfeeding experience prior to this delivery?: Yes How long did the patient breastfeed?: 6 months of exclusive pumping for both older children  Feeding Mother's Current Feeding Choice: Breast Milk Nipple Type: Extra Slow Flow  LATCH Score Latch: Repeated attempts needed to sustain latch, nipple held in mouth throughout feeding, stimulation needed to elicit sucking reflex.  Audible Swallowing: None  Type of Nipple: Everted at rest and after stimulation  Comfort (Breast/Nipple): Soft / non-tender  Hold (Positioning): Full assist, staff holds infant at breast  LATCH Score: 5   Lactation Tools Discussed/Used Tools: Pump;Flanges Breast pump type: Double-Electric Breast Pump;Manual Pump Education: Setup, frequency, and cleaning;Milk Storage Reason for Pumping: low birth infant Pumping frequency: 15-20 min every 3 hrs  Interventions Interventions: Breast feeding basics reviewed;Assisted with latch;Hand express;Breast compression;Support pillows;Adjust position;Position options;Expressed milk;Hand pump;DEBP;Education;Pace feeding;LC Services brochure;LPT handout/interventions  Discharge Discharge Education: Engorgement and breast care;Warning signs for feeding baby Pump: DEBP;Manual;Hands Free;Personal  Consult Status Consult Status: Follow-up Date: 07/03/24 Follow-up type: In-patient    Recardo Hoit BS, IBCLC 07/02/2024, 10:52 PM

## 2024-07-02 NOTE — MAU Note (Signed)
..  Kimberly Lewis is a 28 y.o. at [redacted]w[redacted]d here in MAU called out to [facility] for medical emergency due to intensity of contractions. Pt brought directly to room, Olam, NP at bedside.

## 2024-07-02 NOTE — Discharge Summary (Signed)
 Postpartum Discharge Summary  Date of Service updated***     Patient Name: Kimberly Lewis DOB: Jan 16, 1996 MRN: [id number]  Date of admission: 07/02/2024 Delivery date:07/02/2024 Delivering provider: NEWTON MERING Date of discharge: 07/02/2024  Admitting diagnosis: Encounter for induction of labor [Z34.90] Intrauterine pregnancy: [redacted]w[redacted]d     Secondary diagnosis:  Principal Problem:   Encounter for induction of labor Active Problems:   Vaginal delivery  Additional problems: ***    Discharge diagnosis: Term Pregnancy Delivered                                              Post partum procedures:{Postpartum procedures:23558} Augmentation: none Complications: None  Hospital course: Onset of Labor With Vaginal Delivery      28 y.o. yo G3P3003 at [redacted]w[redacted]d was admitted in Active Labor on 07/02/2024. Labor course was complicated by noting  Membrane Rupture Time/Date: 7:18 AM,07/02/2024  Delivery Method:Vaginal, Spontaneous Operative Delivery:N/A Episiotomy: None Lacerations:  None Patient had a postpartum course complicated by ***.  She is ambulating, tolerating a regular diet, passing flatus, and urinating well. Patient is discharged home in stable condition on 07/02/24.  Newborn Data: Birth date:07/02/2024 Birth time:7:42 AM Gender:Female Living status:Living Apgars:9 ,9  Weight:2460 g  Magnesium Sulfate received: No BMZ received: No Rhophylac:N/A MMR:N/A T-DaP:Given prenatally Flu: No RSV Vaccine received: No Transfusion:{Transfusion received:30440034}  Immunizations received: Immunization History  Administered Date(s) Administered   HPV Quadrivalent 03/08/2008   Tdap 07/30/2022, 04/19/2024    Physical exam  Vitals:   07/02/24 0718 07/02/24 0745 07/02/24 0801 07/02/24 0815  BP:  109/77 118/82 (!) 115/91  Pulse:  83  72  Resp:    18  Temp: 98 F (36.7 C)     TempSrc: Oral     Weight:      Height:       General: {Exam;  general:21111117} Lochia: {Desc; appropriate/inappropriate:30686::appropriate} Uterine Fundus: {Desc; firm/soft:30687} Incision: {Exam; incision:21111123} DVT Evaluation: {Exam; dvt:2111122} Labs: Lab Results  Component Value Date   WBC 10.8 (H) 07/02/2024   HGB 14.6 07/02/2024   HCT 42.1 07/02/2024   MCV 85.6 07/02/2024   PLT 234 07/02/2024      Latest Ref Rng & Units 02/25/2024   10:28 AM  CMP  Glucose 70 - 99 mg/dL 84   BUN 6 - 20 mg/dL 8   Creatinine 9.42 - 8.99 mg/dL 9.18   Sodium 865 - 855 mmol/L 135   Potassium 3.5 - 5.2 mmol/L 4.0   Chloride 96 - 106 mmol/L 102   CO2 20 - 29 mmol/L 18   Calcium 8.7 - 10.2 mg/dL 9.1   Total Protein 6.0 - 8.5 g/dL 6.6   Total Bilirubin 0.0 - 1.2 mg/dL 0.2   Alkaline Phos 44 - 121 IU/L 83   AST 0 - 40 IU/L 9   ALT 0 - 32 IU/L 7    Edinburgh Score:    09/25/2022   11:13 AM  Edinburgh Postnatal Depression Scale Screening Tool  I have been able to laugh and see the funny side of things. 0  I have looked forward with enjoyment to things. 0  I have blamed myself unnecessarily when things went wrong. 0  I have been anxious or worried for no good reason. 0  I have felt scared or panicky for no good reason. 0  Things have been getting on top of me.  0  I have been so unhappy that I have had difficulty sleeping. 0  I have felt sad or miserable. 0  I have been so unhappy that I have been crying. 0  The thought of harming myself has occurred to me. 0  Edinburgh Postnatal Depression Scale Total 0      Data saved with a previous flowsheet row definition   No data recorded  After visit meds:  Allergies as of 07/02/2024       Reactions   Aspirin  Nausea And Vomiting   abd pain and vomiting abd pain and vomiting   Amoxicillin Itching, Swelling   Per patient, caused vaginal itching and swelling     Med Rec must be completed prior to using this [facility]***        Discharge home in stable condition Infant Feeding: {Baby  feeding:23562} Infant Disposition:{CHL IP OB HOME WITH FNUYZM:76418} Discharge instruction: per After Visit Summary and Postpartum booklet. Activity: Advance as tolerated. Pelvic rest for 6 weeks.  Diet: {OB ipzu:78888878} Future Appointments: Future Appointments  Date Time Provider Department Center  07/07/2024 10:10 AM Erik Kieth BROCKS, MD [facility]   Follow up Visit:   Please schedule this patient for a In person postpartum visit in 4 weeks with the following provider: Any provider. Additional Postpartum F/U:  Low risk pregnancy complicated by:  Delivery mode:  Vaginal, Spontaneous Anticipated Birth Control:  none   07/02/2024 Cathlean Ely, CNM

## 2024-07-02 NOTE — Anesthesia Procedure Notes (Signed)
 Epidural Patient location during procedure: OB Start time: 07/02/2024 4:38 AM End time: 07/02/2024 4:52 AM  Staffing Anesthesiologist: Jefm Garnette LABOR, MD Performed: anesthesiologist   Preanesthetic Checklist Completed: patient identified, IV checked, site marked, risks and benefits discussed, surgical consent, monitors and equipment checked, pre-op evaluation and timeout performed  Epidural Patient position: sitting Prep: DuraPrep and site prepped and draped Patient monitoring: continuous pulse ox and blood pressure Approach: midline Location: L3-L4 Injection technique: LOR air  Needle:  Needle type: Tuohy  Needle gauge: 17 G Needle length: 9 cm and 9 Needle insertion depth: 7 cm Catheter type: closed end flexible Catheter size: 19 Gauge Catheter at skin depth: 13 cm Test dose: negative  Assessment Events: blood not aspirated, no cerebrospinal fluid, injection not painful, no injection resistance, no paresthesia and negative IV test  Additional Notes Patient identified. Risks/Benefits/Options discussed with patient including but not limited to bleeding, infection, nerve damage, paralysis, failed block, incomplete pain control, headache, blood pressure changes, nausea, vomiting, reactions to medication both or allergic, itching and postpartum back pain. Confirmed with bedside nurse the patient's most recent platelet count. Confirmed with patient that they are not currently taking any anticoagulation, have any bleeding history or any family history of bleeding disorders. Patient expressed understanding and wished to proceed. All questions were answered. Sterile technique was used throughout the entire procedure. Please see nursing notes for vital signs. Test dose was given through epidural needle and negative prior to continuing to dose epidural or start infusion. Warning signs of high block given to the patient including shortness of breath, tingling/numbness in hands, complete  motor block, or any concerning symptoms with instructions to call for help. Patient was given instructions on fall risk and not to get out of bed. All questions and concerns addressed with instructions to call with any issues.  1 Attempt (S) . Patient tolerated procedure well.

## 2024-07-02 NOTE — MAU Provider Note (Signed)
 Kimberly Lewis is a 28 y.o. G19P2002 female at [redacted]w[redacted]d  Labor check, seen by  provider SVE by NP: Dilation: 5 Effacement (%): 80 Station: -3 Exam by:: Olam Dalton, NP NST: FHR baseline 130 bpm, Variability: moderate, Accelerations:present, Decelerations:  Absent= Cat 1/Reactive Toco: irregular, every 3-5 minutes  NST discontinuous at times due to patients discomfort   Patient rechecked 1 hour later and was found to be  6/80/-3 Intact   Pt informed that the ultrasound is considered a limited OB ultrasound and is not intended to be a complete ultrasound exam.  Patient also informed that the ultrasound is not being completed with the intent of assessing for fetal or placental anomalies or any pelvic abnormalities.  Explained that the purpose of today's ultrasound is to assess for  presentation.  Patient acknowledges the purpose of the exam and the limitations of the study.   VTX confirmed    Olam DELENA Dalton CNM, WHNP-BC 07/02/2024 2:27 AM

## 2024-07-03 LAB — BASIC METABOLIC PANEL WITH GFR
Anion gap: 8 (ref 5–15)
BUN: 8 mg/dL (ref 6–20)
CO2: 23 mmol/L (ref 22–32)
Calcium: 8.1 mg/dL — ABNORMAL LOW (ref 8.9–10.3)
Chloride: 106 mmol/L (ref 98–111)
Creatinine, Ser: 0.85 mg/dL (ref 0.44–1.00)
GFR, Estimated: >60 mL/min (ref >60)
Glucose, Bld: 87 mg/dL (ref 70–99)
Potassium: 3.6 mmol/L (ref 3.5–5.1)
Sodium: 137 mmol/L (ref 135–145)

## 2024-07-03 LAB — BIRTH TISSUE RECOVERY COLLECTION (PLACENTA DONATION)

## 2024-07-03 MED ORDER — ACETAMINOPHEN 500 MG PO TABS
1000.0000 mg | ORAL_TABLET | Freq: Four times a day (QID) | ORAL | Status: DC | PRN
  Administered 2024-07-03 – 2024-07-04 (×4): 1000 mg via ORAL
  Filled 2024-07-03 (×4): qty 2

## 2024-07-03 MED ORDER — OXYCODONE HCL 5 MG PO TABS
5.0000 mg | ORAL_TABLET | ORAL | Status: DC | PRN
  Administered 2024-07-03 – 2024-07-04 (×2): 5 mg via ORAL
  Filled 2024-07-03 (×2): qty 1

## 2024-07-03 MED ORDER — FUROSEMIDE 20 MG PO TABS
20.0000 mg | ORAL_TABLET | Freq: Every day | ORAL | Status: DC
  Administered 2024-07-03 – 2024-07-04 (×2): 20 mg via ORAL
  Filled 2024-07-03 (×2): qty 1

## 2024-07-03 MED ORDER — POTASSIUM CHLORIDE CRYS ER 20 MEQ PO TBCR
20.0000 meq | EXTENDED_RELEASE_TABLET | Freq: Two times a day (BID) | ORAL | Status: DC
  Administered 2024-07-03 – 2024-07-04 (×3): 20 meq via ORAL
  Filled 2024-07-03 (×3): qty 1

## 2024-07-03 NOTE — Progress Notes (Signed)
 MOB was referred for history of depression and PTSD. * Referral screened out by Clinical Social Worker because none of the following criteria appear to apply: ~ History of anxiety/depression during this pregnancy, or of post-partum depression following prior delivery. ~ Diagnosis of anxiety and/or depression within last 3 years Depression diagnosis noted 2019, PTSD diagnosis noted 2017. Per chart review, MOB denied mental health concerns in 2023. No noted history of postpartum depression (PPD); MOB denied hx of PPD following previous births in 2023.  OR * MOB's symptoms currently being treated with medication and/or therapy. Please contact the Clinical Social Worker if needs arise, by [facility] request, or if MOB scores greater than 9/yes to question 10 on Edinburgh Postpartum Depression Screen.  Signed,  Sharyne LOIS Roulette, MSW, LCSWA, LCASA 2024/08/28 8:53 AM

## 2024-07-03 NOTE — Progress Notes (Signed)
 Patient ID: Kimberly Lewis, female   DOB: 08-May-1996, 28 y.o.   MRN: [id number]  Post Partum Day One:S/P VD  Subjective: Patient up ad lib, denies syncope or dizziness. Reports consuming regular diet without issues and denies N/V. Denies issues with urination and reports bleeding is minimal.  Patient is breast feeding.  Desires partner vasectomy for postpartum contraception.  Pain is being appropriately managed with use of ibuprofen  & tylenol .  Objective: Vitals:   07/02/24 1413 07/02/24 1730 07/02/24 2144 07/03/24 0423  BP: 119/78 127/70 131/77 120/74  Pulse: 63 71 65 61  Resp: 18 17 18 18   Temp: 98.2 F (36.8 C) 98.5 F (36.9 C) 98.3 F (36.8 C) 98.5 F (36.9 C)  TempSrc: Oral Oral Oral Oral  SpO2:  100% 100% 100%  Weight:      Height:       Recent Labs    07/02/24 0407  HGB 14.6  HCT 42.1    Physical Exam:  General: alert, cooperative, appears stated age, and no distress Mood/Affect: normal, appropriate Lungs: normal respiratory effort  Uterine Fundus: firm Lochia: appropriate Laceration: n/a Skin: Warm, Dry DVT Evaluation: No evidence of DVT seen on physical exam.  Assessment S/P Vaginal Delivery-Day One Normal Involution BreastFeeding H/O PP HTN  Plan: -Lasix  and Kdur ordered for history and slightly elevated bps. -Continue current care. -L&D team to be updated on patient status.  -Plan discharge home tomorrow after clearance from peds  Rocky Satterfield, MSN, FNP 07/03/2024, 11:03 AM

## 2024-07-03 NOTE — Anesthesia Postprocedure Evaluation (Signed)
 Anesthesia Post Note  Patient: Shy Guallpa  Procedure(s) Performed: AN AD HOC LABOR EPIDURAL     Patient location during evaluation: Mother Baby Anesthesia Type: Epidural Level of consciousness: awake and alert and oriented Pain management: satisfactory to patient Vital Signs Assessment: post-procedure vital signs reviewed and stable Respiratory status: respiratory function stable Cardiovascular status: stable Postop Assessment: no headache, no backache, epidural receding, patient able to bend at knees, no signs of nausea or vomiting, adequate PO intake and able to ambulate Anesthetic complications: no   No notable events documented.  Last Vitals:  Vitals:   07/02/24 2144 07/03/24 0423  BP: 131/77 120/74  Pulse: 65 61  Resp: 18 18  Temp: 36.8 C 36.9 C  SpO2: 100% 100%    Last Pain:  Vitals:   07/03/24 0448  TempSrc:   PainSc: Asleep   Pain Goal:                   Cheryl Stabenow

## 2024-07-04 MED ORDER — ACETAMINOPHEN 500 MG PO TABS: 1000.0000 mg | ORAL_TABLET | Freq: Four times a day (QID) | ORAL | 0 refills | Status: AC | PRN

## 2024-07-04 MED ORDER — FERROUS SULFATE 325 (65 FE) MG PO TABS: 325.0000 mg | ORAL_TABLET | ORAL | 1 refills | Status: AC

## 2024-07-04 MED ORDER — FUROSEMIDE 20 MG PO TABS: 20.0000 mg | ORAL_TABLET | Freq: Every day | ORAL | 0 refills | Status: DC

## 2024-07-04 MED ORDER — POTASSIUM CHLORIDE CRYS ER 20 MEQ PO TBCR: 20.0000 meq | EXTENDED_RELEASE_TABLET | Freq: Two times a day (BID) | ORAL | 0 refills | Status: DC

## 2024-07-04 MED ORDER — POTASSIUM CHLORIDE CRYS ER 20 MEQ PO TBCR: 20.0000 meq | EXTENDED_RELEASE_TABLET | Freq: Every day | ORAL | 0 refills | Status: AC

## 2024-07-04 MED ORDER — OXYCODONE HCL 5 MG PO TABS: 5.0000 mg | ORAL_TABLET | ORAL | 0 refills | Status: AC | PRN

## 2024-07-04 MED ORDER — FUROSEMIDE 20 MG PO TABS: 20.0000 mg | ORAL_TABLET | Freq: Every day | ORAL | 0 refills | Status: AC

## 2024-07-04 NOTE — Discharge Instructions (Signed)

## 2024-07-04 NOTE — Progress Notes (Signed)
 Patient ID: Kimberly Lewis, female   DOB: 1996-06-05, 28 y.o.   MRN: [id number]  Post Partum Day Two:S/P Vaginal Delivery  Subjective: Patient up ad lib, denies syncope or dizziness. Reports consuming regular diet without issues and denies N/V. Denies issues with urination and reports bleeding is okay, really light and on and off.  Patient is breastfeeding and reports some anticipated nipple pain.  She reports back soreness' at the area of epidural site.  She states the pain is a 3-4/10 since oxycodone  dosing, but was 6-7/10 prior to that.  She states the pain is really really sore with walking and certain positions.  She further describes it was back labor.   Desires no medication for postpartum contraception.  Pain is being appropriately managed with use of oxycodone  and tylenol .  Objective: Vitals:   07/03/24 0423 07/03/24 1337 07/03/24 2058 07/04/24 0533  BP: 120/74 118/72 136/75 124/83  Pulse: 61 73 66 75  Resp: 18 19 20 20   Temp: 98.5 F (36.9 C) 98.4 F (36.9 C) 98.4 F (36.9 C) 98.4 F (36.9 C)  TempSrc: Oral Oral Oral Oral  SpO2: 100% 100% 100% 100%  Weight:      Height:       Recent Labs    07/02/24 0407  HGB 14.6  HCT 42.1    Physical Exam:  General: alert, cooperative, and no distress Mood/Affect: Appropriate/Appropriate Lungs: clear to auscultation, no wheezes, rales or rhonchi, symmetric air entry.  Heart: normal rate and regular rhythm. Breast: lactating, no erythema or tenderness, nipples normal. Abdomen:  + bowel sounds, Soft Uterine Fundus: firm, U/-2 Lochia: appropriate Laceration: N/A Skin: Warm, Dry Back: No erythema or swelling noted. Tenderness in mid to lower area bilaterally.  DVT Evaluation: No evidence of DVT seen on physical exam. No significant calf/ankle edema.  Assessment S/P Vaginal Delivery-Day Two Normal Involution BreastFeeding Back Pain  Plan: -Exam performed and patient informed no overt findings. -Reassured that some  discomfort is expected after epidural placement/removal.  -Will contact anesthesia regarding further assessment.  -Discharge education given. -Continue current care. -L&D team updated on patient status.   [name], MSN, CNM 07/04/2024, 7:42 AM

## 2024-07-04 NOTE — Lactation Note (Signed)
 This note was copied from a baby's chart. Lactation Consultation Note  Patient Name: Kimberly Lewis Unijb'd Date: 07/04/2024 Age:29 hours Reason for consult: Follow-up assessment;Early term 37-38.6wks  P3 mom stated that she is latching the baby then giving formula. Encouraged to tell staff she is latching for documentation. Suggested mom not to latch longer than 10 minutes d/t baby being less than 6 lbs. Then supplement w/total feeding not being longer than 10 min total. Mom stated she didn't know that. Suggested increasing supplemental amount. If baby doesn't take what is needed w/formula, let baby rest and before the hour is up for formula to expire try to get baby to take more. Mom is pumping and stated she can get a little bit now it is starting to increase some. Praised mom. Mom stated she doesn't have any questions at this time. Encouraged to call if needed.  Maternal Data    Feeding Mother's Current Feeding Choice: Breast Milk and Formula Nipple Type: Nfant Extra Slow Flow (gold)  LATCH Score                    Lactation Tools Discussed/Used Tools: Pump  Interventions    Discharge    Consult Status Consult Status: Follow-up Date: 07/04/24 Follow-up type: In-patient    Keyontay Stolz G 07/04/2024, 12:15 AM

## 2024-07-04 NOTE — Progress Notes (Signed)
 Evaluated patient this AM at the request of midwife for back pain.  Patient says she has persistent lower back soreness at epidural site. No radiation of pain, no associated lower extremity weakness or numbness. No documented fevers. Denies loss of bowel / bladder function. Epidural site looks c/d/I, almost impossible to see insertion site at all, sore to palpation.  I reassured patient that soreness at epidural insertion site is normal and can persist for a week or two while the body heals it. I advised that if she develops any of the above danger symptoms to seek urgent medical attention. Low concern currently for any serious pathology.

## 2024-07-05 NOTE — Lactation Note (Signed)
 This note was copied from a baby's chart. Lactation Consultation Note  Patient Name: Kimberly Lewis Unijb'd Date: 07/05/2024 Age:29 hours Reason for consult: Follow-up assessment;Infant < 6lbs;Maternal discharge. Change in weight -5.28 to -4.06% today in past 24 hours.   Per MOB, she thinking more of pumping only with infant she has latched infant to breast three times today. MOB is latching infant first and then supplementing infant with her EBM first and then 22 kcal formula. Per MOB when pumping she is now expressing 60 mls per pumping session and pumping every 3 hours for 15 minutes. MOB does have DEBP at home. MOB will continue to feed infant 8+ times per day and continue to supplement infant each feeding. LC did not observe latch due to infant recently being feed prior to [facility] entering the room.   Breastfeeding education: 1- MOB will continue to latch infant first every feeding, every 3 hours or sooner and limit feedings at breast to 15 minutes, afterwards MOB will continue to supplement infant with her EBM first and then formula. 2- MOB will continue to use her DEBP every 3 hours.  Breastfeeding discharge education: 3- LC discussed engorgement treatment and prevention. 4- LC discussed how to know if breastfeeding is going well in infant. 5- LC discussed warning signs of dehydration in infant.    Maternal Data    Feeding Mother's Current Feeding Choice: Breast Milk and Formula Nipple Type: Nfant Extra Slow Flow (gold)  LATCH Score                    Lactation Tools Discussed/Used Pumped volume: 60 mL (Per MOB, she is now expressing 60 mls per pumping session.)  Interventions Interventions: Skin to skin;Expressed milk;DEBP  Discharge Discharge Education: Engorgement and breast care;Warning signs for feeding baby Pump: DEBP;Personal  Consult Status Consult Status: Complete Date: 07/05/24 Follow-up type: Physician    Grayce LULLA Batter 07/05/2024, 12:59  PM

## 2024-07-07 ENCOUNTER — Encounter: Admitting: Obstetrics and Gynecology

## 2024-07-08 ENCOUNTER — Inpatient Hospital Stay (HOSPITAL_COMMUNITY)

## 2024-07-14 ENCOUNTER — Inpatient Hospital Stay (HOSPITAL_COMMUNITY): Admission: RE | Admit: 2024-07-14 | Payer: Self-pay | Source: Home / Self Care | Admitting: Family Medicine

## 2024-07-16 ENCOUNTER — Telehealth (HOSPITAL_COMMUNITY): Payer: Self-pay | Admitting: *Deleted

## 2024-07-16 NOTE — Telephone Encounter (Signed)
 07/16/2024  Name: Shine Scrogham MRN: [id number] DOB: 09/12/1996  Reason for Call:  Transition of Care Hospital Discharge Call  Contact Status: Patient Contact Status: Unable to contact (call cannot be completed as dialed)  Language assistant needed:          Follow-Up Questions:    Van Postnatal Depression Scale:  In the Past 7 Days:    PHQ2-9 Depression Scale:     Discharge Follow-up:    Post-discharge interventions: NA  Mliss Sieve, RN 07/16/2024 12:45

## 2024-08-02 ENCOUNTER — Ambulatory Visit (INDEPENDENT_AMBULATORY_CARE_PROVIDER_SITE_OTHER): Admitting: Obstetrics and Gynecology

## 2024-08-02 ENCOUNTER — Other Ambulatory Visit (HOSPITAL_COMMUNITY)
Admission: RE | Admit: 2024-08-02 | Discharge: 2024-08-02 | Disposition: A | Discharge status: Home / Self Care | Source: Ambulatory Visit | Attending: Obstetrics and Gynecology | Admitting: Obstetrics and Gynecology

## 2024-08-02 DIAGNOSIS — N393 Stress incontinence (female) (male): Secondary | ICD-10-CM | POA: Diagnosis not present

## 2024-08-02 DIAGNOSIS — Z6841 Body Mass Index (BMI) 40.0 and over, adult: Secondary | ICD-10-CM | POA: Diagnosis not present

## 2024-08-02 DIAGNOSIS — Z23 Encounter for immunization: Secondary | ICD-10-CM

## 2024-08-02 DIAGNOSIS — G8929 Other chronic pain: Secondary | ICD-10-CM

## 2024-08-02 DIAGNOSIS — E66813 Obesity, class 3: Secondary | ICD-10-CM

## 2024-08-02 DIAGNOSIS — M546 Pain in thoracic spine: Secondary | ICD-10-CM | POA: Diagnosis not present

## 2024-08-02 NOTE — Progress Notes (Signed)
 Post Partum Visit Note  Kimberly Lewis is a 28 y.o. G18P3003 female who presents for a postpartum visit. She is 4 weeks postpartum following a normal spontaneous vaginal delivery.  I have fully reviewed the prenatal and intrapartum course. The delivery was at 38 gestational weeks and 4 days.  Anesthesia: epidural. Postpartum course has been good. Baby is doing well. Baby is feeding by both breast and bottle - Similac Neosure. Bleeding no bleeding. Bowel function is normal. Bladder function is normal. Patient is not sexually active. Contraception method is vasectomy. Postpartum depression screening: negative.  Mid-back pain and SUI Pain worsens throughout the day, positional, worse w/ sitting   Upstream - 08/02/24 1046       Pregnancy Intention Screening   Does the patient want to become pregnant in the next year? No    Does the patient's partner want to become pregnant in the next year? No    Would the patient like to discuss contraceptive options today? No      Contraception Wrap Up   Current Method No Contraceptive Precautions    End Method Vasectomy    Contraception Counseling Provided No    How was the end contraceptive method provided? N/A         The pregnancy intention screening data noted above was reviewed. Potential methods of contraception were discussed. The patient elected to proceed with Vasectomy.   Edinburgh Postnatal Depression Scale - 08/02/24 1025       Edinburgh Postnatal Depression Scale:  In the Past 7 Days   I have been able to laugh and see the funny side of things. 0    I have looked forward with enjoyment to things. 0    I have blamed myself unnecessarily when things went wrong. 0    I have been anxious or worried for no good reason. 0    I have felt scared or panicky for no good reason. 0    Things have been getting on top of me. 0    I have been so unhappy that I have had difficulty sleeping. 0    I have felt sad or miserable. 0    I have been  so unhappy that I have been crying. 0    The thought of harming myself has occurred to me. 0    Edinburgh Postnatal Depression Scale Total 0         Health Maintenance Due  Topic Date Due   Hepatitis B Vaccines 19-59 Average Risk (1 of 3 - 19+ 3-dose series) Never done   Influenza Vaccine  Never done   COVID-19 Vaccine (1 - 2025-26 season) Never done   The following portions of the patient's history were reviewed and updated as appropriate: allergies, current medications, past family history, past medical history, past social history, past surgical history, and problem list.  Review of Systems Pertinent items are noted in HPI.  Objective:  BP 108/68   Pulse 77   Ht 5' 1 (1.549 m)   Wt 223 lb (101.2 kg)   LMP 10/06/2023   Breastfeeding Yes   BMI 42.14 kg/m    General:  alert, cooperative, and no distress  Lungs: Normal effort  Heart:  Regular rate  Abdomen: Soft, non tender   GU exam:  NEFG. Cervix visually normal without mass       Assessment:   Postpartum care and examination Recovering well Plans vasectomy, declines bridge method. Husband lost his job and is currently uninsured. Information  provided and will send chart to Dr. Ilean Declines flu shot  Chronic midline thoracic back pain Suspect MSK pain Discussed OTC topical agents to try Will resume PT -     Ambulatory referral to Physical Therapy  SUI (stress urinary incontinence, female) Discussed role for PFPT, she is accepting.  If PFPT does not address symptoms adequately can consider pessary or urogyn referral for surgical mangement -     Ambulatory referral to Physical Therapy  BMI 40.0-44.9, adult [facility]) Requested dietician referral to help with  -     Amb ref to Medical Nutrition Therapy-MNT  Need for HPV vaccination Pap also collected today (hx LSIL/HPV+) -     HPV 9-valent vaccine,Recombinat  Plan:   Essential components of care per ACOG recommendations:  1.  Mood and well being: Patient  with negative depression screening today. Reviewed local resources for support.  - Patient tobacco use? No.   - hx of drug use? No.    2. Infant care and feeding:  -Patient currently breastmilk feeding? Yes. Reviewed importance of draining breast regularly to support lactation.  -Social determinants of health (SDOH) reviewed in EPIC. No concerns  3. Sexuality, contraception and birth spacing - Patient does not want a pregnancy in the next year.  Desired family size is 3 children.  - Reviewed reproductive life planning. Reviewed contraceptive methods based on pt preferences and effectiveness.  Patient desired Vasectomy today.   - Discussed birth spacing of 18 months Declines bridge  4. Sleep and fatigue -Encouraged family/partner/community support of 4 hrs of uninterrupted sleep to help with mood and fatigue  5. Physical Recovery  - Discussed patients delivery and complications. She describes her labor as good. - Patient had a Vaginal, no problems at delivery. Patient had no laceration. Perineal healing reviewed. Patient expressed understanding - Patient has urinary incontinence? Yes. Offered PT and patient accepted. Patient was referred to pelvic floor PT.  - Patient is safe to resume physical and sexual activity  6.  Health Maintenance - HM due items addressed Yes - Last pap smear  Diagnosis  Date Value Ref Range Status  02/18/2022   Final   - Negative for intraepithelial lesion or malignancy (NILM)   Pap smear done at today's visit.  -Breast Cancer screening indicated? No.   7. Chronic Disease/Pregnancy Condition follow up:  See above  Kieth JAYSON Carolin, MD [facility], [facility]

## 2024-08-05 ENCOUNTER — Ambulatory Visit: Payer: Self-pay | Admitting: Obstetrics and Gynecology

## 2024-08-05 DIAGNOSIS — R87612 Low grade squamous intraepithelial lesion on cytologic smear of cervix (LGSIL): Secondary | ICD-10-CM

## 2024-08-05 LAB — CYTOLOGY - PAP
Comment: NEGATIVE
Diagnosis: NEGATIVE
Diagnosis: REACTIVE
High risk HPV: NEGATIVE

## 2024-09-17 ENCOUNTER — Other Ambulatory Visit: Payer: Self-pay

## 2024-09-17 ENCOUNTER — Ambulatory Visit
Admission: EM | Admit: 2024-09-17 | Discharge: 2024-09-17 | Disposition: A | Discharge status: Home / Self Care | Attending: Family Medicine | Admitting: Family Medicine

## 2024-09-17 DIAGNOSIS — J209 Acute bronchitis, unspecified: Secondary | ICD-10-CM

## 2024-09-17 MED ORDER — AZITHROMYCIN 250 MG PO TABS: ORAL_TABLET | ORAL | 0 refills | Status: AC

## 2024-09-17 NOTE — ED Provider Notes (Signed)
 " Kimberly Lewis    CSN: [id number] Arrival date & time: 09/17/24  1145      History   Chief Complaint Chief Complaint  Patient presents with   Cough    HPI Kimberly Lewis is a 29 y.o. female.   Patient states that she had a cold approximately 6 weeks ago.  Her head and nasal congestion cleared, but she has continued to cough.  Her productive cough is worse at night, and during the past week she has had mild anterior/inferior chest pain worse on the right.  During the past 3 days she has had chills and now occasional shortness of breath.  She has history of pneumonia 2021  The history is provided by the patient.    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 Authorizing Provider  azithromycin  (ZITHROMAX  Z-PAK) 250 MG tablet Take 2 tabs today; then begin one tab once daily for 4 more days. 09/17/24  Yes Pauline Garnette LABOR, MD  acetaminophen  (TYLENOL ) 500 MG tablet Take 2 tablets (1,000 mg total) by mouth every 6 (six) hours as needed (for pain scale < 4). 07/04/24   Synthia Raisin, CNM  famotidine  (PEPCID ) 20 MG tablet Take 1 tablet (20 mg total) by mouth  2 (two) times daily. 01/28/24   Cleatus Moccasin, MD  ferrous sulfate  325 (65 FE) MG tablet Take 1 tablet (325 mg total) by mouth every other day. 07/04/24   Synthia Raisin, CNM  furosemide  (LASIX ) 20 MG tablet Take 1 tablet (20 mg total) by mouth daily. 07/04/24   Anyanwu, Ugonna A, MD  oxyCODONE  (OXY IR/ROXICODONE ) 5 MG immediate release tablet Take 1-2 tablets (5-10 mg total) by mouth every 4 (four) hours as needed for severe pain (pain score 7-10) or breakthrough pain. 07/04/24   Synthia Raisin, CNM  potassium chloride  SA (KLOR-CON  M) 20 MEQ tablet Take 1 tablet (20 mEq total) by mouth daily. 07/04/24   Anyanwu, Ugonna A, MD  Prenatal Vit-Fe Fumarate-FA (PREPLUS) 27-1 MG TABS Take 1 tablet by mouth daily. 01/26/24   Warren-Hill, Camie LABOR, CNM    Family History Family History  Problem Relation Age of Onset   Healthy Mother    Other Father        unknown   Hypertension Maternal Grandmother    Cancer Maternal Grandfather    Cancer Paternal Grandmother  Diabetes Other    Asthma Neg Hx    Heart disease Neg Hx    Stroke Neg Hx     Social History Social History[1]   Allergies   Aspirin  and Amoxicillin   Review of Systems Review of Systems No sore throat + cough ? pleuritic pain No wheezing No nasal congestion No post-nasal drainage No sinus pain/pressure No itchy/red eyes No earache No hemoptysis + SOB No fever/+ chills No nausea No vomiting No abdominal pain + diarrhea, resolving No urinary symptoms No skin rash + fatigue No myalgias No headache   Physical Exam Triage Vital Signs ED Triage Vitals  Encounter Vitals Group     BP 09/17/24 1219 111/74     Girls Systolic BP Percentile --      Girls Diastolic BP Percentile --      Boys Systolic BP Percentile --      Boys Diastolic BP Percentile --      Pulse Rate 09/17/24 1219 73     Resp 09/17/24 1219 20     Temp 09/17/24 1219 98.7 F (37.1 C)     Temp Source 09/17/24 1219 Oral     SpO2 09/17/24 1219 96 %      Weight 09/17/24 1222 220 lb (99.8 kg)     Height 09/17/24 1222 5' 1 (1.549 m)     Head Circumference --      Peak Flow --      Pain Score 09/17/24 1221 7     Pain Loc --      Pain Education --      Exclude from Growth Chart --    No data found.  Updated Vital Signs BP 111/74 (BP Location: Right Arm)   Pulse 73   Temp 98.7 F (37.1 C) (Oral)   Resp 20   Ht 5' 1 (1.549 m)   Wt 99.8 kg   LMP 09/07/2024   SpO2 96%   BMI 41.57 kg/m   Visual Acuity Right Eye Distance:   Left Eye Distance:   Bilateral Distance:    Right Eye Near:   Left Eye Near:    Bilateral Near:     Physical Exam Nursing notes and Vital Signs reviewed. Appearance:  Patient appears stated age, and in no acute distress Eyes:  Pupils are equal, round, and reactive to light and accomodation.  Extraocular movement is intact.  Conjunctivae are not inflamed  Ears:  Canals normal.  Tympanic membranes normal.  Nose:  Normal turbinates.  No sinus tenderness.  Pharynx:  Normal Neck:  Supple.  Mildly enlarged lateral nodes are present, tender to palpation on the left.  Palpation of chest reveals mild tenderness to palpation anterior/inferior ribs. Lungs:  Clear to auscultation.  Breath sounds are equal.  Moving air well. Heart:  Regular rate and rhythm without murmurs, rubs, or gallops.  Abdomen:  Nontender without masses or hepatosplenomegaly.  Bowel sounds are present.  No CVA or flank tenderness.  Extremities:  No edema.  Skin:  No rash present.   UC Treatments / Results  Labs (all labs ordered are listed, but only abnormal results are displayed) Labs Reviewed - No data to display  EKG   Radiology No results found.  Procedures Procedures (including critical Lewis time)  Medications Ordered in UC Medications - No data to display  Initial Impression / Assessment and Plan / UC Course  I have reviewed the triage vital signs and the nursing notes.  Pertinent labs & imaging results that were available  during my Lewis of the patient were reviewed by me and considered in my medical decision making (see chart for details).    Begin Z-pack. Followup with Family Doctor if not improved in one week.   Final Clinical Impressions(s) / UC Diagnoses   Final diagnoses:  Acute bronchitis, unspecified organism     Discharge Instructions      Take plain guaifenesin (1200mg  extended release tabs such as Mucinex) twice daily, with plenty of water, for cough and congestion.  Get adequate rest.   May take Delsym Cough Suppressant (12 Hour Cough Relief) at bedtime for nighttime cough.  Stop all antihistamines for now, and other non-prescription cough/cold preparations.   If symptoms become significantly worse during the night or over the weekend, proceed to the local emergency room.     ED Prescriptions     Medication Sig Dispense Auth. Provider   azithromycin  (ZITHROMAX  Z-PAK) 250 MG tablet Take 2 tabs today; then begin one tab once daily for 4 more days. 6 tablet Pauline Garnette LABOR, MD           [1]  Social History Tobacco Use   Smoking status: Former    Types: Cigarettes    Passive exposure: Past  Vaping Use   Vaping status: Never Used  Substance Use Topics   Alcohol use: Not Currently   Drug use: Not Currently     Pauline Garnette LABOR, MD 09/19/24 1553  "

## 2024-09-17 NOTE — ED Triage Notes (Signed)
 Pt presenting with c/o non productive cough, chest congestion  and upper back pain x 2 weeks. Pt stated she has been exposed to family members who had cold symptoms 2 months ago and she contracted  same however the cough and chest congestion persist. Pt stated she had episodes of diarrhea x 3 days which has decreased today. No medication taken for cough.  Liquid po Electrolyte taken for diarrhea with minimal effectiveness.

## 2024-09-17 NOTE — Discharge Instructions (Signed)
Take plain guaifenesin (1200mg  extended release tabs such as Mucinex) twice daily, with plenty of water, for cough and congestion.   Get adequate rest.   May take Delsym Cough Suppressant ("12 Hour Cough Relief") at bedtime for nighttime cough.  Stop all antihistamines for now, and other non-prescription cough/cold preparations.     If symptoms become significantly worse during the night or over the weekend, proceed to the local emergency room.
